# Patient Record
Sex: Female | Born: 1950 | Hispanic: No | Smoking: Never smoker
Health system: Southern US, Community
[De-identification: ages and names within clinical notes are randomized; demographics above are authoritative.]

## PROBLEM LIST (undated history)

## (undated) DIAGNOSIS — I639 Cerebral infarction, unspecified: Secondary | ICD-10-CM

## (undated) DIAGNOSIS — D649 Anemia, unspecified: Secondary | ICD-10-CM

## (undated) DIAGNOSIS — N189 Chronic kidney disease, unspecified: Secondary | ICD-10-CM

## (undated) DIAGNOSIS — K219 Gastro-esophageal reflux disease without esophagitis: Secondary | ICD-10-CM

## (undated) DIAGNOSIS — E119 Type 2 diabetes mellitus without complications: Secondary | ICD-10-CM

## (undated) HISTORY — PX: APPENDECTOMY: SHX54

## (undated) HISTORY — PX: EYE SURGERY: SHX253

---

## 2000-12-30 ENCOUNTER — Emergency Department (HOSPITAL_COMMUNITY): Admission: EM | Admit: 2000-12-30 | Discharge: 2000-12-30 | Payer: Self-pay | Admitting: *Deleted

## 2006-04-18 ENCOUNTER — Inpatient Hospital Stay (HOSPITAL_COMMUNITY): Admission: EM | Admit: 2006-04-18 | Discharge: 2006-04-20 | Payer: Self-pay | Admitting: Emergency Medicine

## 2014-11-22 DIAGNOSIS — E11341 Type 2 diabetes mellitus with severe nonproliferative diabetic retinopathy with macular edema: Secondary | ICD-10-CM | POA: Diagnosis not present

## 2015-01-05 DIAGNOSIS — E11341 Type 2 diabetes mellitus with severe nonproliferative diabetic retinopathy with macular edema: Secondary | ICD-10-CM | POA: Diagnosis not present

## 2015-01-10 DIAGNOSIS — E11341 Type 2 diabetes mellitus with severe nonproliferative diabetic retinopathy with macular edema: Secondary | ICD-10-CM | POA: Diagnosis not present

## 2015-01-31 DIAGNOSIS — E113512 Type 2 diabetes mellitus with proliferative diabetic retinopathy with macular edema, left eye: Secondary | ICD-10-CM | POA: Diagnosis not present

## 2015-02-15 DIAGNOSIS — E113411 Type 2 diabetes mellitus with severe nonproliferative diabetic retinopathy with macular edema, right eye: Secondary | ICD-10-CM | POA: Diagnosis not present

## 2015-02-20 ENCOUNTER — Encounter (HOSPITAL_COMMUNITY): Payer: Self-pay | Admitting: *Deleted

## 2015-02-20 ENCOUNTER — Emergency Department (HOSPITAL_COMMUNITY)
Admission: EM | Admit: 2015-02-20 | Discharge: 2015-02-20 | Disposition: A | Discharge status: Home / Self Care | Payer: Self-pay | Attending: Emergency Medicine | Admitting: Emergency Medicine

## 2015-02-20 ENCOUNTER — Emergency Department (HOSPITAL_COMMUNITY): Payer: Self-pay

## 2015-02-20 DIAGNOSIS — Y998 Other external cause status: Secondary | ICD-10-CM | POA: Insufficient documentation

## 2015-02-20 DIAGNOSIS — E119 Type 2 diabetes mellitus without complications: Secondary | ICD-10-CM | POA: Insufficient documentation

## 2015-02-20 DIAGNOSIS — S42301A Unspecified fracture of shaft of humerus, right arm, initial encounter for closed fracture: Secondary | ICD-10-CM | POA: Insufficient documentation

## 2015-02-20 DIAGNOSIS — W010XXA Fall on same level from slipping, tripping and stumbling without subsequent striking against object, initial encounter: Secondary | ICD-10-CM | POA: Insufficient documentation

## 2015-02-20 DIAGNOSIS — Y9289 Other specified places as the place of occurrence of the external cause: Secondary | ICD-10-CM | POA: Insufficient documentation

## 2015-02-20 DIAGNOSIS — Y9389 Activity, other specified: Secondary | ICD-10-CM | POA: Insufficient documentation

## 2015-02-20 DIAGNOSIS — S01111A Laceration without foreign body of right eyelid and periocular area, initial encounter: Secondary | ICD-10-CM | POA: Insufficient documentation

## 2015-02-20 HISTORY — DX: Type 2 diabetes mellitus without complications: E11.9

## 2015-02-20 LAB — URINALYSIS, ROUTINE W REFLEX MICROSCOPIC
BILIRUBIN URINE: NEGATIVE
Glucose, UA: >1000 mg/dL — AB
Ketones, ur: NEGATIVE mg/dL
Leukocytes, UA: NEGATIVE
NITRITE: NEGATIVE
SPECIFIC GRAVITY, URINE: 1.013 (ref 1.005–1.030)
UROBILINOGEN UA: 0.2 mg/dL (ref 0.0–1.0)
pH: 7 (ref 5.0–8.0)

## 2015-02-20 LAB — CBC WITH DIFFERENTIAL/PLATELET
BASOS ABS: 0 10*3/uL (ref 0.0–0.1)
BASOS PCT: 0 %
EOS ABS: 0.1 10*3/uL (ref 0.0–0.7)
Eosinophils Relative: 2 %
HEMATOCRIT: 30.3 % — AB (ref 36.0–46.0)
HEMOGLOBIN: 10 g/dL — AB (ref 12.0–15.0)
Lymphocytes Relative: 23 %
Lymphs Abs: 1.1 10*3/uL (ref 0.7–4.0)
MCH: 29.1 pg (ref 26.0–34.0)
MCHC: 33 g/dL (ref 30.0–36.0)
MCV: 88.1 fL (ref 78.0–100.0)
Monocytes Absolute: 0.3 10*3/uL (ref 0.1–1.0)
Monocytes Relative: 6 %
NEUTROS ABS: 3.4 10*3/uL (ref 1.7–7.7)
NEUTROS PCT: 69 %
Platelets: 333 10*3/uL (ref 150–400)
RBC: 3.44 MIL/uL — ABNORMAL LOW (ref 3.87–5.11)
RDW: 13.3 % (ref 11.5–15.5)
WBC: 4.9 10*3/uL (ref 4.0–10.5)

## 2015-02-20 LAB — BASIC METABOLIC PANEL
ANION GAP: 6 (ref 5–15)
BUN: 26 mg/dL — ABNORMAL HIGH (ref 6–20)
CALCIUM: 9 mg/dL (ref 8.9–10.3)
CHLORIDE: 110 mmol/L (ref 101–111)
CO2: 24 mmol/L (ref 22–32)
Creatinine, Ser: 1.32 mg/dL — ABNORMAL HIGH (ref 0.44–1.00)
GFR, EST AFRICAN AMERICAN: 48 mL/min — AB (ref >60)
GFR, EST NON AFRICAN AMERICAN: 41 mL/min — AB (ref >60)
Glucose, Bld: 228 mg/dL — ABNORMAL HIGH (ref 65–99)
Potassium: 4.4 mmol/L (ref 3.5–5.1)
Sodium: 140 mmol/L (ref 135–145)

## 2015-02-20 LAB — URINE MICROSCOPIC-ADD ON

## 2015-02-20 MED ORDER — TRAMADOL HCL 50 MG PO TABS: 50.0000 mg | ORAL_TABLET | Freq: Four times a day (QID) | ORAL | Status: DC | PRN

## 2015-02-20 MED ORDER — SODIUM CHLORIDE 0.9 % IV BOLUS (SEPSIS)
1000.0000 mL | Freq: Once | INTRAVENOUS | Status: AC
  Administered 2015-02-20: 1000 mL via INTRAVENOUS

## 2015-02-20 NOTE — Progress Notes (Signed)
Patient listed as not having insurance or a pcp.  EDCM spoke to patient at bedside.  Patient has adult family member at bedside translating as patient speaks [place].  Patient reports she has [employer] insurance.  Patient reports she has a pcp but she cannot remember her name.

## 2015-02-20 NOTE — ED Notes (Signed)
Pt arrives to the ER via EMS s/p fall at [facility]; pt states that she tripped over a rolling cart and caught herself with her rt arm; pt c/o rt upper arm pain; pt with small laceration to rt eye; pt denies LOC; pt with bruising to rt shin as well; pt with knot to rt wrist area, pt advises that this is an old injury from 971992; pt c/o pain to rt upper arm shoulder area, worse with movement; + pulse, + sensation

## 2015-02-20 NOTE — Discharge Instructions (Signed)
Fractura del hmero, tratada con inmovilizacin (Humerus Fracture Treated With Immobilization) El hmero es el hueso grande que se encuentra en la parte superior del brazo. Usted ha sufrido una ruptura de un hueso (fractura) en el hmero. Estas fracturas se diagnostican fcilmente con radiografas. TRATAMIENTO Las fracturas simples que se curan sin peligro de conducir a una discapacidad, se tratan con la simple inmovilizacin. Inmovilizacin significa que deber usar un yeso, una frula o un cabestrillo. Usted ha sufrido una fractura que se curar bien con inmovilizacin. La fractura se curar simplemente colocando el hueso en una buena posicin hasta que est lo suficientemente estable como para comenzar con los ejercicios de amplitud de movimientos. No realice actividades que puedan lesionar an ms el brazo.  INSTRUCCIONES PARA EL CUIDADO DOMICILIARIO  Aplique hielo sobre la zona lesionada.  Ponga el hielo en una bolsa plstica.  Colquese una toalla entre la piel y la bolsa de hielo.  Deje el hielo durante 15 a 20 minutos, 3 a 4 veces por da.  Si tiene un yeso:  No trate de rascarse la piel por debajo del molde utilizando objetos filosos o puntiagudos.  Controle todos los Darden Restaurantsdas la piel de alrededor del yeso. Puede colocarse una locin en las zonas rojas o doloridas.  Mantenga el yeso seco y limpio.  Si tiene una frula:  sela del modo en que se lo indicaron.  Mantenga la tablilla seco y limpio.  Puede aflojar el elstico que rodea la tablilla si los dedos se entumecen, siente hormigueos, se enfran o se vuelven de color azul.  Si tiene un cabestrillo:  Use el cabestrillo del modo en que se lo indicaron.  No ejerza presin en ninguna parte del yeso o tablilla hasta que se haya endurecido.  Puede proteger el yeso o la tablilla durante el bao con una bolsa plstica. No los sumerja en el agua.  Utilice los medicamentos de venta libre o de prescripcin para Chief Technology Officerel dolor, Copywriter, advertisingel  malestar o la [place]bre, segn se lo indique el profesional que lo asiste.  Haga ejercicios con amplitud de movimientos, segn se lo haya indicado el profesional.  Realice el seguimiento segn las instrucciones que le ha dado el profesional que lo asiste. Esto es muy importante para evitar una lesin o discapacidad permanente y el dolor crnico. SOLICITE ATENCIN MDICA DE INMEDIATO SI:  La piel o las uas del brazo lesionado se vuelven azules o grises.  Siente el brazo fro o entumecido.  Aumenta el dolor en la zona de la herida.  Tiene problemas con los medicamentos que le recetaron. EST SEGURO QUE:   Comprende las instrucciones para el alta mdica.  Controlar su enfermedad.  Solicitar atencin mdica de inmediato segn las indicaciones.   Esta informacin no tiene Theme park managercomo fin reemplazar el consejo del mdico. Asegrese de hacerle al mdico cualquier pregunta que tenga.   Document Released: 04/07/2005 Document Revised: 04/28/2014 Elsevier Interactive Patient Education [employer]2016 Elsevier Inc.

## 2015-02-20 NOTE — ED Provider Notes (Signed)
CSN: [id number]     Arrival date & time 02/20/15  1931 History   First MD Initiated Contact with Patient 02/20/15 1942     Chief Complaint  Patient presents with  . Fall  . Arm Pain     (Consider location/radiation/quality/duration/timing/severity/associated sxs/prior Treatment) HPI Patient presents after mechanical fall. Patient recalls the entirety of the event.  She states that she lost her balance, fell onto her right side. Since the event she has had no loss of consciousness, confusion, disorientation, weakness anywhere. She does have severe pain in the right shoulder. There is minimal pain in the right. Patient has not been ambulatory since the initial event.  No medication taken for pain relief. Past Medical History  Diagnosis Date  . Diabetes mellitus without complication [facility](HCC)    Past Surgical History  Procedure Laterality Date  . Eye surgery     No family history on file. Social History  Substance Use Topics  . Smoking status: Never Smoker   . Smokeless tobacco: None  . Alcohol Use: No   OB History    No data available     Review of Systems  Constitutional:       Per HPI, otherwise negative  HENT:       Per HPI, otherwise negative  Respiratory:       Per HPI, otherwise negative  Cardiovascular:       Per HPI, otherwise negative  Gastrointestinal: Negative for vomiting.  Endocrine:       Negative aside from HPI  Genitourinary:       Neg aside from HPI   Musculoskeletal:       Per HPI, otherwise negative  Skin: Positive for wound.  Neurological: Negative for syncope and weakness.      Allergies  Review of patient's allergies indicates no known allergies.  Home Medications   Prior to Admission medications   Not on File   BP 178/83 mmHg  Pulse 100  Temp(Src) 98 F (36.7 C) (Oral)  Resp 16  SpO2 99% Physical Exam  Constitutional: She is oriented to person, place, and time. She appears well-developed and well-nourished. No distress.    HENT:  Head: Normocephalic and atraumatic.  Eyes: Conjunctivae and EOM are normal.  Neck: No spinous process tenderness and no muscular tenderness present. No rigidity. No edema, no erythema and normal range of motion present.  Cardiovascular: Normal rate and regular rhythm.   Pulmonary/Chest: Effort normal and breath sounds normal. No stridor. No respiratory distress.  Abdominal: She exhibits no distension.  Musculoskeletal: She exhibits no edema.       Right elbow: Normal.      Arms: Neurological: She is alert and oriented to person, place, and time. No cranial nerve deficit.  Skin: Skin is warm and dry.     Psychiatric: She has a normal mood and affect.  Nursing note and vitals reviewed.   ED Course  Procedures (including critical care time) Labs Review Labs Reviewed  BASIC METABOLIC PANEL - Abnormal; Notable for the following:    Glucose, Bld 228 (*)    BUN 26 (*)    Creatinine, Ser 1.32 (*)    GFR calc non Af Amer 41 (*)    GFR calc Af Amer 48 (*)    All other components within normal limits  CBC WITH DIFFERENTIAL/PLATELET - Abnormal; Notable for the following:    RBC 3.44 (*)    Hemoglobin 10.0 (*)    HCT 30.3 (*)    All other  components within normal limits  URINALYSIS, ROUTINE W REFLEX MICROSCOPIC (NOT AT [facility])    Imaging Review Dg Shoulder Right  02/20/2015  CLINICAL DATA:  Trip and fall injury. Right upper arm pain with knot in the right wrist area. Old injury from 59. EXAM: RIGHT SHOULDER - 2+ VIEW COMPARISON:  None. FINDINGS: Limited two view shoulder series demonstrates acute fracture of the right humeral head and neck with fracture lines extending along the greater tuberosity and surgical neck of the humerus. There is probable impaction of fracture fragments with small displaced fragments. No definite dislocation of the shoulder. Visualized right ribs appear intact. IMPRESSION: Comminuted fracture of the proximal right humerus with impaction and mild  displacement of fracture fragments. Electronically Signed   By: Burman Nieves M.D.   On: 02/20/2015 20:52   Dg Wrist Complete Right  02/20/2015  CLINICAL DATA:  64 year old who fell at the stool or, tripping over a cart, landing on the outstretched right arm. Initial encounter. Remote right wrist injury in 1992. EXAM: RIGHT WRIST - COMPLETE 3+ VIEW COMPARISON:  None. FINDINGS: Prior solid osseous fusion of the entire carpus, the radiocarpal joint, and the 2nd through 5th carpal-metacarpal joints. No evidence of acute fracture or dislocation. As a result of the fusion, the ulna protrudes dorsally more than usual. IMPRESSION: 1. No acute osseous abnormality. 2. Prior solid right wrist fusion as described above. Electronically Signed   By: Hulan Saas M.D.   On: 02/20/2015 20:54   I have personally reviewed and evaluated these images and lab results as part of my medical decision-making.  On repeat exam the patient is in no distress we reviewed the x-rays together.  Patient received sling in the emergency department, this was well tolerated.  MDM  Patient presents after mechanical fall with pain in her right shoulder. Patient is found to have right proximal humerus fracture. Patient is awake, alert, neurologically intact 4 hours of monitoring here, and labs are largely reassuring, though there is some evidence for mild dehydration. Patient discharged in stable condition to follow-up with primary care in orthopedics.  Gerhard Munch, MD 02/20/15 2257

## 2015-02-27 DIAGNOSIS — S42291A Other displaced fracture of upper end of right humerus, initial encounter for closed fracture: Secondary | ICD-10-CM | POA: Diagnosis not present

## 2015-03-13 DIAGNOSIS — S42291D Other displaced fracture of upper end of right humerus, subsequent encounter for fracture with routine healing: Secondary | ICD-10-CM | POA: Diagnosis not present

## 2016-01-08 DIAGNOSIS — E113512 Type 2 diabetes mellitus with proliferative diabetic retinopathy with macular edema, left eye: Secondary | ICD-10-CM | POA: Diagnosis not present

## 2016-01-08 DIAGNOSIS — Z961 Presence of intraocular lens: Secondary | ICD-10-CM | POA: Diagnosis not present

## 2016-01-08 DIAGNOSIS — E113511 Type 2 diabetes mellitus with proliferative diabetic retinopathy with macular edema, right eye: Secondary | ICD-10-CM | POA: Diagnosis not present

## 2016-01-08 DIAGNOSIS — H359 Unspecified retinal disorder: Secondary | ICD-10-CM | POA: Diagnosis not present

## 2016-07-08 ENCOUNTER — Encounter (HOSPITAL_COMMUNITY): Payer: Self-pay | Admitting: *Deleted

## 2016-07-08 ENCOUNTER — Emergency Department (HOSPITAL_COMMUNITY)
Admission: EM | Admit: 2016-07-08 | Discharge: 2016-07-08 | Disposition: A | Discharge status: Home / Self Care | Payer: Medicare Other | Attending: Emergency Medicine | Admitting: Emergency Medicine

## 2016-07-08 DIAGNOSIS — E1165 Type 2 diabetes mellitus with hyperglycemia: Secondary | ICD-10-CM | POA: Insufficient documentation

## 2016-07-08 DIAGNOSIS — R739 Hyperglycemia, unspecified: Secondary | ICD-10-CM

## 2016-07-08 LAB — BASIC METABOLIC PANEL
Anion gap: 11 (ref 5–15)
BUN: 56 mg/dL — AB (ref 6–20)
CALCIUM: 8.6 mg/dL — AB (ref 8.9–10.3)
CO2: 21 mmol/L — AB (ref 22–32)
Chloride: 99 mmol/L — ABNORMAL LOW (ref 101–111)
Creatinine, Ser: 2.13 mg/dL — ABNORMAL HIGH (ref 0.44–1.00)
GFR calc Af Amer: 26 mL/min — ABNORMAL LOW (ref >60)
GFR, EST NON AFRICAN AMERICAN: 23 mL/min — AB (ref >60)
GLUCOSE: 670 mg/dL — AB (ref 65–99)
POTASSIUM: 4 mmol/L (ref 3.5–5.1)
Sodium: 131 mmol/L — ABNORMAL LOW (ref 135–145)

## 2016-07-08 LAB — URINALYSIS, ROUTINE W REFLEX MICROSCOPIC
Bilirubin Urine: NEGATIVE
Glucose, UA: >=500 mg/dL — AB
HGB URINE DIPSTICK: NEGATIVE
Ketones, ur: NEGATIVE mg/dL
Leukocytes, UA: NEGATIVE
NITRITE: NEGATIVE
SPECIFIC GRAVITY, URINE: 1.02 (ref 1.005–1.030)
pH: 6 (ref 5.0–8.0)

## 2016-07-08 LAB — I-STAT CHEM 8, ED
BUN: 54 mg/dL — ABNORMAL HIGH (ref 6–20)
CHLORIDE: 106 mmol/L (ref 101–111)
Calcium, Ion: 1.13 mmol/L — ABNORMAL LOW (ref 1.15–1.40)
Creatinine, Ser: 2 mg/dL — ABNORMAL HIGH (ref 0.44–1.00)
Glucose, Bld: 371 mg/dL — ABNORMAL HIGH (ref 65–99)
HEMATOCRIT: 21 % — AB (ref 36.0–46.0)
HEMOGLOBIN: 7.1 g/dL — AB (ref 12.0–15.0)
POTASSIUM: 4.1 mmol/L (ref 3.5–5.1)
SODIUM: 136 mmol/L (ref 135–145)
TCO2: 23 mmol/L (ref 0–100)

## 2016-07-08 LAB — CBC
HEMATOCRIT: 27.8 % — AB (ref 36.0–46.0)
Hemoglobin: 9.4 g/dL — ABNORMAL LOW (ref 12.0–15.0)
MCH: 28.2 pg (ref 26.0–34.0)
MCHC: 33.8 g/dL (ref 30.0–36.0)
MCV: 83.5 fL (ref 78.0–100.0)
Platelets: 383 10*3/uL (ref 150–400)
RBC: 3.33 MIL/uL — ABNORMAL LOW (ref 3.87–5.11)
RDW: 13 % (ref 11.5–15.5)
WBC: 7.4 10*3/uL (ref 4.0–10.5)

## 2016-07-08 LAB — CBG MONITORING, ED
GLUCOSE-CAPILLARY: 573 mg/dL — AB (ref 65–99)
GLUCOSE-CAPILLARY: 361 mg/dL — AB (ref 65–99)

## 2016-07-08 MED ORDER — SODIUM CHLORIDE 0.9 % IV BOLUS (SEPSIS)
2000.0000 mL | Freq: Once | INTRAVENOUS | Status: AC
  Administered 2016-07-08: 2000 mL via INTRAVENOUS

## 2016-07-08 MED ORDER — METFORMIN HCL 500 MG PO TABS: 500.0000 mg | ORAL_TABLET | Freq: Two times a day (BID) | ORAL | 0 refills | Status: DC

## 2016-07-08 MED ORDER — INSULIN ASPART 100 UNIT/ML ~~LOC~~ SOLN
10.0000 [IU] | Freq: Once | SUBCUTANEOUS | Status: AC
  Administered 2016-07-08: 10 [IU] via INTRAVENOUS
  Filled 2016-07-08: qty 1

## 2016-07-08 NOTE — ED Triage Notes (Signed)
Daughter states patient went to the PCP yest for routine check up and had labs drawn, was called this am and told to come to the hospital for admission for elevated blood sugar. Patient doesn't speak any english

## 2016-07-08 NOTE — ED Notes (Signed)
CBG 361 

## 2016-07-08 NOTE — ED Notes (Signed)
Checked patient blood sugar it read over 500 notified RN Tresa EndoKelly

## 2016-07-08 NOTE — ED Provider Notes (Signed)
[facility]-EMERGENCY DEPT Provider Note   CSN: [id number] Arrival date & time: 07/08/16  4098     History   Chief Complaint Chief Complaint  Patient presents with  . Hyperglycemia    HPI Frances Reeves is a 66 y.o. female.  HPI  Patient presents with concern of hyperglycemia. Patient states that she has one medical problem, diabetes. Patient has not taken metformin in about one year, since social circumstances changed. Today the patient had a clinic appointment, arranged by social services. She is found be hyper glycemic there, sent here for evaluation. She denies any ongoing complaints, including fatigue, nausea, confusion, vomiting, diarrhea, fever.   Past Medical History:  Diagnosis Date  . Diabetes mellitus without complication (HCC)     There are no active problems to display for this patient.   Past Surgical History:  Procedure Laterality Date  . EYE SURGERY      OB History    No data available       Home Medications    Prior to Admission medications   Medication Sig Start Date End Date Taking? Authorizing Provider  metFORMIN (GLUCOPHAGE) 500 MG tablet Take 500 mg by mouth 2 (two) times daily.    Historical Provider, MD  traMADol (ULTRAM) 50 MG tablet Take 1 tablet (50 mg total) by mouth every 6 (six) hours as needed. Patient not taking: Reported on 07/08/2016 02/20/15   Gerhard Munch, MD    Family History History reviewed. No pertinent family history.  Social History Social History  Substance Use Topics  . Smoking status: Never Smoker  . Smokeless tobacco: Never Used  . Alcohol use No     Allergies   Patient has no known allergies.   Review of Systems Review of Systems  Constitutional:       Per HPI, otherwise negative  HENT:       Per HPI, otherwise negative  Respiratory:       Per HPI, otherwise negative  Cardiovascular:       Per HPI, otherwise negative  Gastrointestinal: Negative for vomiting.  Endocrine:       Negative  aside from HPI  Genitourinary:       Neg aside from HPI   Musculoskeletal:       Per HPI, otherwise negative  Skin: Negative.   Neurological: Negative for syncope.     Physical Exam Updated Vital Signs BP (!) 163/62   Pulse 97   Temp 98.8 F (37.1 C) (Oral)   Resp (!) 21   Wt 90 lb (40.8 kg)   SpO2 100%   Physical Exam  Constitutional: She is oriented to person, place, and time. No distress.  Thin elderly female awake, alert, speaking clearly, answering questions appropriately  HENT:  Head: Normocephalic and atraumatic.  Eyes: Conjunctivae and EOM are normal.  Cardiovascular: Normal rate and regular rhythm.   Pulmonary/Chest: Effort normal and breath sounds normal. No stridor. No respiratory distress.  Abdominal: She exhibits no distension.  Musculoskeletal: She exhibits no edema.  Neurological: She is alert and oriented to person, place, and time. No cranial nerve deficit.  Skin: Skin is warm and dry.  Psychiatric: She has a normal mood and affect.  Nursing note and vitals reviewed.    ED Treatments / Results  Labs (all labs ordered are listed, but only abnormal results are displayed) Labs Reviewed  BASIC METABOLIC PANEL - Abnormal; Notable for the following:       Result Value   Sodium 131 (*)  Chloride 99 (*)    CO2 21 (*)    Glucose, Bld 670 (*)    BUN 56 (*)    Creatinine, Ser 2.13 (*)    Calcium 8.6 (*)    GFR calc non Af Amer 23 (*)    GFR calc Af Amer 26 (*)    All other components within normal limits  CBC - Abnormal; Notable for the following:    RBC 3.33 (*)    Hemoglobin 9.4 (*)    HCT 27.8 (*)    All other components within normal limits  CBG MONITORING, ED - Abnormal; Notable for the following:    Glucose-Capillary 573 (*)    All other components within normal limits  URINALYSIS, ROUTINE W REFLEX MICROSCOPIC  CBG MONITORING, ED   Procedures Procedures (including critical care time)  Medications Ordered in ED Medications  sodium  chloride 0.9 % bolus 2,000 mL (not administered)  insulin aspart (novoLOG) injection 10 Units (not administered)   Initial labs notable for glucose 670, creatinine 2.13. Patient received fluid resuscitation, insulin. On repeat exam the patient is awake and alert, states that she feels substantially better. Glucose has decreased by half, she is eating lunch, in no distress.  Update: Patient continues to feel better. Daughter now present. With the daughter present with discussed the need to initiate metformin therapy, follow-up with her physician in 2 weeks for repeat evaluation.  Given the substantial improvement, no evidence for DKA, and with appropriate fluid resuscitation with 2 L, patient discharged in stable condition.   Initial Impression / Assessment and Plan / ED Course  I have reviewed the triage vital signs and the nursing notes.  Pertinent labs & imaging results that were available during my care of the patient were reviewed by me and considered in my medical decision making (see chart for details).    Final Clinical Impressions(s) / ED Diagnoses   Final diagnoses:  Hyperglycemia   Discharge with metformin   Gerhard Munchobert Shawnette Augello, MD 07/08/16 [phone]

## 2016-07-08 NOTE — Discharge Instructions (Signed)
As discussed, it is important that you take your medication as directed, and be sure to follow-up with your physician for repeat evaluation in 2 weeks.    Please be sure to stay well hydrated, and well-nourished.  Return here for concerning changes in your condition.

## 2016-07-08 NOTE — ED Notes (Signed)
Pt did not need anything at this time  

## 2016-07-08 NOTE — ED Notes (Signed)
Ernestine ConradEvelyn Kwak [phone]

## 2016-07-08 NOTE — ED Notes (Signed)
PT did not have to use RR at this time  

## 2016-11-19 ENCOUNTER — Encounter: Payer: Self-pay | Admitting: Vascular Surgery

## 2016-11-19 ENCOUNTER — Other Ambulatory Visit: Payer: Self-pay

## 2016-11-19 DIAGNOSIS — N185 Chronic kidney disease, stage 5: Secondary | ICD-10-CM

## 2016-11-20 ENCOUNTER — Encounter: Payer: Medicare Other | Admitting: Vascular Surgery

## 2016-11-20 ENCOUNTER — Other Ambulatory Visit: Payer: Self-pay | Admitting: Nephrology

## 2016-11-20 ENCOUNTER — Encounter (HOSPITAL_COMMUNITY): Payer: Medicare Other

## 2016-11-20 ENCOUNTER — Inpatient Hospital Stay (HOSPITAL_COMMUNITY): Admission: RE | Admit: 2016-11-20 | Payer: Medicare Other | Source: Ambulatory Visit

## 2016-11-20 DIAGNOSIS — N189 Chronic kidney disease, unspecified: Secondary | ICD-10-CM

## 2016-11-21 ENCOUNTER — Other Ambulatory Visit: Payer: Self-pay

## 2016-11-21 ENCOUNTER — Ambulatory Visit (HOSPITAL_COMMUNITY)
Admission: RE | Admit: 2016-11-21 | Discharge: 2016-11-21 | Disposition: A | Discharge status: Home / Self Care | Payer: Medicare Other | Source: Ambulatory Visit | Attending: Vascular Surgery | Admitting: Vascular Surgery

## 2016-11-21 ENCOUNTER — Encounter (HOSPITAL_COMMUNITY): Payer: Self-pay | Admitting: *Deleted

## 2016-11-21 ENCOUNTER — Encounter: Payer: Self-pay | Admitting: Vascular Surgery

## 2016-11-21 ENCOUNTER — Ambulatory Visit (INDEPENDENT_AMBULATORY_CARE_PROVIDER_SITE_OTHER)
Admission: RE | Admit: 2016-11-21 | Discharge: 2016-11-21 | Disposition: A | Discharge status: Home / Self Care | Payer: Medicare Other | Source: Ambulatory Visit | Attending: Vascular Surgery | Admitting: Vascular Surgery

## 2016-11-21 ENCOUNTER — Ambulatory Visit (INDEPENDENT_AMBULATORY_CARE_PROVIDER_SITE_OTHER): Payer: Medicare Other | Admitting: Vascular Surgery

## 2016-11-21 VITALS — BP 160/80 | HR 102 | Temp 96.9°F | Resp 16 | Ht 60.5 in | Wt 98.8 lb

## 2016-11-21 DIAGNOSIS — Z992 Dependence on renal dialysis: Secondary | ICD-10-CM | POA: Diagnosis not present

## 2016-11-21 DIAGNOSIS — N186 End stage renal disease: Secondary | ICD-10-CM | POA: Diagnosis not present

## 2016-11-21 DIAGNOSIS — N185 Chronic kidney disease, stage 5: Secondary | ICD-10-CM | POA: Insufficient documentation

## 2016-11-21 DIAGNOSIS — Z01818 Encounter for other preprocedural examination: Secondary | ICD-10-CM | POA: Diagnosis not present

## 2016-11-21 DIAGNOSIS — Z48812 Encounter for surgical aftercare following surgery on the circulatory system: Secondary | ICD-10-CM

## 2016-11-21 NOTE — Progress Notes (Signed)
Spoke with pt's daughter, [name]. She states pt speaks Spanish and she usually speaks for her mother, plus mother's phone not working. Renea Eevelyn states pt is diabetic, but does not know what her fasting blood sugar usually is or what her A1C is. She states pt does not have any cardiac history. I did instruct her to have pt check her blood sugar when she gets up Monday AM and every 2 hours until she leaves for the hospital. If blood sugar is 70 or below, treat with 1/2 cup of clear juice (apple or cranberry) and recheck blood sugar 15 minutes after drinking juice. If blood sugar continues to be 70 or below, call the Short Stay department and ask to speak to a nurse. She voiced understanding.  I called [phone] for a Spanish interpreter and spoke with Darel HongJudy. She states she will arrange for an interpreter to be here at 8:45 AM.

## 2016-11-21 NOTE — Progress Notes (Signed)
   Patient ID: Frances Reeves, female   DOB: 09/02/1950, 66 y.o.   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  Dr. Elvis CoilMartin Webb.)   Referred by No ref. provider found  Subjective:     HPI:  Frances Reeves is a 66 y.o. female sent urgently for evaluation for dialysis access. She is right-hand dominant. She has never had dialysis before. She has been treated for hyperkalemia in the recent past. She does not have bilateral upper extremity surgery in the past. She does not have any DVT that she is aware of she is not on blood thinners.  Past Medical History:  Diagnosis Date  . Diabetes mellitus without complication (HCC)    No family history on file. Past Surgical History:  Procedure Laterality Date  . EYE SURGERY      Short Social History:  Social History  Substance Use Topics  . Smoking status: Never Smoker  . Smokeless tobacco: Never Used  . Alcohol use No    No Known Allergies  Current Outpatient Prescriptions  Medication Sig Dispense Refill  . metFORMIN (GLUCOPHAGE) 500 MG tablet Take 1 tablet (500 mg total) by mouth 2 (two) times daily. 60 tablet 0  . traMADol (ULTRAM) 50 MG tablet Take 1 tablet (50 mg total) by mouth every 6 (six) hours as needed. 15 tablet 0   No current facility-administered medications for this visit.     Review of Systems  Constitutional:  Constitutional negative. Eyes: Positive for loss of vision.   Respiratory: Positive for cough.  Cardiovascular: Positive for leg swelling.  GI: Gastrointestinal negative.  Musculoskeletal: Musculoskeletal negative.  Skin: Positive for rash.  Neurological: Positive for dizziness.  Hematologic: Hematologic/lymphatic negative.  Psychiatric: Psychiatric negative.        Objective:  Objective   Vitals:   11/21/16 1459  BP: (!) 160/80  Pulse: (!) 102  Resp: 16  Temp: (!) 96.9 F (36.1 C)  TempSrc: Oral  SpO2: 99%  Weight: 98 lb 12.8 oz (44.8 kg)    Height: 5' 0.5" (1.537 m)   Body mass index is 18.98 kg/m.  Physical Exam  Constitutional: She is oriented to person, place, and time. She appears well-developed.  HENT:  Head: Normocephalic.  Eyes: Pupils are equal, round, and reactive to light.  Neck: Normal range of motion.  Cardiovascular: Normal rate.   Pulses:      Radial pulses are 2+ on the right side, and 2+ on the left side.  Pulmonary/Chest: Effort normal.  Abdominal: Soft. She exhibits no mass.  Musculoskeletal: Normal range of motion. She exhibits no edema.  Neurological: She is alert and oriented to person, place, and time.  Skin: Skin is warm and dry.  Psychiatric: Her behavior is normal. Thought content normal.    Data: Shari Prowsvan apparently interpreted her vein mapping which demonstrates suitable basilic vein in the left arm. I've also interpreted her bilateral upper extremity arterial duplex where she has a 0.36 cm diameter brachial artery on the left.     Assessment/Plan:     66 year old female who is primarily Spanish-speaking presents for dialysis access. She needs access on a relatively urgent basis and we will get her scheduled early next week for catheter and left upper extremity fistula versus graft. She demonstrates good understanding via interpretation by her daughter.     Maeola HarmanBrandon Christopher Khadejah Son MD [facility]

## 2016-11-24 ENCOUNTER — Ambulatory Visit (HOSPITAL_COMMUNITY): Payer: Medicare Other

## 2016-11-24 ENCOUNTER — Ambulatory Visit (HOSPITAL_COMMUNITY): Payer: Medicare Other | Admitting: Certified Registered Nurse Anesthetist

## 2016-11-24 ENCOUNTER — Encounter (HOSPITAL_COMMUNITY)
Admission: RE | Disposition: A | Discharge status: Home / Self Care | Payer: Self-pay | Source: Ambulatory Visit | Attending: Vascular Surgery

## 2016-11-24 ENCOUNTER — Encounter (HOSPITAL_COMMUNITY): Payer: Self-pay | Admitting: *Deleted

## 2016-11-24 ENCOUNTER — Ambulatory Visit (HOSPITAL_COMMUNITY)
Admission: RE | Admit: 2016-11-24 | Discharge: 2016-11-24 | Disposition: A | Discharge status: Home / Self Care | Payer: Medicare Other | Source: Ambulatory Visit | Attending: Vascular Surgery | Admitting: Vascular Surgery

## 2016-11-24 ENCOUNTER — Encounter: Payer: Self-pay | Admitting: Nephrology

## 2016-11-24 DIAGNOSIS — Z8673 Personal history of transient ischemic attack (TIA), and cerebral infarction without residual deficits: Secondary | ICD-10-CM | POA: Insufficient documentation

## 2016-11-24 DIAGNOSIS — I120 Hypertensive chronic kidney disease with stage 5 chronic kidney disease or end stage renal disease: Secondary | ICD-10-CM | POA: Insufficient documentation

## 2016-11-24 DIAGNOSIS — N185 Chronic kidney disease, stage 5: Secondary | ICD-10-CM | POA: Diagnosis not present

## 2016-11-24 DIAGNOSIS — Z79899 Other long term (current) drug therapy: Secondary | ICD-10-CM | POA: Insufficient documentation

## 2016-11-24 DIAGNOSIS — E1122 Type 2 diabetes mellitus with diabetic chronic kidney disease: Secondary | ICD-10-CM | POA: Insufficient documentation

## 2016-11-24 DIAGNOSIS — Z419 Encounter for procedure for purposes other than remedying health state, unspecified: Secondary | ICD-10-CM

## 2016-11-24 DIAGNOSIS — N186 End stage renal disease: Secondary | ICD-10-CM | POA: Insufficient documentation

## 2016-11-24 DIAGNOSIS — Z7984 Long term (current) use of oral hypoglycemic drugs: Secondary | ICD-10-CM | POA: Insufficient documentation

## 2016-11-24 DIAGNOSIS — Z992 Dependence on renal dialysis: Secondary | ICD-10-CM | POA: Insufficient documentation

## 2016-11-24 DIAGNOSIS — Z95828 Presence of other vascular implants and grafts: Secondary | ICD-10-CM

## 2016-11-24 DIAGNOSIS — K219 Gastro-esophageal reflux disease without esophagitis: Secondary | ICD-10-CM | POA: Insufficient documentation

## 2016-11-24 HISTORY — PX: INSERTION OF DIALYSIS CATHETER: SHX1324

## 2016-11-24 HISTORY — DX: Anemia, unspecified: D64.9

## 2016-11-24 HISTORY — PX: BASCILIC VEIN TRANSPOSITION: SHX5742

## 2016-11-24 HISTORY — DX: Chronic kidney disease, unspecified: N18.9

## 2016-11-24 HISTORY — DX: Cerebral infarction, unspecified: I63.9

## 2016-11-24 HISTORY — DX: Gastro-esophageal reflux disease without esophagitis: K21.9

## 2016-11-24 LAB — GLUCOSE, CAPILLARY
GLUCOSE-CAPILLARY: 71 mg/dL (ref 65–99)
GLUCOSE-CAPILLARY: 77 mg/dL (ref 65–99)

## 2016-11-24 LAB — POCT I-STAT 4, (NA,K, GLUC, HGB,HCT)
Glucose, Bld: 79 mg/dL (ref 65–99)
HEMATOCRIT: 22 % — AB (ref 36.0–46.0)
Hemoglobin: 7.5 g/dL — ABNORMAL LOW (ref 12.0–15.0)
Potassium: 5.5 mmol/L — ABNORMAL HIGH (ref 3.5–5.1)
SODIUM: 140 mmol/L (ref 135–145)

## 2016-11-24 SURGERY — TRANSPOSITION, VEIN, BASILIC
Anesthesia: General | Site: Neck | Laterality: Right

## 2016-11-24 MED ORDER — PHENYLEPHRINE HCL 10 MG/ML IJ SOLN
INTRAVENOUS | Status: DC | PRN
  Administered 2016-11-24: 40 ug/min via INTRAVENOUS

## 2016-11-24 MED ORDER — PROPOFOL 10 MG/ML IV BOLUS
INTRAVENOUS | Status: DC | PRN
  Administered 2016-11-24: 100 mg via INTRAVENOUS

## 2016-11-24 MED ORDER — FENTANYL CITRATE (PF) 100 MCG/2ML IJ SOLN
INTRAMUSCULAR | Status: DC | PRN
  Administered 2016-11-24 (×4): 25 ug via INTRAVENOUS

## 2016-11-24 MED ORDER — MIDAZOLAM HCL 5 MG/5ML IJ SOLN
INTRAMUSCULAR | Status: DC | PRN
  Administered 2016-11-24: 2 mg via INTRAVENOUS

## 2016-11-24 MED ORDER — 0.9 % SODIUM CHLORIDE (POUR BTL) OPTIME
TOPICAL | Status: DC | PRN
  Administered 2016-11-24: 1000 mL

## 2016-11-24 MED ORDER — MIDAZOLAM HCL 2 MG/2ML IJ SOLN
INTRAMUSCULAR | Status: AC
  Filled 2016-11-24: qty 2

## 2016-11-24 MED ORDER — CHLORHEXIDINE GLUCONATE CLOTH 2 % EX PADS: 6.0000 | MEDICATED_PAD | Freq: Once | CUTANEOUS | Status: DC

## 2016-11-24 MED ORDER — DEXTROSE 5 % IV SOLN
1.5000 g | INTRAVENOUS | Status: AC
  Administered 2016-11-24: 1.5 g via INTRAVENOUS
  Filled 2016-11-24: qty 1.5

## 2016-11-24 MED ORDER — SODIUM CHLORIDE 0.9 % IV SOLN
INTRAVENOUS | Status: DC | PRN
  Administered 2016-11-24: 14:00:00 500 mL

## 2016-11-24 MED ORDER — PROPOFOL 10 MG/ML IV BOLUS
INTRAVENOUS | Status: AC
  Filled 2016-11-24: qty 20

## 2016-11-24 MED ORDER — HEPARIN SODIUM (PORCINE) 1000 UNIT/ML IJ SOLN
INTRAMUSCULAR | Status: AC
  Filled 2016-11-24: qty 1

## 2016-11-24 MED ORDER — OXYCODONE-ACETAMINOPHEN 5-325 MG PO TABS: 1.0000 | ORAL_TABLET | Freq: Four times a day (QID) | ORAL | 0 refills | Status: DC | PRN

## 2016-11-24 MED ORDER — PHENYLEPHRINE HCL 10 MG/ML IJ SOLN
INTRAMUSCULAR | Status: DC | PRN
  Administered 2016-11-24 (×3): 80 ug via INTRAVENOUS

## 2016-11-24 MED ORDER — LIDOCAINE HCL (CARDIAC) 20 MG/ML IV SOLN
INTRAVENOUS | Status: DC | PRN
  Administered 2016-11-24: 40 mg via INTRAVENOUS

## 2016-11-24 MED ORDER — HEPARIN SODIUM (PORCINE) 1000 UNIT/ML IJ SOLN
INTRAMUSCULAR | Status: DC | PRN
  Administered 2016-11-24: 3000 [IU]

## 2016-11-24 MED ORDER — FENTANYL CITRATE (PF) 100 MCG/2ML IJ SOLN: 25.0000 ug | INTRAMUSCULAR | Status: DC | PRN

## 2016-11-24 MED ORDER — ONDANSETRON HCL 4 MG/2ML IJ SOLN
INTRAMUSCULAR | Status: DC | PRN
  Administered 2016-11-24: 4 mg via INTRAVENOUS

## 2016-11-24 MED ORDER — FENTANYL CITRATE (PF) 250 MCG/5ML IJ SOLN
INTRAMUSCULAR | Status: AC
  Filled 2016-11-24: qty 5

## 2016-11-24 MED ORDER — SODIUM CHLORIDE 0.9 % IV SOLN
INTRAVENOUS | Status: DC
  Administered 2016-11-24 (×3): via INTRAVENOUS

## 2016-11-24 SURGICAL SUPPLY — 63 items
ADH SKN CLS APL DERMABOND .7 (GAUZE/BANDAGES/DRESSINGS) ×6
ARMBAND PINK RESTRICT EXTREMIT (MISCELLANEOUS) ×4 IMPLANT
BAG DECANTER FOR FLEXI CONT (MISCELLANEOUS) ×4 IMPLANT
BIOPATCH RED 1 DISK 7.0 (GAUZE/BANDAGES/DRESSINGS) ×3 IMPLANT
BIOPATCH RED 1IN DISK 7.0MM (GAUZE/BANDAGES/DRESSINGS) ×1
CANISTER SUCT 3000ML PPV (MISCELLANEOUS) ×4 IMPLANT
CATH PALINDROME RT-P 15FX19CM (CATHETERS) ×2 IMPLANT
CATH PALINDROME RT-P 15FX23CM (CATHETERS) IMPLANT
CATH PALINDROME RT-P 15FX28CM (CATHETERS) IMPLANT
CATH PALINDROME RT-P 15FX55CM (CATHETERS) IMPLANT
CLIP VESOCCLUDE MED 24/CT (CLIP) IMPLANT
CLIP VESOCCLUDE MED 6/CT (CLIP) ×3 IMPLANT
CLIP VESOCCLUDE SM WIDE 24/CT (CLIP) ×2 IMPLANT
CLIP VESOCCLUDE SM WIDE 6/CT (CLIP) IMPLANT
COVER PROBE W GEL 5X96 (DRAPES) ×7 IMPLANT
COVER SURGICAL LIGHT HANDLE (MISCELLANEOUS) ×4 IMPLANT
DERMABOND ADVANCED (GAUZE/BANDAGES/DRESSINGS) ×6
DERMABOND ADVANCED .7 DNX12 (GAUZE/BANDAGES/DRESSINGS) ×2 IMPLANT
DRAPE C-ARM 42X72 X-RAY (DRAPES) ×4 IMPLANT
DRAPE C-ARM MINI 42X72 WSTRAPS (DRAPES) ×3 IMPLANT
DRAPE CHEST BREAST 15X10 FENES (DRAPES) ×4 IMPLANT
ELECT REM PT RETURN 9FT ADLT (ELECTROSURGICAL) ×4
ELECTRODE REM PT RTRN 9FT ADLT (ELECTROSURGICAL) ×2 IMPLANT
GAUZE SPONGE 2X2 8PLY STRL LF (GAUZE/BANDAGES/DRESSINGS) ×1 IMPLANT
GAUZE SPONGE 4X4 16PLY XRAY LF (GAUZE/BANDAGES/DRESSINGS) ×4 IMPLANT
GLOVE BIO SURGEON STRL SZ 6.5 (GLOVE) ×3 IMPLANT
GLOVE BIO SURGEON STRL SZ7.5 (GLOVE) ×4 IMPLANT
GLOVE BIO SURGEONS STRL SZ 6.5 (GLOVE) ×1
GLOVE BIOGEL PI IND STRL 6.5 (GLOVE) IMPLANT
GLOVE BIOGEL PI INDICATOR 6.5 (GLOVE) ×4
GLOVE SURG SS PI 6.5 STRL IVOR (GLOVE) ×2 IMPLANT
GOWN STRL NON-REIN LRG LVL3 (GOWN DISPOSABLE) ×3 IMPLANT
GOWN STRL REUS W/ TWL LRG LVL3 (GOWN DISPOSABLE) ×6 IMPLANT
GOWN STRL REUS W/ TWL XL LVL3 (GOWN DISPOSABLE) ×2 IMPLANT
GOWN STRL REUS W/TWL LRG LVL3 (GOWN DISPOSABLE) ×16
GOWN STRL REUS W/TWL XL LVL3 (GOWN DISPOSABLE) ×4
KIT BASIN OR (CUSTOM PROCEDURE TRAY) ×4 IMPLANT
KIT ROOM TURNOVER OR (KITS) ×4 IMPLANT
NDL 18GX1X1/2 (RX/OR ONLY) (NEEDLE) ×1 IMPLANT
NDL HYPO 25GX1X1/2 BEV (NEEDLE) ×1 IMPLANT
NEEDLE 18GX1X1/2 (RX/OR ONLY) (NEEDLE) ×4 IMPLANT
NEEDLE HYPO 25GX1X1/2 BEV (NEEDLE) ×4 IMPLANT
NS IRRIG 1000ML POUR BTL (IV SOLUTION) ×4 IMPLANT
PACK CV ACCESS (CUSTOM PROCEDURE TRAY) ×4 IMPLANT
PACK SURGICAL SETUP 50X90 (CUSTOM PROCEDURE TRAY) ×4 IMPLANT
PAD ARMBOARD 7.5X6 YLW CONV (MISCELLANEOUS) ×8 IMPLANT
SOAP 2 % CHG 4 OZ (WOUND CARE) ×4 IMPLANT
SPONGE GAUZE 2X2 STER 10/PKG (GAUZE/BANDAGES/DRESSINGS) ×2
SUT ETHILON 3 0 PS 1 (SUTURE) ×4 IMPLANT
SUT MNCRL AB 4-0 PS2 18 (SUTURE) ×4 IMPLANT
SUT PROLENE 6 0 BV (SUTURE) ×10 IMPLANT
SUT SILK 2 0 SH (SUTURE) IMPLANT
SUT VIC AB 3-0 SH 27 (SUTURE) ×4
SUT VIC AB 3-0 SH 27X BRD (SUTURE) ×2 IMPLANT
SYR 10ML LL (SYRINGE) ×4 IMPLANT
SYR 20CC LL (SYRINGE) ×8 IMPLANT
SYR 5ML LL (SYRINGE) ×4 IMPLANT
SYR CONTROL 10ML LL (SYRINGE) ×4 IMPLANT
TAPE CLOTH SURG 4X10 WHT LF (GAUZE/BANDAGES/DRESSINGS) ×3 IMPLANT
TOWEL GREEN STERILE FF (TOWEL DISPOSABLE) ×4 IMPLANT
TOWEL OR 17X26 4PK STRL BLUE (TOWEL DISPOSABLE) ×4 IMPLANT
UNDERPAD 30X30 (UNDERPADS AND DIAPERS) ×4 IMPLANT
WATER STERILE IRR 1000ML POUR (IV SOLUTION) ×4 IMPLANT

## 2016-11-24 NOTE — Progress Notes (Signed)
Patient's blood pressure 210/85.  Patient denies being in pain and states that she does not take anything for blood pressure at home.  Dr. Glade Stanford Fitzgerald notified.  Will continue to monitor patient

## 2016-11-24 NOTE — Transfer of Care (Signed)
Immediate Anesthesia Transfer of Care Note  Patient: Frances Reeves  Procedure(s) Performed: Procedure(s): FIRST STAGE BASCILIC VEIN TRANSPOSITION (Left) INSERTION OF TUNNELED DIALYSIS CATHETER (Right)  Patient Location: PACU  Anesthesia Type:General  Level of Consciousness: awake, alert  and oriented  Airway & Oxygen Therapy: Patient Spontanous Breathing and Patient connected to nasal cannula oxygen  Post-op Assessment: Report given to RN and Post -op Vital signs reviewed and stable  Post vital signs: Reviewed and stable  Last Vitals:  Vitals:   11/24/16 1458 11/24/16 1500  BP: (!) 154/66   Pulse: 83 82  Resp: 12 17  Temp: (!) 36.1 C     Last Pain:  Vitals:   11/24/16 1029  TempSrc: Oral      Patients Stated Pain Goal: 2 (11/24/16 1100)  Complications: No apparent anesthesia complications

## 2016-11-24 NOTE — H&P (Signed)
   History and Physical Update  The patient was interviewed and re-examined.  The patient's previous History and Physical has been reviewed and is unchanged from recent office visit. Plan for left arm avf vs avg and tdc placment.   Erven Ramson C. Randie Heinzain, MD [facility] Office: [phone] Pager: [phone]   11/24/2016, 10:36 AM

## 2016-11-24 NOTE — Anesthesia Preprocedure Evaluation (Addendum)
Anesthesia Evaluation  Patient identified by MRN, date of birth, ID band Patient awake    Reviewed: Allergy & Precautions, NPO status , Patient's Chart, lab work & pertinent test results  Airway Mallampati: III  TM Distance: <3 FB Neck ROM: Full    Dental  (+) Dental Advisory Given   Pulmonary neg pulmonary ROS,    breath sounds clear to auscultation       Cardiovascular hypertension (Uncontrolled HTN with need for urgent dialysis),  Rhythm:Regular Rate:Normal     Neuro/Psych CVA    GI/Hepatic Neg liver ROS, GERD  ,  Endo/Other  diabetes, Type 2, Oral Hypoglycemic Agents  Renal/GU ESRFRenal disease     Musculoskeletal   Abdominal   Peds  Hematology  (+) anemia ,   Anesthesia Other Findings   Reproductive/Obstetrics                            Lab Results  Component Value Date   WBC 7.4 07/08/2016   HGB 7.5 (L) 11/24/2016   HCT 22.0 (L) 11/24/2016   MCV 83.5 07/08/2016   PLT 383 07/08/2016   Lab Results  Component Value Date   CREATININE 2.00 (H) 07/08/2016   BUN 54 (H) 07/08/2016   NA 140 11/24/2016   K 5.5 (H) 11/24/2016   CL 106 07/08/2016   CO2 21 (L) 07/08/2016    Anesthesia Physical Anesthesia Plan  ASA: IV  Anesthesia Plan: General   Post-op Pain Management:    Induction: Intravenous  PONV Risk Score and Plan: 3 and Ondansetron, Dexamethasone and Treatment may vary due to age or medical condition  Airway Management Planned: LMA  Additional Equipment:   Intra-op Plan:   Post-operative Plan: Extubation in OR  Informed Consent: I have reviewed the patients History and Physical, chart, labs and discussed the procedure including the risks, benefits and alternatives for the proposed anesthesia with the patient or authorized representative who has indicated his/her understanding and acceptance.   Dental advisory given  Plan Discussed with: CRNA  Anesthesia Plan  Comments:        Anesthesia Quick Evaluation

## 2016-11-24 NOTE — Op Note (Signed)
    Patient name: Frances Reeves MRN: [id number] DOB: 09-Apr-1951 Sex: female  11/24/2016 Pre-operative Diagnosis: esrd Post-operative diagnosis:  Same Surgeon:  Erlene Quan C. Donzetta Matters, MD Procedure Performed: 1.  Right IJ tunneled dialysis catheter placement with US guidance 2.  Left upper arm brachio-cephalic avf creation  Indications:  66 year old female who has an imminent need for dialysis and is indicated for catheter placement as well as fistula versus graft. She is understanding of the risks benefits and alternatives via interpreter and is willing to proceed  Findings: The IJ was grossly patent and compressible by ultrasound. Retrograde cannulation or was placed in the IVC and 19 cm catheter placed termination of the SVC.  The left arm had suitable basilic and cephalic veins by ultrasound. Cephalic vein was dilated to 3.5 mm and at completion there was a palpable thrill in the runoff vein as well as palpable radial artery pulse at the wrist.   Procedure:  The patient was identified in the holding area and taken to the operating room where she was placed supine on the operating table Mac anesthesia induced. She was sterilely prepped and draped in the right neck and chest area in the usual fashion timeout called. Antibiotics weren't administered prior to starting. We identified the internal jugular vein on the right with ultrasound noted to be patent with single valve in place. We then cannulated this with 18-gauge needle passed a wire under fluoroscopic guidance into the IVC. Counter incision was made a tunnel created. A 19 cm cath was brought to the field flushed from the size and tunneled. The wire tract was serially dilated and the introducer placed under fluoroscopic guidance. Catheter was then introduced and the sheath peeled away. Catheter was then trimmed to size flushed and assembled. Fluoroscopic guidance demonstrated smooth curve termination of the SVC. It was then flushed with heparin  affix the skin with 3-0 nylon. The skin incision was closed with 4-0 Monocryl and Dermabond placed at both sites and antibiotic patch placed.  Drapes were taken down patient was then prepped and draped in the left upper extremity for access case. Ultrasound was used to identify both the basilic and cephalic veins which were both noted to be suitable for fistula creation. Transverse incision was made over the cephalic vein we dissected down and dissected this out and marked it for orientation. The patch was then incised and brachial artery exposed and vessel loop placed around it. Cephalic vein was then transected distally and tied off. It was then spatulated dilated to 3.5 mm and flushed with heparinized saline. Brachial artery was then clamped distally and proximally opened longitudinally and flushed with heparinized saline in both directions. The vein was then sewn end-to-side with 6-0 Prolene suture. Prior to completing the anastomosis flushing maneuvers were undertaken. At completion with palpable thrill in runoff vein we did however have a branch that had a pulse cephalad. We dissected out the pain for several centimeters identified the branch oversewn with 6-0 Prolene suture and this was uncomplicated. We then had a palpable thrill in runoff vein as well as palpable radial pulse and these were confirmed with Doppler. The wound was irrigated hemostasis obtained is closed in 2 layers with 3-0 Vicryl for Monocryl suture. Dermabond was placed level skin. Patient was allowed awaken from anesthesia having tolerated the procedure well without immediate complication. All counts were correct at completion.     Brandon C. Donzetta Matters, MD [facility] Office: [phone] Pager: [phone]

## 2016-11-24 NOTE — Discharge Instructions (Signed)
Call Dr. Randie Heinzain immediately if there is loss of function to the left hand.

## 2016-11-24 NOTE — Anesthesia Procedure Notes (Addendum)
Procedure Name: LMA Insertion Date/Time: 11/24/2016 1:07 PM Performed by: Reine JustFLOWERS, Rise Traeger T Pre-anesthesia Checklist: Patient identified, Emergency Drugs available, Suction available, Patient being monitored and Timeout performed Patient Re-evaluated:Patient Re-evaluated prior to induction Oxygen Delivery Method: Circle system utilized Preoxygenation: Pre-oxygenation with 100% oxygen Induction Type: IV induction Ventilation: Mask ventilation without difficulty LMA: LMA inserted LMA Size: 4.0 Number of attempts: 1 Airway Equipment and Method: Patient positioned with wedge pillow Placement Confirmation: ETT inserted through vocal cords under direct vision,  positive ETCO2 and breath sounds checked- equal and bilateral Tube secured with: Tape Dental Injury: Teeth and Oropharynx as per pre-operative assessment

## 2016-11-25 ENCOUNTER — Encounter (HOSPITAL_COMMUNITY): Payer: Self-pay | Admitting: Vascular Surgery

## 2016-11-25 ENCOUNTER — Telehealth: Payer: Self-pay | Admitting: Vascular Surgery

## 2016-11-25 NOTE — Telephone Encounter (Signed)
-----   Message from Sharee PimpleMarilyn K McChesney, RN sent at 11/24/2016  4:38 PM EDT ----- Regarding: 4-6 weeks w/ duplex   ----- Message ----- From: Maeola Harmanain, Brandon Christopher, MD Sent: 11/24/2016   3:35 PM To: [facility] [address]Pool  Juleen ChinaJuana A Delgado-Ochoa [id number] 06/23/1950  11/24/2016 Pre-operative Diagnosis: esrd  Surgeon:  Luanna SalkBrandon C. Randie Heinzain, MD  Procedure Performed: 1.  Right IJ tunneled dialysis catheter placement with US guidance 2.  Left upper arm brachio-cephalic avf creation  F/u in 4-6 weeks with dialysis duplex

## 2016-11-25 NOTE — Telephone Encounter (Signed)
Sched appt 01/16/17; lab at 3:00 and MD at 3:45. Ph# not accepting calls, mailed appt letter.

## 2016-11-25 NOTE — Anesthesia Postprocedure Evaluation (Signed)
Anesthesia Post Note  Patient: Frances Reeves  Procedure(s) Performed: Procedure(s) (LRB): FIRST STAGE BASCILIC VEIN TRANSPOSITION (Left) INSERTION OF TUNNELED DIALYSIS CATHETER (Right)     Patient location during evaluation: PACU Anesthesia Type: General Level of consciousness: awake and alert Pain management: pain level controlled Vital Signs Assessment: post-procedure vital signs reviewed and stable Respiratory status: spontaneous breathing, nonlabored ventilation, respiratory function stable and patient connected to nasal cannula oxygen Cardiovascular status: blood pressure returned to baseline and stable Postop Assessment: no signs of nausea or vomiting Anesthetic complications: no    Last Vitals:  Vitals:   11/24/16 1615 11/24/16 1633  BP: (!) 177/70 (!) 162/66  Pulse: 88 84  Resp: 11   Temp: (!) 36.2 C     Last Pain:  Vitals:   11/24/16 1029  TempSrc: Oral                 Anner Baity

## 2017-01-02 NOTE — Addendum Note (Signed)
Addended by: Burton Apley A on: 01/02/2017 02:33 PM   Modules accepted: Orders

## 2017-01-16 ENCOUNTER — Encounter: Payer: Medicare Other | Admitting: Vascular Surgery

## 2017-01-16 ENCOUNTER — Inpatient Hospital Stay (HOSPITAL_COMMUNITY): Admission: RE | Admit: 2017-01-16 | Payer: Medicare Other | Source: Ambulatory Visit

## 2017-01-30 ENCOUNTER — Ambulatory Visit
Admission: RE | Admit: 2017-01-30 | Discharge: 2017-01-30 | Disposition: A | Discharge status: Home / Self Care | Payer: Medicare Other | Source: Ambulatory Visit | Attending: Nephrology | Admitting: Nephrology

## 2017-01-30 ENCOUNTER — Other Ambulatory Visit: Payer: Self-pay | Admitting: Nephrology

## 2017-01-30 DIAGNOSIS — R52 Pain, unspecified: Secondary | ICD-10-CM

## 2017-02-02 ENCOUNTER — Emergency Department (HOSPITAL_COMMUNITY)
Admission: EM | Admit: 2017-02-02 | Discharge: 2017-02-03 | Disposition: A | Discharge status: Home / Self Care | Payer: Medicare Other | Attending: Emergency Medicine | Admitting: Emergency Medicine

## 2017-02-02 ENCOUNTER — Encounter (HOSPITAL_COMMUNITY): Payer: Self-pay | Admitting: Emergency Medicine

## 2017-02-02 DIAGNOSIS — Z5321 Procedure and treatment not carried out due to patient leaving prior to being seen by health care provider: Secondary | ICD-10-CM | POA: Diagnosis not present

## 2017-02-02 DIAGNOSIS — M25512 Pain in left shoulder: Secondary | ICD-10-CM | POA: Insufficient documentation

## 2017-02-02 LAB — CBC WITH DIFFERENTIAL/PLATELET
BASOS ABS: 0 10*3/uL (ref 0.0–0.1)
Basophils Relative: 0 %
EOS ABS: 0.1 10*3/uL (ref 0.0–0.7)
Eosinophils Relative: 2 %
HCT: 33.2 % — ABNORMAL LOW (ref 36.0–46.0)
HEMOGLOBIN: 9.9 g/dL — AB (ref 12.0–15.0)
LYMPHS ABS: 1.2 10*3/uL (ref 0.7–4.0)
LYMPHS PCT: 18 %
MCH: 25.5 pg — AB (ref 26.0–34.0)
MCHC: 29.8 g/dL — ABNORMAL LOW (ref 30.0–36.0)
MCV: 85.6 fL (ref 78.0–100.0)
Monocytes Absolute: 0.6 10*3/uL (ref 0.1–1.0)
Monocytes Relative: 8 %
NEUTROS PCT: 72 %
Neutro Abs: 5.2 10*3/uL (ref 1.7–7.7)
Platelets: 642 10*3/uL — ABNORMAL HIGH (ref 150–400)
RBC: 3.88 MIL/uL (ref 3.87–5.11)
RDW: 15.9 % — ABNORMAL HIGH (ref 11.5–15.5)
WBC: 7.1 10*3/uL (ref 4.0–10.5)

## 2017-02-02 LAB — COMPREHENSIVE METABOLIC PANEL
ALT: 25 U/L (ref 14–54)
ANION GAP: 11 (ref 5–15)
AST: 24 U/L (ref 15–41)
Albumin: 2.5 g/dL — ABNORMAL LOW (ref 3.5–5.0)
Alkaline Phosphatase: 592 U/L — ABNORMAL HIGH (ref 38–126)
BUN: 41 mg/dL — ABNORMAL HIGH (ref 6–20)
CHLORIDE: 100 mmol/L — AB (ref 101–111)
CO2: 27 mmol/L (ref 22–32)
Calcium: 8.7 mg/dL — ABNORMAL LOW (ref 8.9–10.3)
Creatinine, Ser: 3.78 mg/dL — ABNORMAL HIGH (ref 0.44–1.00)
GFR, EST AFRICAN AMERICAN: 13 mL/min — AB (ref >60)
GFR, EST NON AFRICAN AMERICAN: 11 mL/min — AB (ref >60)
Glucose, Bld: 237 mg/dL — ABNORMAL HIGH (ref 65–99)
Potassium: 4.6 mmol/L (ref 3.5–5.1)
SODIUM: 138 mmol/L (ref 135–145)
Total Bilirubin: 0.3 mg/dL (ref 0.3–1.2)
Total Protein: 7.4 g/dL (ref 6.5–8.1)

## 2017-02-02 LAB — PROTIME-INR
INR: 1.03
PROTHROMBIN TIME: 13.4 s (ref 11.4–15.2)

## 2017-02-02 LAB — I-STAT CG4 LACTIC ACID, ED: LACTIC ACID, VENOUS: 1.25 mmol/L (ref 0.5–1.9)

## 2017-02-02 NOTE — ED Triage Notes (Signed)
Pt having serve left shoulder pain for the last week had an xray on 10/12 which stated, "Given the history of basalic vein transposition, an ischemic related arthropathy should be considered. Alternatively, this has the appearance of a progressive inflammatory arthropathy. Septic arthritis cannot be ruled out". Pt states pain no better. Dialysis cathter in same arm.

## 2017-02-02 NOTE — ED Notes (Signed)
Unable to locate in lobby for treatment room. LWBS

## 2017-02-02 NOTE — ED Notes (Signed)
Patient did not answer when called for rooming x 3.  Will d/c

## 2017-02-07 ENCOUNTER — Inpatient Hospital Stay (HOSPITAL_COMMUNITY)
Admission: EM | Admit: 2017-02-07 | Discharge: 2017-02-10 | DRG: 189 | Disposition: A | Discharge status: Home / Self Care | Payer: Medicare Other | Attending: Internal Medicine | Admitting: Internal Medicine

## 2017-02-07 ENCOUNTER — Emergency Department (HOSPITAL_COMMUNITY): Payer: Medicare Other

## 2017-02-07 ENCOUNTER — Encounter (HOSPITAL_COMMUNITY): Payer: Self-pay | Admitting: Neurology

## 2017-02-07 DIAGNOSIS — E8889 Other specified metabolic disorders: Secondary | ICD-10-CM | POA: Diagnosis present

## 2017-02-07 DIAGNOSIS — R0789 Other chest pain: Secondary | ICD-10-CM

## 2017-02-07 DIAGNOSIS — R079 Chest pain, unspecified: Secondary | ICD-10-CM

## 2017-02-07 DIAGNOSIS — E11319 Type 2 diabetes mellitus with unspecified diabetic retinopathy without macular edema: Secondary | ICD-10-CM | POA: Diagnosis present

## 2017-02-07 DIAGNOSIS — E1122 Type 2 diabetes mellitus with diabetic chronic kidney disease: Secondary | ICD-10-CM | POA: Diagnosis present

## 2017-02-07 DIAGNOSIS — I132 Hypertensive heart and chronic kidney disease with heart failure and with stage 5 chronic kidney disease, or end stage renal disease: Secondary | ICD-10-CM | POA: Diagnosis present

## 2017-02-07 DIAGNOSIS — Z961 Presence of intraocular lens: Secondary | ICD-10-CM | POA: Diagnosis present

## 2017-02-07 DIAGNOSIS — Z9049 Acquired absence of other specified parts of digestive tract: Secondary | ICD-10-CM

## 2017-02-07 DIAGNOSIS — J9601 Acute respiratory failure with hypoxia: Principal | ICD-10-CM | POA: Diagnosis present

## 2017-02-07 DIAGNOSIS — Z8673 Personal history of transient ischemic attack (TIA), and cerebral infarction without residual deficits: Secondary | ICD-10-CM

## 2017-02-07 DIAGNOSIS — Z79899 Other long term (current) drug therapy: Secondary | ICD-10-CM

## 2017-02-07 DIAGNOSIS — Z7984 Long term (current) use of oral hypoglycemic drugs: Secondary | ICD-10-CM

## 2017-02-07 DIAGNOSIS — E1140 Type 2 diabetes mellitus with diabetic neuropathy, unspecified: Secondary | ICD-10-CM | POA: Diagnosis present

## 2017-02-07 DIAGNOSIS — D649 Anemia, unspecified: Secondary | ICD-10-CM

## 2017-02-07 DIAGNOSIS — N186 End stage renal disease: Secondary | ICD-10-CM | POA: Diagnosis not present

## 2017-02-07 DIAGNOSIS — R0602 Shortness of breath: Secondary | ICD-10-CM

## 2017-02-07 DIAGNOSIS — J810 Acute pulmonary edema: Secondary | ICD-10-CM

## 2017-02-07 DIAGNOSIS — D638 Anemia in other chronic diseases classified elsewhere: Secondary | ICD-10-CM | POA: Diagnosis present

## 2017-02-07 DIAGNOSIS — Z9842 Cataract extraction status, left eye: Secondary | ICD-10-CM

## 2017-02-07 DIAGNOSIS — E785 Hyperlipidemia, unspecified: Secondary | ICD-10-CM | POA: Diagnosis present

## 2017-02-07 DIAGNOSIS — E1165 Type 2 diabetes mellitus with hyperglycemia: Secondary | ICD-10-CM

## 2017-02-07 DIAGNOSIS — IMO0002 Reserved for concepts with insufficient information to code with codable children: Secondary | ICD-10-CM

## 2017-02-07 DIAGNOSIS — K219 Gastro-esophageal reflux disease without esophagitis: Secondary | ICD-10-CM | POA: Diagnosis present

## 2017-02-07 DIAGNOSIS — E8770 Fluid overload, unspecified: Secondary | ICD-10-CM | POA: Diagnosis present

## 2017-02-07 DIAGNOSIS — Z992 Dependence on renal dialysis: Secondary | ICD-10-CM

## 2017-02-07 DIAGNOSIS — Z9841 Cataract extraction status, right eye: Secondary | ICD-10-CM

## 2017-02-07 LAB — HEMOGLOBIN A1C
HEMOGLOBIN A1C: 7.2 % — AB (ref 4.8–5.6)
Mean Plasma Glucose: 159.94 mg/dL

## 2017-02-07 LAB — CBC WITH DIFFERENTIAL/PLATELET
BASOS ABS: 0 10*3/uL (ref 0.0–0.1)
BASOS PCT: 0 %
EOS ABS: 0.1 10*3/uL (ref 0.0–0.7)
Eosinophils Relative: 1 %
HCT: 31.4 % — ABNORMAL LOW (ref 36.0–46.0)
HEMOGLOBIN: 9.4 g/dL — AB (ref 12.0–15.0)
Lymphocytes Relative: 15 %
Lymphs Abs: 1.1 10*3/uL (ref 0.7–4.0)
MCH: 24.4 pg — ABNORMAL LOW (ref 26.0–34.0)
MCHC: 29.9 g/dL — ABNORMAL LOW (ref 30.0–36.0)
MCV: 81.6 fL (ref 78.0–100.0)
Monocytes Absolute: 0.5 10*3/uL (ref 0.1–1.0)
Monocytes Relative: 7 %
NEUTROS PCT: 77 %
Neutro Abs: 5.5 10*3/uL (ref 1.7–7.7)
Platelets: 480 10*3/uL — ABNORMAL HIGH (ref 150–400)
RBC: 3.85 MIL/uL — AB (ref 3.87–5.11)
RDW: 16.1 % — ABNORMAL HIGH (ref 11.5–15.5)
WBC: 7.2 10*3/uL (ref 4.0–10.5)

## 2017-02-07 LAB — BASIC METABOLIC PANEL
Anion gap: 13 (ref 5–15)
BUN: 48 mg/dL — ABNORMAL HIGH (ref 6–20)
CHLORIDE: 95 mmol/L — AB (ref 101–111)
CO2: 24 mmol/L (ref 22–32)
CREATININE: 4.06 mg/dL — AB (ref 0.44–1.00)
Calcium: 7.8 mg/dL — ABNORMAL LOW (ref 8.9–10.3)
GFR, EST AFRICAN AMERICAN: 12 mL/min — AB (ref >60)
GFR, EST NON AFRICAN AMERICAN: 10 mL/min — AB (ref >60)
Glucose, Bld: 187 mg/dL — ABNORMAL HIGH (ref 65–99)
POTASSIUM: 4.9 mmol/L (ref 3.5–5.1)
SODIUM: 132 mmol/L — AB (ref 135–145)

## 2017-02-07 LAB — GLUCOSE, CAPILLARY
GLUCOSE-CAPILLARY: 107 mg/dL — AB (ref 65–99)
Glucose-Capillary: 148 mg/dL — ABNORMAL HIGH (ref 65–99)

## 2017-02-07 LAB — MRSA PCR SCREENING: MRSA by PCR: NEGATIVE

## 2017-02-07 LAB — D-DIMER, QUANTITATIVE (NOT AT ARMC): D DIMER QUANT: 1.76 ug{FEU}/mL — AB (ref 0.00–0.50)

## 2017-02-07 LAB — I-STAT TROPONIN, ED: TROPONIN I, POC: 0 ng/mL (ref 0.00–0.08)

## 2017-02-07 LAB — TROPONIN I

## 2017-02-07 MED ORDER — MORPHINE SULFATE (PF) 4 MG/ML IV SOLN
1.0000 mg | INTRAVENOUS | Status: DC | PRN
  Administered 2017-02-07 – 2017-02-09 (×3): 1 mg via INTRAVENOUS
  Filled 2017-02-07 (×3): qty 1

## 2017-02-07 MED ORDER — NITROGLYCERIN 0.4 MG SL SUBL
0.4000 mg | SUBLINGUAL_TABLET | SUBLINGUAL | Status: DC | PRN
  Administered 2017-02-07: 0.4 mg via SUBLINGUAL
  Filled 2017-02-07: qty 1

## 2017-02-07 MED ORDER — MORPHINE SULFATE (PF) 4 MG/ML IV SOLN
2.0000 mg | Freq: Once | INTRAVENOUS | Status: AC
  Administered 2017-02-07: 2 mg via INTRAVENOUS
  Filled 2017-02-07: qty 1

## 2017-02-07 MED ORDER — GI COCKTAIL ~~LOC~~: 30.0000 mL | Freq: Four times a day (QID) | ORAL | Status: DC | PRN

## 2017-02-07 MED ORDER — DARBEPOETIN ALFA 150 MCG/0.3ML IJ SOSY
150.0000 ug | PREFILLED_SYRINGE | INTRAMUSCULAR | Status: DC
  Administered 2017-02-10: 150 ug via INTRAVENOUS
  Filled 2017-02-07: qty 0.3

## 2017-02-07 MED ORDER — HEPARIN SODIUM (PORCINE) 5000 UNIT/ML IJ SOLN
5000.0000 [IU] | Freq: Three times a day (TID) | INTRAMUSCULAR | Status: DC
  Administered 2017-02-07 – 2017-02-10 (×8): 5000 [IU] via SUBCUTANEOUS
  Filled 2017-02-07 (×7): qty 1

## 2017-02-07 MED ORDER — DOXERCALCIFEROL 4 MCG/2ML IV SOLN
2.0000 ug | INTRAVENOUS | Status: DC
  Administered 2017-02-10: 2 ug via INTRAVENOUS
  Filled 2017-02-07: qty 2

## 2017-02-07 MED ORDER — SODIUM CHLORIDE 0.9 % IV SOLN
62.5000 mg | INTRAVENOUS | Status: DC
  Administered 2017-02-10: 62.5 mg via INTRAVENOUS
  Filled 2017-02-07 (×2): qty 5

## 2017-02-07 MED ORDER — SIMVASTATIN 20 MG PO TABS
20.0000 mg | ORAL_TABLET | Freq: Every day | ORAL | Status: DC
  Administered 2017-02-07 – 2017-02-09 (×3): 20 mg via ORAL
  Filled 2017-02-07 (×3): qty 1

## 2017-02-07 MED ORDER — ONDANSETRON HCL 4 MG/2ML IJ SOLN: 4.0000 mg | Freq: Four times a day (QID) | INTRAMUSCULAR | Status: DC | PRN

## 2017-02-07 MED ORDER — ACETAMINOPHEN 325 MG PO TABS: 650.0000 mg | ORAL_TABLET | Freq: Four times a day (QID) | ORAL | Status: DC | PRN

## 2017-02-07 MED ORDER — INSULIN ASPART 100 UNIT/ML ~~LOC~~ SOLN
0.0000 [IU] | Freq: Three times a day (TID) | SUBCUTANEOUS | Status: DC
  Administered 2017-02-08: 1 [IU] via SUBCUTANEOUS
  Administered 2017-02-08: 2 [IU] via SUBCUTANEOUS
  Administered 2017-02-09: 7 [IU] via SUBCUTANEOUS

## 2017-02-07 MED ORDER — ASPIRIN 81 MG PO CHEW: 324.0000 mg | CHEWABLE_TABLET | Freq: Once | ORAL | Status: DC

## 2017-02-07 MED ORDER — ALPRAZOLAM 0.25 MG PO TABS
0.2500 mg | ORAL_TABLET | Freq: Two times a day (BID) | ORAL | Status: DC | PRN
  Administered 2017-02-07: 0.25 mg via ORAL
  Filled 2017-02-07: qty 1

## 2017-02-07 NOTE — H&P (Signed)
History and Physical    Frances Reeves BJY:[id number] DOB: 06/04/1950 DOA: 02/07/2017   PCP: Patient, No Pcp Per   Patient coming from:  Home    Chief Complaint: Shortness of breath and Chest pain   HPI: Frances Reeves is a 66 y.o. female with medical history significant for  ESRD on hemodialysis since about 3 weeks ago, on Tuesday Thursday and Saturday, lateral dialyzed October 18, did not present today for dialysis, history of diabetes, hyperlipidemia, GERD, history of CVA, presenting to the ED with acute onset of shortness of breath since around 5 AM, a combining by a left arm pain for the last 8 days. She had some nausea, and intermittent dizziness. No  diaphoresis. She also reports a dry cough since this morning, but no fever. Denies sick contacts or long distance trips.  The patient denies any abdominal pain. No leg swelling or calf pain. She denies any history of MI, PE or DVT. She denies any prior history of CAD or congestive heart failure, never had a stress tests or cardiac catheterization. She denies taking daily ASA or other blood thinners. No hormonal replacement or over the counter supplements. No tobacco, ETOH or recreational drug use    ED Course:  BP 137/62   Pulse 85   Temp 98.6 F (37 C) (Oral)   Resp 16   SpO2 92%    on presentation,   received aspirin and nitroglycerin, with some relief. D-dimer is positive at 1.76, troponin negative,  VQ scan pending EKG does not show ACS. Is on sinus rhythm. Heart score is 4 White count normal at 7.2 Hemoglobin 9.4 platelets 480  Creatinine 4.06.  Review of Systems:  As per HPI otherwise all other systems reviewed and are negative  Past Medical History:  Diagnosis Date  . Anemia   . Chronic kidney disease   . Diabetes mellitus without complication (HCC)   . GERD (gastroesophageal reflux disease)   . Stroke [facility](HCC)    in her 3240's    Past Surgical History:  Procedure Laterality Date  . APPENDECTOMY    .  BASCILIC VEIN TRANSPOSITION Left 11/24/2016   Procedure: FIRST STAGE BASCILIC VEIN TRANSPOSITION;  Surgeon: Maeola Harmanain, Brandon Christopher, MD;  [facility];  Service: Vascular;  Laterality: Left;  . EYE SURGERY Bilateral    cataract surgery with lens implant  . INSERTION OF DIALYSIS CATHETER Right 11/24/2016   Procedure: INSERTION OF TUNNELED DIALYSIS CATHETER;  Surgeon: Maeola Harmanain, Brandon Christopher, MD;  [facility];  Service: Vascular;  Laterality: Right;    Social History Social History   Social History  . Marital status: Widowed    Spouse name: N/A  . Number of children: N/A  . Years of education: N/A   Occupational History  . Not on file.   Social History Main Topics  . Smoking status: Never Smoker  . Smokeless tobacco: Never Used  . Alcohol use No  . Drug use: No  . Sexual activity: Not on file   Other Topics Concern  . Not on file   Social History Narrative  . No narrative on file     No Known Allergies  History reviewed. No pertinent family history.    Prior to Admission medications   Medication Sig Start Date End Date Taking? Authorizing Provider  acetaminophen (TYLENOL) 650 MG CR tablet Take 650 mg by mouth every 8 (eight) hours as needed for pain.   Yes [provider]  glimepiride (AMARYL) 4 MG tablet Take  4 mg by mouth daily. 11/03/16  Yes [provider]  simvastatin (ZOCOR) 20 MG tablet Take 20 mg by mouth daily. 11/03/16  Yes [provider]  metFORMIN (GLUCOPHAGE) 500 MG tablet Take 1 tablet (500 mg total) by mouth 2 (two) times daily. Patient not taking: Reported on 12/25/2016 07/08/16   Gerhard Munch, MD  oxyCODONE-acetaminophen (ROXICET) 5-325 MG tablet Take 1 tablet by mouth every 6 (six) hours as needed. Patient not taking: Reported on 02/07/2017 11/24/16 11/24/17  Maeola Harman, MD  traMADol (ULTRAM) 50 MG tablet Take 1 tablet (50 mg total) by mouth every 6 (six) hours as needed. Patient not taking: Reported on  11/24/2016 02/20/15   Gerhard Munch, MD    Physical Exam:  Vitals:   02/07/17 1245 02/07/17 1300 02/07/17 1345 02/07/17 1400  BP: (!) 145/76 (!) 155/73 (!) 144/69 137/62  Pulse: 88 89 85 85  Resp: 16 (!) 22 15 16   Temp:      TempSrc:      SpO2: 90% 96% 93% 92%   Constitutional: NAD, but ill appearing, very thin, frail.  Eyes: PERRL, lids and conjunctivae normal ENMT: Mucous membranes are moist, without exudate or lesions  Neck: normal, supple, no masses, no thyromegaly Respiratory: clear to auscultation bilaterally, no wheezing, no crackles. modereate respiratory effort, begins to cough upon taking a deep breath  Cardiovascular: Regular rate and rhythm, occational ectopic beat  murmur, rubs or gallops. No extremity edema. 2+ pedal pulses. No carotid bruits.  Abdomen: Soft, non tender, No hepatosplenomegaly. Bowel sounds positive.  Musculoskeletal: no clubbing / cyanosis. Moves all extremities Skin: no jaundice, No lesions.  Neurologic: Sensation intact  Strength equal in all extremities Psychiatric:   Alert and oriented x 3. Normal mood.     Labs on Admission: I have personally reviewed following labs and imaging studies  CBC:  Recent Labs Lab 02/02/17 1611 02/07/17 1230  WBC 7.1 7.2  NEUTROABS 5.2 5.5  HGB 9.9* 9.4*  HCT 33.2* 31.4*  MCV 85.6 81.6  PLT 642* 480*    Basic Metabolic Panel:  Recent Labs Lab 02/02/17 1611 02/07/17 1230  NA 138 132*  K 4.6 4.9  CL 100* 95*  CO2 27 24  GLUCOSE 237* 187*  BUN 41* 48*  CREATININE 3.78* 4.06*  CALCIUM 8.7* 7.8*    GFR: CrCl cannot be calculated (Unknown ideal weight.).  Liver Function Tests:  Recent Labs Lab 02/02/17 1611  AST 24  ALT 25  ALKPHOS 592*  BILITOT 0.3  PROT 7.4  ALBUMIN 2.5*   No results for input(s): LIPASE, AMYLASE in the last 168 hours. No results for input(s): AMMONIA in the last 168 hours.  Coagulation Profile:  Recent Labs Lab 02/02/17 1611  INR 1.03    Cardiac  Enzymes:  Recent Labs Lab 02/07/17 1230  TROPONINI <0.03    BNP (last 3 results) No results for input(s): PROBNP in the last 8760 hours.  HbA1C: No results for input(s): HGBA1C in the last 72 hours.  CBG: No results for input(s): GLUCAP in the last 168 hours.  Lipid Profile: No results for input(s): CHOL, HDL, LDLCALC, TRIG, CHOLHDL, LDLDIRECT in the last 72 hours.  Thyroid Function Tests: No results for input(s): TSH, T4TOTAL, FREET4, T3FREE, THYROIDAB in the last 72 hours.  Anemia Panel: No results for input(s): VITAMINB12, FOLATE, FERRITIN, TIBC, IRON, RETICCTPCT in the last 72 hours.  Urine analysis:    Component Value Date/Time   COLORURINE STRAW (A) 07/08/2016 1148   APPEARANCEUR CLEAR  07/08/2016 1148   LABSPEC 1.020 07/08/2016 1148   PHURINE 6.0 07/08/2016 1148   GLUCOSEU >=500 (A) 07/08/2016 1148   HGBUR NEGATIVE 07/08/2016 1148   BILIRUBINUR NEGATIVE 07/08/2016 1148   KETONESUR NEGATIVE 07/08/2016 1148   PROTEINUR >=300 (A) 07/08/2016 1148   UROBILINOGEN 0.2 02/20/2015 2223   NITRITE NEGATIVE 07/08/2016 1148   LEUKOCYTESUR NEGATIVE 07/08/2016 1148    Sepsis Labs: @LABRCNTIP (procalcitonin:4,lacticidven:4) )No results found for this or any previous visit (from the past 240 hour(s)).   Radiological Exams on Admission: Dg Chest 2 View  Result Date: 02/07/2017 CLINICAL DATA:  66 year old female with shortness of breath and chest pain. EXAM: CHEST  2 VIEW COMPARISON:  Prior chest x-ray 11/24/2016 FINDINGS: Stable cardiomegaly. Worsened pulmonary vascular congestion now with mild interstitial edema. New small bilateral layering pleural effusions. A right IJ tunneled hemodialysis catheter appears to be in good position with the tip in the distal SVC. No evidence of pneumothorax. Atherosclerotic calcifications are present in the transverse aorta. No acute osseous abnormality. IMPRESSION: 1. Cardiomegaly with pulmonary interstitial edema and small bilateral layering  pleural effusions. Differential considerations include congestive heart failure and volume overload. 2. The tip of the right IJ approach tunneled hemodialysis catheter overlies the cavoatrial junction. 3.  Aortic Atherosclerosis (ICD10-170.0) Electronically Signed   By: Malachy Moan M.D.   On: 02/07/2017 13:37    EKG: Independently reviewed.  Assessment/Plan Active Problems:   DM (diabetes mellitus) (HCC)   ESRD (end stage renal disease) (HCC)   GERD (gastroesophageal reflux disease)   Anemia   History of CVA (cerebrovascular accident)   Chest pain   Shortness of breath   Chest pain syndrome HEART score 4 . Troponin neg.  EKG without evidence of ACS. CP  relieved by nitroglycerin, morphine, aspirin.  Admit to Telemetry/ Observation Chest pain order set Cycle troponins EKG in am  continue ASA, O2 and NTG as needed GI cocktail Check Lipid panel  Hb A1C   Acute respiratory distress in the setting of missing dialysis with some fluid overload, rule out other etiology including PE in view of recent R chest dialysis access placement  DDmer is positive. VQ scan pending . WBC normal CXR with cardiopulmonary interstitial edema and small B pleural effusion  [place] 91 in RA. No history of PE or DVT. We will await for the VQ scan results Dialysis today   ESRD on HD TThS, last on 10/18  baseline creatinine 2, currently at 4.06 Primary Nephrologist Dr. Kathrene Bongo for possible dialysis today  Nephrology involved, Dr. Kathrene Bongo notified. Renal Diet. Other plans as per Nephrology Check CMET in am  Type II Diabetes Current blood sugar level is 187 No results found for: HGBA1C Hgb A1C Hold home oral diabetic medications.   SSI   Hypertension BP 137/62   Pulse 85  She is not on anti-hypertensive medications  Hydralazine 5-10 q 8 prn for BP 160/90 after dialysis  May need to be discharged with BP med and follow with Dr. August Saucer as outrpatient  Hyperlipidemia Continue home  statins  GERD, no acute symptoms Continue PPI  Anemia of chronic disease Hemoglobin on admission 9.4 at baseline. No bleeding issues  Hcult   Repeat CBC in am  No transfusion is indicated at this time Continue Iron supplements   DVT prophylaxis:  Heparin  Code Status:    Full  Family Communication:  Discussed with patient Disposition Plan: Expect patient to be discharged to home after condition improves Consults called:    Nephrology, Dr. Kathrene Bongo  Admission status: Tele Obs    Marcos Eke, PA-C [facility]   02/07/2017, 2:23 PM

## 2017-02-07 NOTE — ED Notes (Signed)
Pt did not want another nitro tablet for CP. 8/10

## 2017-02-07 NOTE — Consult Note (Signed)
[facility] Renal Consultation Note  Indication for Consultation:  Management of ESRD/hemodialysis; anemia, hypertension/volume and secondary hyperparathyroidism  HPI: Frances Reeves is a 66 y.o. female with ESRD presume diabetic nephropathy. 1st HD 8/918. Followed by Dr. Justin Mend in office , DM, HTN, diabetic retinopathy, neuropathy,  [place], [place] speaking  Admitted with Pulmonary edema / volume overload. Lives alone reports drinking excess fluids fairly new to hd and not aware of restrictions .Daughter in room translates. Will plan for HD  For uf / Today is her normal day on TTS schedule. Noted also missed Tues 02/03/17  HD Tx. Currently  Not in distress.     Past Medical History:  Diagnosis Date  . Anemia   . Chronic kidney disease   . Diabetes mellitus without complication ([place])   . GERD (gastroesophageal reflux disease)   . Stroke [facility])    in her 66's    Past Surgical History:  Procedure Laterality Date  . APPENDECTOMY    . BASCILIC VEIN TRANSPOSITION Left 11/24/2016   Procedure: FIRST STAGE BASCILIC VEIN TRANSPOSITION;  Surgeon: Waynetta Sandy, MD;  Location: [place];  Service: Vascular;  Laterality: Left;  . EYE SURGERY Bilateral    cataract surgery with lens implant  . INSERTION OF DIALYSIS CATHETER Right 11/24/2016   Procedure: INSERTION OF TUNNELED DIALYSIS CATHETER;  Surgeon: Waynetta Sandy, MD;  Location: [place];  Service: Vascular;  Laterality: Right;     History reviewed. No pertinent family history.    reports that she has never smoked. She has never used smokeless tobacco. She reports that she does not drink alcohol or use drugs.  No Known Allergies  Prior to Admission medications   Medication Sig Start Date End Date Taking? Authorizing Provider  acetaminophen (TYLENOL) 650 MG CR tablet Take 650 mg by mouth every 8 (eight) hours as needed for pain.   Yes [provider]  glimepiride (AMARYL) 4 MG tablet Take 4 mg  by mouth daily. 11/03/16  Yes [provider]  simvastatin (ZOCOR) 20 MG tablet Take 20 mg by mouth daily. 11/03/16  Yes [provider]  metFORMIN (GLUCOPHAGE) 500 MG tablet Take 1 tablet (500 mg total) by mouth 2 (two) times daily. Patient not taking: Reported on 12/25/2016 07/08/16   Carmin Muskrat, MD  oxyCODONE-acetaminophen (ROXICET) 5-325 MG tablet Take 1 tablet by mouth every 6 (six) hours as needed. Patient not taking: Reported on 02/07/2017 11/24/16 11/24/17  Waynetta Sandy, MD  traMADol (ULTRAM) 50 MG tablet Take 1 tablet (50 mg total) by mouth every 6 (six) hours as needed. Patient not taking: Reported on 11/24/2016 02/20/15   Carmin Muskrat, MD     Anti-infectives    None      Results for orders placed or performed during the hospital encounter of 02/07/17 (from the past 48 hour(s))  CBC with Differential     Status: Abnormal   Collection Time: 02/07/17 12:30 PM  Result Value Ref Range   WBC 7.2 4.0 - 10.5 K/uL   RBC 3.85 (L) 3.87 - 5.11 MIL/uL   Hemoglobin 9.4 (L) 12.0 - 15.0 g/dL   HCT 31.4 (L) 36.0 - 46.0 %   MCV 81.6 78.0 - 100.0 fL   MCH 24.4 (L) 26.0 - 34.0 pg   MCHC 29.9 (L) 30.0 - 36.0 g/dL   RDW 16.1 (H) 11.5 - 15.5 %   Platelets 480 (H) 150 - 400 K/uL   Neutrophils Relative % 77 %   Neutro Abs  5.5 1.7 - 7.7 K/uL   Lymphocytes Relative 15 %   Lymphs Abs 1.1 0.7 - 4.0 K/uL   Monocytes Relative 7 %   Monocytes Absolute 0.5 0.1 - 1.0 K/uL   Eosinophils Relative 1 %   Eosinophils Absolute 0.1 0.0 - 0.7 K/uL   Basophils Relative 0 %   Basophils Absolute 0.0 0.0 - 0.1 K/uL  Basic metabolic panel     Status: Abnormal   Collection Time: 02/07/17 12:30 PM  Result Value Ref Range   Sodium 132 (L) 135 - 145 mmol/L   Potassium 4.9 3.5 - 5.1 mmol/L   Chloride 95 (L) 101 - 111 mmol/L   CO2 24 22 - 32 mmol/L   Glucose, Bld 187 (H) 65 - 99 mg/dL   BUN 48 (H) 6 - 20 mg/dL   Creatinine, Ser 4.06 (H) 0.44 - 1.00 mg/dL   Calcium 7.8 (L) 8.9 -  10.3 mg/dL   GFR calc non Af Amer 10 (L) >60 mL/min   GFR calc Af Amer 12 (L) >60 mL/min    Comment: (NOTE) The eGFR has been calculated using the CKD EPI equation. This calculation has not been validated in all clinical situations. eGFR's persistently <60 mL/min signify possible Chronic Kidney Disease.    Anion gap 13 5 - 15  Troponin I     Status: None   Collection Time: 02/07/17 12:30 PM  Result Value Ref Range   Troponin I <0.03 <0.03 ng/mL  D-dimer, quantitative (not at [facility])     Status: Abnormal   Collection Time: 02/07/17 12:30 PM  Result Value Ref Range   D-Dimer, Quant 1.76 (H) 0.00 - 0.50 ug/mL-FEU    Comment: (NOTE) At the manufacturer cut-off of 0.50 ug/mL FEU, this assay has been documented to exclude PE with a sensitivity and negative predictive value of 97 to 99%.  At this time, this assay has not been approved by the FDA to exclude DVT/VTE. Results should be correlated with clinical presentation.   I-stat troponin, ED     Status: None   Collection Time: 02/07/17 12:47 PM  Result Value Ref Range   Troponin i, poc 0.00 0.00 - 0.08 ng/mL   Comment 3            Comment: Due to the release kinetics of cTnI, a negative result within the first hours of the onset of symptoms does not rule out myocardial infarction with certainty. If myocardial infarction is still suspected, repeat the test at appropriate intervals.     ROS: As in HPI positives .   Physical Exam: Vitals:   02/07/17 1445 02/07/17 1500  BP: (!) 145/60 (!) 145/64  Pulse: 88 87  Resp: 20 15  Temp:    SpO2: 95% 90%     General: alert, Hispanic elderly Female  OX3, appropriate  HEENT: [place] , Not icteric , MM dry  Neck: pos jvd Heart: RRR ,no m, r, g Lungs: Bilat crackles ,nonlabored breathing  Abdomen: soft, nt,nd Extremities: no pedal edema Skin: warm dry , no overt rash Neuro: alert,moves all extrem  Dialysis Access: R IJ Perm cath Pos bruit LUA AVF   Dialysis: AF TTS 4h   43.5kg    2/2.25   Heparin none?   LUA AVF -Mircera 225 mcg (last on 01/29/17 )  -Venofer 50 mg q wk  Hd -Hec 64mg  Assessment/Plan 1. Pulm edema / vol overload- uf on hd  ,fu wts to lower edw  2. ESRD -  HD on  TTS  3. Hypertension/volume  - As above ^ Vol  uf with hd ,stable bp, no bp meds  4. Anemia  - hgb 9.4 ESA  Next on Tues 10/23 5. Metabolic bone disease -  Hec, no binder 6. Nutrition - Renal, carb mod , renal vit 7. DMtype 2 - per admit team   Ernest Haber, PA-C [facility] [phone] 02/07/2017, 3:06 PM   Pt seen, examined and agree w A/P as above.  Kelly Splinter MD [employer] pager [phone]   02/07/2017, 5:32 PM

## 2017-02-07 NOTE — ED Notes (Signed)
Got patient undress on the monitor did ekg shown to Dr Ward patient is resting with call bell in reach 

## 2017-02-07 NOTE — ED Provider Notes (Signed)
TIME SEEN: 12:11 PM  CHIEF COMPLAINT: chest pain  HPI: Pt is a 66 y.o. female with history of end-stage renal disease on hemodialysis Tuesday, Thursday and Saturday he was last dialyzed on October 18, diabetes, hyperlipidemia, CVA who presents to the emergency department with chest pain. Describes it as central and feels like a pressure. Pain started at 5 AM. She has also had pain in the left arm for 8 days. She has had associated shortness of breath this chest pain, nausea, dizziness. No diaphoresis. Has had a dry cough but no fever. No leg swelling or pain. No history of MI, PE or DVT. States she did not feel well enough to go to dialysis today. No family history of CHF, CAD, PE. She has never had a stress test or cardiac catheterization. Her primary care physician is Dr. August Saucer.  She does not have a cardiologist.  States she has only been on dialysis for 2-3 weeks.  EMS gave full dose aspirin and 6 mg of morphine prior to arrival.   Spanish interpretor used throughout visit.  ROS: See HPI Constitutional: no fever  Eyes: no drainage  ENT: no runny nose   Cardiovascular:   chest pain  Resp:  SOB  GI: no vomiting GU: no dysuria Integumentary: no rash  Allergy: no hives  Musculoskeletal: no leg swelling  Neurological: no slurred speech ROS otherwise negative  PAST MEDICAL HISTORY/PAST SURGICAL HISTORY:  Past Medical History:  Diagnosis Date  . Anemia   . Chronic kidney disease   . Diabetes mellitus without complication (HCC)   . GERD (gastroesophageal reflux disease)   . Stroke [facility])    in her 42's    MEDICATIONS:  Prior to Admission medications   Medication Sig Start Date End Date Taking? Authorizing Provider  metFORMIN (GLUCOPHAGE) 1000 MG tablet Take 1,000 mg by mouth 2 (two) times daily. 11/03/16   [provider]  metFORMIN (GLUCOPHAGE) 500 MG tablet Take 1 tablet (500 mg total) by mouth 2 (two) times daily. Patient not taking: Reported on 12/25/2016 07/08/16   Gerhard Munch, MD  oxyCODONE-acetaminophen (ROXICET) 5-325 MG tablet Take 1 tablet by mouth every 6 (six) hours as needed. 11/24/16 11/24/17  Maeola Harman, MD  traMADol (ULTRAM) 50 MG tablet Take 1 tablet (50 mg total) by mouth every 6 (six) hours as needed. Patient not taking: Reported on 11/24/2016 02/20/15   Gerhard Munch, MD    ALLERGIES:  No Known Allergies  SOCIAL HISTORY:  Social History  Substance Use Topics  . Smoking status: Never Smoker  . Smokeless tobacco: Never Used  . Alcohol use No    FAMILY HISTORY: No family history on file.  EXAM: BP (!) 156/70 (BP Location: Right Arm)   Pulse 91   Temp 98.6 F (37 C) (Oral)   Resp 20   SpO2 97%  CONSTITUTIONAL: Alert and oriented and responds appropriately to questions. Chronically ill-appearing, appears uncomfortable, afebrile HEAD: Normocephalic EYES: Conjunctivae clear, pupils appear equal, EOMI ENT: normal nose; moist mucous membranes NECK: Supple, no meningismus, no nuchal rigidity, no LAD  CARD: RRR; S1 and S2 appreciated; no murmurs, no clicks, no rubs, no gallops CHEST:  nontender to palpation, dialysis catheter in the right chest wall without surrounding erythema, warmth, ecchymosis, crepitus, no drainage or bleeding RESP: Normal chest excursion without splinting, patient is intermittently tachypneic, breath sounds clear and equal bilaterally; no wheezes, no rhonchi, no rales, no hypoxia or respiratory distress, speaking full sentences ABD/GI: Normal bowel sounds; non-distended; soft, non-tender,  no rebound, no guarding, no peritoneal signs, no hepatosplenomegaly BACK:  The back appears normal and is non-tender to palpation, there is no CVA tenderness EXT: Normal ROM in all joints; non-tender to palpation; no edema; normal capillary refill; no cyanosis, no calf tenderness or swelling    SKIN: Normal color for age and race; warm; no rash NEURO: Moves all extremities equally PSYCH: The patient's mood and manner are  appropriate. Grooming and personal hygiene are appropriate.  MEDICAL DECISION MAKING: Pt here with chest pain, shortness of breath, nausea, left arm pain. Concern for possible ACS, PE. Does not appear volume overloaded on exam.  We'll give aspirin, nitroglycerin. Will obtain cardiac labs including d-dimer. EKG shows no ischemic abnormality. Anticipate admission. Her hear score is 4.   ED PROGRESS: Patient's troponin is negative. D-dimer positive. We'll obtain VQ scan given she recently started dialysis. She has anemia of chronic kidney disease which is stable. Had some improvement in chest pain after nitroglycerin times one the refused any further. We'll give morphine.  2:00 AM  Pt's labs show a negative troponin.  Chest x-ray shows cardiomegaly with pulmonary interstitial edema and small bilateral layering effusions. Likely the cause of her symptoms. We'll discuss with nephrology for urgent dialysis. We'll discuss with medicine for admission.  Her nephrologist is Dr. [place]acy [place].  2:09 PM Discussed patient's case with Marlowe KaysSara Wertman PA with hospitalist service.  I have recommended admission and patient (and family if present) agree with this plan. Admitting team will place admission orders.   I reviewed all nursing notes, vitals, pertinent previous records, EKGs, lab and urine results, imaging (as available).   2:25 AM  D/w Dr. Arlean HoppingSchertz with nephrology. He will see patient in consult for dialysis today.    EKG Interpretation  Date/Time:  Saturday February 07 2017 11:57:05 EDT Ventricular Rate:  90 PR Interval:    QRS Duration: 96 QT Interval:  380 QTC Calculation: 465 R Axis:   66 Text Interpretation:  Sinus rhythm Confirmed by Rochele RaringWard, Christyana Corwin [phone]([id number]) on 02/07/2017 12:11:28 PM         Avish Torry, Layla MawKristen N, DO 02/07/17 1425

## 2017-02-07 NOTE — ED Triage Notes (Signed)
Per ems- is coming from home, missed her dialysis today due to CP. Last session was Thursday. Also c/o sob, is spanish speaking, points to center of chest. Gave 6 mg morphine. She seems more comfortable. BP 162/80, HR 94 SR, CBG 192, 96% RA. Gave 324 aspirin, 1 SL nitro.

## 2017-02-07 NOTE — ED Notes (Signed)
Patient transported to X-ray 

## 2017-02-08 ENCOUNTER — Observation Stay (HOSPITAL_COMMUNITY): Payer: Medicare Other

## 2017-02-08 DIAGNOSIS — E8889 Other specified metabolic disorders: Secondary | ICD-10-CM | POA: Diagnosis present

## 2017-02-08 DIAGNOSIS — J9601 Acute respiratory failure with hypoxia: Secondary | ICD-10-CM | POA: Diagnosis present

## 2017-02-08 DIAGNOSIS — Z9841 Cataract extraction status, right eye: Secondary | ICD-10-CM | POA: Diagnosis not present

## 2017-02-08 DIAGNOSIS — N186 End stage renal disease: Secondary | ICD-10-CM | POA: Diagnosis not present

## 2017-02-08 DIAGNOSIS — Z7984 Long term (current) use of oral hypoglycemic drugs: Secondary | ICD-10-CM | POA: Diagnosis not present

## 2017-02-08 DIAGNOSIS — Z8673 Personal history of transient ischemic attack (TIA), and cerebral infarction without residual deficits: Secondary | ICD-10-CM | POA: Diagnosis not present

## 2017-02-08 DIAGNOSIS — R0789 Other chest pain: Secondary | ICD-10-CM | POA: Diagnosis present

## 2017-02-08 DIAGNOSIS — E1122 Type 2 diabetes mellitus with diabetic chronic kidney disease: Secondary | ICD-10-CM | POA: Diagnosis present

## 2017-02-08 DIAGNOSIS — Z9049 Acquired absence of other specified parts of digestive tract: Secondary | ICD-10-CM | POA: Diagnosis not present

## 2017-02-08 DIAGNOSIS — E11319 Type 2 diabetes mellitus with unspecified diabetic retinopathy without macular edema: Secondary | ICD-10-CM | POA: Diagnosis present

## 2017-02-08 DIAGNOSIS — D638 Anemia in other chronic diseases classified elsewhere: Secondary | ICD-10-CM | POA: Diagnosis present

## 2017-02-08 DIAGNOSIS — E785 Hyperlipidemia, unspecified: Secondary | ICD-10-CM | POA: Diagnosis present

## 2017-02-08 DIAGNOSIS — J810 Acute pulmonary edema: Secondary | ICD-10-CM | POA: Diagnosis not present

## 2017-02-08 DIAGNOSIS — K219 Gastro-esophageal reflux disease without esophagitis: Secondary | ICD-10-CM | POA: Diagnosis present

## 2017-02-08 DIAGNOSIS — Z992 Dependence on renal dialysis: Secondary | ICD-10-CM | POA: Diagnosis not present

## 2017-02-08 DIAGNOSIS — E1140 Type 2 diabetes mellitus with diabetic neuropathy, unspecified: Secondary | ICD-10-CM | POA: Diagnosis present

## 2017-02-08 DIAGNOSIS — E8770 Fluid overload, unspecified: Secondary | ICD-10-CM | POA: Diagnosis present

## 2017-02-08 DIAGNOSIS — I132 Hypertensive heart and chronic kidney disease with heart failure and with stage 5 chronic kidney disease, or end stage renal disease: Secondary | ICD-10-CM | POA: Diagnosis present

## 2017-02-08 DIAGNOSIS — Z79899 Other long term (current) drug therapy: Secondary | ICD-10-CM | POA: Diagnosis not present

## 2017-02-08 DIAGNOSIS — Z961 Presence of intraocular lens: Secondary | ICD-10-CM | POA: Diagnosis present

## 2017-02-08 DIAGNOSIS — Z9842 Cataract extraction status, left eye: Secondary | ICD-10-CM | POA: Diagnosis not present

## 2017-02-08 LAB — CBC
HCT: 29.8 % — ABNORMAL LOW (ref 36.0–46.0)
Hemoglobin: 9 g/dL — ABNORMAL LOW (ref 12.0–15.0)
MCH: 24.8 pg — ABNORMAL LOW (ref 26.0–34.0)
MCHC: 30.2 g/dL (ref 30.0–36.0)
MCV: 82.1 fL (ref 78.0–100.0)
PLATELETS: 490 10*3/uL — AB (ref 150–400)
RBC: 3.63 MIL/uL — ABNORMAL LOW (ref 3.87–5.11)
RDW: 15.8 % — AB (ref 11.5–15.5)
WBC: 5.7 10*3/uL (ref 4.0–10.5)

## 2017-02-08 LAB — RENAL FUNCTION PANEL
ALBUMIN: 2 g/dL — AB (ref 3.5–5.0)
Anion gap: 11 (ref 5–15)
BUN: 60 mg/dL — AB (ref 6–20)
CO2: 26 mmol/L (ref 22–32)
CREATININE: 4.6 mg/dL — AB (ref 0.44–1.00)
Calcium: 8 mg/dL — ABNORMAL LOW (ref 8.9–10.3)
Chloride: 96 mmol/L — ABNORMAL LOW (ref 101–111)
GFR calc Af Amer: 10 mL/min — ABNORMAL LOW (ref >60)
GFR, EST NON AFRICAN AMERICAN: 9 mL/min — AB (ref >60)
GLUCOSE: 179 mg/dL — AB (ref 65–99)
PHOSPHORUS: 7.1 mg/dL — AB (ref 2.5–4.6)
Potassium: 5.1 mmol/L (ref 3.5–5.1)
SODIUM: 133 mmol/L — AB (ref 135–145)

## 2017-02-08 LAB — ECHOCARDIOGRAM COMPLETE
HEIGHTINCHES: 61 in
WEIGHTICAEL: 1633.17 [oz_av]

## 2017-02-08 LAB — GLUCOSE, CAPILLARY
GLUCOSE-CAPILLARY: 158 mg/dL — AB (ref 65–99)
Glucose-Capillary: 144 mg/dL — ABNORMAL HIGH (ref 65–99)
Glucose-Capillary: 129 mg/dL — ABNORMAL HIGH (ref 65–99)
Glucose-Capillary: 97 mg/dL (ref 65–99)

## 2017-02-08 NOTE — Progress Notes (Signed)
PROGRESS NOTE    Frances Reeves  ZOX:[id number] DOB: [birth year] DOA: 02/07/2017 PCP: Patient, No Pcp Per     Brief Narrative:  66 y/o woman admitted from home on 10/20 due to SOB and CP presumed due to fluid overload in a patient with ESRD who has missed HD sessions. Admission requested. Renal called for emergent HD, however this was not performed until 10/21 due to staffing issues. She is spanish-speaking only.   Assessment & Plan:   Active Problems:   DM (diabetes mellitus) (HCC)   ESRD (end stage renal disease) (HCC)   GERD (gastroesophageal reflux disease)   Anemia   History of CVA (cerebrovascular accident)   Chest pain   Shortness of breath   Acute Hypoxemic Respiratory Failure/Chest Pain/Acute Pulmonary Edema/ESRD -Due to volume overload in an ESRD patient. -CP is resolved after HD. Troponins have been negative. -Nephrology is planning an additional HS session for 10/22 to remove additional volume. -ECHO ordered with results pending. -Dialysis schedule is T-T-S. -Missed HD session yesterday and past Tuesday.  DM II -Well controlled, continue current regimen.  Hyperlipidemia -Continue statin.  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 -Stable at around 8; no need for transfusion -Aranesp on Tuesdays.   DVT prophylaxis: SQ heparin  Code Status: Full code Family Communication: patient only Disposition Plan: Home when ready; anticipate in 24-48 hours  Consultants:   Nephrology  Procedures:   HD  Antimicrobials:  Anti-infectives    None       Subjective: CP has resolved; SOB has improved  Objective: Vitals:   02/08/17 1100 02/08/17 1130 02/08/17 1200 02/08/17 1303  BP: 122/65 108/60 (!) 93/53 (!) 142/53  Pulse: 85 81 81 89  Resp:    18  Temp:    98.2 F (36.8 C)  TempSrc:    Oral  SpO2:    92%  Weight:      Height:        Intake/Output Summary (Last 24 hours) at 02/08/17 1553 Last data filed at 02/08/17 0930  Gross per 24 hour  Intake               120 ml  Output                0 ml  Net              120 ml   Filed Weights   02/07/17 1703 02/08/17 0500 02/08/17 0945  Weight: 45.7 kg (100 lb 12 oz) 46.2 kg (101 lb 14.4 oz) 46.3 kg (102 lb 1.2 oz)    Examination:  General exam: Alert, awake, oriented x 3 Respiratory system: Bibasilar crackles. Respiratory effort normal. Cardiovascular system:RRR. No murmurs, rubs, gallops. Gastrointestinal system: Abdomen is nondistended, soft and nontender. No organomegaly or masses felt. Normal bowel sounds heard. Central nervous system: Alert and oriented. No focal neurological deficits. Extremities: No C/C/E, +pedal pulses Skin: No rashes, lesions or ulcers Psychiatry: Judgement and insight appear normal. Mood & affect appropriate.     Data Reviewed: I have personally reviewed following labs and imaging studies  CBC:  Recent Labs Lab 02/02/17 1611 02/07/17 1230 02/08/17 1030  WBC 7.1 7.2 5.7  NEUTROABS 5.2 5.5  --   HGB 9.9* 9.4* 9.0*  HCT 33.2* 31.4* 29.8*  MCV 85.6 81.6 82.1  PLT 642* 480* 490*   Basic Metabolic Panel:  Recent Labs Lab 02/02/17 1611 02/07/17 1230 02/08/17 1030  NA 138 132* 133*  K 4.6 4.9 5.1  CL 100* 95* 96*  CO2  27 24 26   GLUCOSE 237* 187* 179*  BUN 41* 48* 60*  CREATININE 3.78* 4.06* 4.60*  CALCIUM 8.7* 7.8* 8.0*  PHOS  --   --  7.1*   GFR: Estimated Creatinine Clearance: 8.6 mL/min (A) (by C-G formula based on SCr of 4.6 mg/dL (H)). Liver Function Tests:  Recent Labs Lab 02/02/17 1611 02/08/17 1030  AST 24  --   ALT 25  --   ALKPHOS 592*  --   BILITOT 0.3  --   PROT 7.4  --   ALBUMIN 2.5* 2.0*   No results for input(s): LIPASE, AMYLASE in the last 168 hours. No results for input(s): AMMONIA in the last 168 hours. Coagulation Profile:  Recent Labs Lab 02/02/17 1611  INR 1.03   Cardiac Enzymes:  Recent Labs Lab 02/07/17 1230  TROPONINI <0.03   BNP (last 3 results) No results for input(s): PROBNP in the last 8760  hours. HbA1C:  Recent Labs  02/07/17 1735  HGBA1C 7.2*   CBG:  Recent Labs Lab 02/07/17 1733 02/07/17 2027 02/08/17 0743 02/08/17 1303  GLUCAP 107* 148* 144* 129*   Lipid Profile: No results for input(s): CHOL, HDL, LDLCALC, TRIG, CHOLHDL, LDLDIRECT in the last 72 hours. Thyroid Function Tests: No results for input(s): TSH, T4TOTAL, FREET4, T3FREE, THYROIDAB in the last 72 hours. Anemia Panel: No results for input(s): VITAMINB12, FOLATE, FERRITIN, TIBC, IRON, RETICCTPCT in the last 72 hours. Urine analysis:    Component Value Date/Time   COLORURINE STRAW (A) 07/08/2016 1148   APPEARANCEUR CLEAR 07/08/2016 1148   LABSPEC 1.020 07/08/2016 1148   PHURINE 6.0 07/08/2016 1148   GLUCOSEU >=500 (A) 07/08/2016 1148   HGBUR NEGATIVE 07/08/2016 1148   BILIRUBINUR NEGATIVE 07/08/2016 1148   KETONESUR NEGATIVE 07/08/2016 1148   PROTEINUR >=300 (A) 07/08/2016 1148   UROBILINOGEN 0.2 02/20/2015 2223   NITRITE NEGATIVE 07/08/2016 1148   LEUKOCYTESUR NEGATIVE 07/08/2016 1148   Sepsis Labs: @LABRCNTIP (procalcitonin:4,lacticidven:4)  ) Recent Results (from the past 240 hour(s))  MRSA PCR Screening     Status: None   Collection Time: 02/07/17  5:52 PM  Result Value Ref Range Status   MRSA by PCR NEGATIVE NEGATIVE Final    Comment:        The GeneXpert MRSA Assay (FDA approved for NASAL specimens only), is one component of a comprehensive MRSA colonization surveillance program. It is not intended to diagnose MRSA infection nor to guide or monitor treatment for MRSA infections.          Radiology Studies: Dg Chest 2 View  Result Date: 02/07/2017 CLINICAL DATA:  66 year old female with shortness of breath and chest pain. EXAM: CHEST  2 VIEW COMPARISON:  Prior chest x-ray 11/24/2016 FINDINGS: Stable cardiomegaly. Worsened pulmonary vascular congestion now with mild interstitial edema. New small bilateral layering pleural effusions. A right IJ tunneled hemodialysis  catheter appears to be in good position with the tip in the distal SVC. No evidence of pneumothorax. Atherosclerotic calcifications are present in the transverse aorta. No acute osseous abnormality. IMPRESSION: 1. Cardiomegaly with pulmonary interstitial edema and small bilateral layering pleural effusions. Differential considerations include congestive heart failure and volume overload. 2. The tip of the right IJ approach tunneled hemodialysis catheter overlies the cavoatrial junction. 3.  Aortic Atherosclerosis (ICD10-170.0) Electronically Signed   By: Malachy Moan M.D.   On: 02/07/2017 13:37        Scheduled Meds: . aspirin  324 mg Oral Once  . [START ON 02/10/2017] darbepoetin (ARANESP) injection - DIALYSIS  150 mcg Intravenous Q Tue-HD  . [START ON 02/10/2017] doxercalciferol  2 mcg Intravenous Q T,Th,Sa-HD  . heparin  5,000 Units Subcutaneous Q8H  . insulin aspart  0-9 Units Subcutaneous TID WC  . simvastatin  20 mg Oral Daily   Continuous Infusions: . [START ON 02/10/2017] ferric gluconate (FERRLECIT/NULECIT) IV       LOS: 0 days    Time spent: 25 minutes. Greater than 50% of this time was spent in direct contact with the patient coordinating care.     [name],ESTELA, MD [facility] Pager [phone]  If 7PM-7AM, please contact night-coverage [email or web address] Password [email or web address] 02/08/2017, 3:53 PM

## 2017-02-08 NOTE — Progress Notes (Signed)
[facility] Progress Note  Subjective: was not dialyzed yest due to staffing issues. SOB a little better w/ O2 here. +cough, no CP  Vitals:   02/08/17 1005 02/08/17 1030 02/08/17 1100 02/08/17 1130  BP: (!) 148/72 (!) 117/58 122/65 108/60  Pulse: 82 85 85 81  Resp:      Temp:      TempSrc:      SpO2:      Weight:      Height:        Inpatient medications: . aspirin  324 mg Oral Once  . [START ON 02/10/2017] darbepoetin (ARANESP) injection - DIALYSIS  150 mcg Intravenous Q Tue-HD  . [START ON 02/10/2017] doxercalciferol  2 mcg Intravenous Q T,Th,Sa-HD  . heparin  5,000 Units Subcutaneous Q8H  . insulin aspart  0-9 Units Subcutaneous TID WC  . simvastatin  20 mg Oral Daily   . [START ON 02/10/2017] ferric gluconate (FERRLECIT/NULECIT) IV     acetaminophen, ALPRAZolam, gi cocktail, morphine injection, nitroGLYCERIN, ondansetron (ZOFRAN) IV  Exam: General: alert, Hispanic elderly Female  OX3, appropriate Neck: pos jvd Heart: RRR ,no m, r, g Lungs: Bilat crackles ,nonlabored breathing  Abdomen: soft, nt,nd Extremities: no pedal edema Skin: warm dry , no overt rash Neuro: alert,moves all extrem  Dialysis Access: R IJ Perm cath Pos bruit LUA AVF   Dialysis: AF TTS 4h   43.5kg   2/2.25   Heparin none?   LUA AVF -Mircera 225 mcg (last on 01/29/17 )  -Venofer 50 mg q wk  Hd -Hec 1mcg  Assessment: 1. Pulm edema / vol overload- uf on hd  ,fu wts to lower edw  2. ESRD -  HD TTS. Did not get HD last night due to staffing. HD today and reassess in am.  3. Hypertension/volume  - up 3kg with sig CHF on CXR, no LE edema.  4. Anemia  - hgb 9.4 ESA  Next on Tues 10/23 5. Metabolic bone disease -  Hec, no binder 6. Nutrition - Renal, carb mod , renal vit 7. DMtype 2 - per admit team   Plan - HD today, plan short HD Monday also, get vol down further.    Frances Moselleob Dorlisa Savino MD [facility] pager [phone]   02/08/2017, 12:00 PM    Recent Labs Lab  02/02/17 1611 02/07/17 1230 02/08/17 1030  NA 138 132* 133*  K 4.6 4.9 5.1  CL 100* 95* 96*  CO2 27 24 26   GLUCOSE 237* 187* 179*  BUN 41* 48* 60*  CREATININE 3.78* 4.06* 4.60*  CALCIUM 8.7* 7.8* 8.0*  PHOS  --   --  7.1*    Recent Labs Lab 02/02/17 1611 02/08/17 1030  AST 24  --   ALT 25  --   ALKPHOS 592*  --   BILITOT 0.3  --   PROT 7.4  --   ALBUMIN 2.5* 2.0*    Recent Labs Lab 02/02/17 1611 02/07/17 1230 02/08/17 1030  WBC 7.1 7.2 5.7  NEUTROABS 5.2 5.5  --   HGB 9.9* 9.4* 9.0*  HCT 33.2* 31.4* 29.8*  MCV 85.6 81.6 82.1  PLT 642* 480* 490*   Iron/TIBC/Ferritin/ %Sat No results found for: IRON, TIBC, FERRITIN, IRONPCTSAT

## 2017-02-09 LAB — GLUCOSE, CAPILLARY
Glucose-Capillary: 324 mg/dL — ABNORMAL HIGH (ref 65–99)
Glucose-Capillary: 114 mg/dL — ABNORMAL HIGH (ref 65–99)
Glucose-Capillary: 208 mg/dL — ABNORMAL HIGH (ref 65–99)

## 2017-02-09 NOTE — Progress Notes (Signed)
Used video interpreter to complete assessment and medication pass Pt denies pain at this time Oriented to room

## 2017-02-09 NOTE — Procedures (Signed)
I have personally attended this patient's dialysis session.   Goal 2.5 liters for further lowering of EDW (suspect will not get that - no peripheral edema but still crackles on lung exam) Pre weight 43.1 (prev EDW 43.5) Using TDC No heparin Today is extra TMT Usual is TTS  Frances Balynthia Tionne Carelli, MD [facility] [phone] Pager 02/09/2017, 8:14 AM

## 2017-02-09 NOTE — Progress Notes (Signed)
PROGRESS NOTE    Frances Reeves  WUJ:[id number] DOB: Aug 18, 1950 DOA: 02/07/2017 PCP: Patient, No Pcp Per     Brief Narrative:  66 y/o woman admitted from home on 10/20 due to SOB and CP presumed due to fluid overload in a patient with ESRD who has missed HD sessions. Admission requested. Renal called for emergent HD, however this was not performed until 10/21 due to staffing issues. She is spanish-speaking only.   Assessment & Plan:   Active Problems:   DM (diabetes mellitus) (HCC)   ESRD (end stage renal disease) (HCC)   GERD (gastroesophageal reflux disease)   Anemia   History of CVA (cerebrovascular accident)   Chest pain   Shortness of breath   Acute Hypoxemic Respiratory Failure/Chest Pain/Acute Pulmonary Edema/ESRD -Due to volume overload in an ESRD patient. -CP is resolved after HD. Troponins have been negative. -Nephrology is planning on HD session for 10/23, after which she can be discharged. -ECHO: EF 55-60% with grade 1 diastolic dysfunction and no WMA. -Dialysis schedule is T-T-S. -Missed HD session Saturday and past Tuesday.  DM II -labile, follow, no plans to change regimen today.  Hyperlipidemia -Continue statin.  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 -Stable at around 9; no need for transfusion -Aranesp at the discretion of nephrology   DVT prophylaxis: SQ heparin  Code Status: Full code Family Communication: patient only Disposition Plan: Home when ready; anticipate in 24 hours  Consultants:   Nephrology  Procedures:   HD  Antimicrobials:  Anti-infectives    None       Subjective: CP has resolved; SOB has resolved.  Objective: Vitals:   02/09/17 0930 02/09/17 0936 02/09/17 1031 02/09/17 1100  BP: (!) 101/53 124/60 (!) 133/58 (!) 135/49  Pulse: 85 86 77 95  Resp: 14 13 17 14   Temp:  98.4 F (36.9 C)  98.9 F (37.2 C)  TempSrc:  Oral  Oral  SpO2:  96%  99%  Weight:  40.5 kg (89 lb 4.6 oz)    Height:        Intake/Output Summary  (Last 24 hours) at 02/09/17 1639 Last data filed at 02/09/17 0936  Gross per 24 hour  Intake              360 ml  Output             2950 ml  Net            -2590 ml   Filed Weights   02/09/17 0643 02/09/17 0654 02/09/17 0936  Weight: 43.1 kg (95 lb) 43.3 kg (95 lb 7.4 oz) 40.5 kg (89 lb 4.6 oz)    Examination:  General exam: Alert, awake, oriented x 3 Respiratory system: Clear to auscultation. Respiratory effort normal. Cardiovascular system:RRR. No murmurs, rubs, gallops. Gastrointestinal system: Abdomen is nondistended, soft and nontender. No organomegaly or masses felt. Normal bowel sounds heard. Central nervous system: Alert and oriented. No focal neurological deficits. Extremities: No C/C/E, +pedal pulses Skin: No rashes, lesions or ulcers Psychiatry: Judgement and insight appear normal. Mood & affect appropriate.      Data Reviewed: I have personally reviewed following labs and imaging studies  CBC:  Recent Labs Lab 02/07/17 1230 02/08/17 1030  WBC 7.2 5.7  NEUTROABS 5.5  --   HGB 9.4* 9.0*  HCT 31.4* 29.8*  MCV 81.6 82.1  PLT 480* 490*   Basic Metabolic Panel:  Recent Labs Lab 02/07/17 1230 02/08/17 1030  NA 132* 133*  K 4.9 5.1  CL  95* 96*  CO2 24 26  GLUCOSE 187* 179*  BUN 48* 60*  CREATININE 4.06* 4.60*  CALCIUM 7.8* 8.0*  PHOS  --  7.1*   GFR: Estimated Creatinine Clearance: 7.5 mL/min (A) (by C-G formula based on SCr of 4.6 mg/dL (H)). Liver Function Tests:  Recent Labs Lab 02/08/17 1030  ALBUMIN 2.0*   No results for input(s): LIPASE, AMYLASE in the last 168 hours. No results for input(s): AMMONIA in the last 168 hours. Coagulation Profile: No results for input(s): INR, PROTIME in the last 168 hours. Cardiac Enzymes:  Recent Labs Lab 02/07/17 1230  TROPONINI <0.03   BNP (last 3 results) No results for input(s): PROBNP in the last 8760 hours. HbA1C:  Recent Labs  02/07/17 1735  HGBA1C 7.2*   CBG:  Recent Labs Lab  02/08/17 0743 02/08/17 1303 02/08/17 1637 02/08/17 2138 02/09/17 1212  GLUCAP 144* 129* 158* 97 324*   Lipid Profile: No results for input(s): CHOL, HDL, LDLCALC, TRIG, CHOLHDL, LDLDIRECT in the last 72 hours. Thyroid Function Tests: No results for input(s): TSH, T4TOTAL, FREET4, T3FREE, THYROIDAB in the last 72 hours. Anemia Panel: No results for input(s): VITAMINB12, FOLATE, FERRITIN, TIBC, IRON, RETICCTPCT in the last 72 hours. Urine analysis:    Component Value Date/Time   COLORURINE STRAW (A) 07/08/2016 1148   APPEARANCEUR CLEAR 07/08/2016 1148   LABSPEC 1.020 07/08/2016 1148   PHURINE 6.0 07/08/2016 1148   GLUCOSEU >=500 (A) 07/08/2016 1148   HGBUR NEGATIVE 07/08/2016 1148   BILIRUBINUR NEGATIVE 07/08/2016 1148   KETONESUR NEGATIVE 07/08/2016 1148   PROTEINUR >=300 (A) 07/08/2016 1148   UROBILINOGEN 0.2 02/20/2015 2223   NITRITE NEGATIVE 07/08/2016 1148   LEUKOCYTESUR NEGATIVE 07/08/2016 1148   Sepsis Labs: @LABRCNTIP (procalcitonin:4,lacticidven:4)  ) Recent Results (from the past 240 hour(s))  MRSA PCR Screening     Status: None   Collection Time: 02/07/17  5:52 PM  Result Value Ref Range Status   MRSA by PCR NEGATIVE NEGATIVE Final    Comment:        The GeneXpert MRSA Assay (FDA approved for NASAL specimens only), is one component of a comprehensive MRSA colonization surveillance program. It is not intended to diagnose MRSA infection nor to guide or monitor treatment for MRSA infections.          Radiology Studies: No results found.      Scheduled Meds: . aspirin  324 mg Oral Once  . [START ON 02/10/2017] darbepoetin (ARANESP) injection - DIALYSIS  150 mcg Intravenous Q Tue-HD  . [START ON 02/10/2017] doxercalciferol  2 mcg Intravenous Q T,Th,Sa-HD  . heparin  5,000 Units Subcutaneous Q8H  . insulin aspart  0-9 Units Subcutaneous TID WC  . simvastatin  20 mg Oral Daily   Continuous Infusions: . [START ON 02/10/2017] ferric gluconate  (FERRLECIT/NULECIT) IV       LOS: 1 day    Time spent: 25 minutes. Greater than 50% of this time was spent in direct contact with the patient coordinating care.     [name],[name], MD [facility] Pager [phone]  If 7PM-7AM, please contact night-coverage [email or web address] Password [email or web address] 02/09/2017, 4:39 PM

## 2017-02-09 NOTE — Progress Notes (Signed)
Pt is a alert and oriented, uneventful night with stable vitals. Extra day of dialysis treatment ordered Report given. Iv Lines dry and intact.

## 2017-02-09 NOTE — Progress Notes (Signed)
[facility] Rounding Note  Subjective:   Pt had HD yesterday off schedule and is getting HD again today in attempt to remove additional fluid Weight 43.2 post HD yesterday, 43.1 pre today with orders for 2.5 liters if tolerated (antipipate may not get that much) Still coughs with deep breathing On RA in HD   Vitals:   02/08/17 1303 02/08/17 2143 02/09/17 0134 02/09/17 0643  BP: (!) 142/53 (!) 136/51 (!) 142/58 (!) 128/55  Pulse: 89 85 87 83  Resp: 18 18  20   Temp: 98.2 F (36.8 C) 98 F (36.7 C) 97.6 F (36.4 C) 98.9 F (37.2 C)  TempSrc: Oral Oral Oral Oral  SpO2: 92% 92% 94% 95%  Weight:    43.1 kg (95 lb)  Height:        Inpatient medications: . aspirin  324 mg Oral Once  . [START ON 02/10/2017] darbepoetin (ARANESP) injection - DIALYSIS  150 mcg Intravenous Q Tue-HD  . [START ON 02/10/2017] doxercalciferol  2 mcg Intravenous Q T,Th,Sa-HD  . heparin  5,000 Units Subcutaneous Q8H  . insulin aspart  0-9 Units Subcutaneous TID WC  . simvastatin  20 mg Oral Daily   . [START ON 02/10/2017] ferric gluconate (FERRLECIT/NULECIT) IV     acetaminophen, ALPRAZolam, gi cocktail, morphine injection, nitroGLYCERIN, ondansetron (ZOFRAN) IV   Recent Labs Lab 02/02/17 1611 02/07/17 1230 02/08/17 1030  NA 138 132* 133*  K 4.6 4.9 5.1  CL 100* 95* 96*  CO2 27 24 26   GLUCOSE 237* 187* 179*  BUN 41* 48* 60*  CREATININE 3.78* 4.06* 4.60*  CALCIUM 8.7* 7.8* 8.0*  PHOS  --   --  7.1*    Recent Labs Lab 02/02/17 1611 02/08/17 1030  AST 24  --   ALT 25  --   ALKPHOS 592*  --   BILITOT 0.3  --   PROT 7.4  --   ALBUMIN 2.5* 2.0*    Recent Labs Lab 02/02/17 1611 02/07/17 1230 02/08/17 1030  WBC 7.1 7.2 5.7  NEUTROABS 5.2 5.5  --   HGB 9.9* 9.4* 9.0*  HCT 33.2* 31.4* 29.8*  MCV 85.6 81.6 82.1  PLT 642* 480* 490*    Dialysis: AF TTS 4h   43.5kg (will be lower at discharge)    2/2.25    Heparin none?   LUA AVF/R TDC -Mircera 225 mcg (last on  01/29/17 )  -Venofer 50 mg q wk  Hd -Hec 1mcg  Assessment/Plans: 1. Pulm edema / vol overload- Lowering EDW with serial HD. Pre weight 43.1 today. UF goal 2.5. Pre HD exam crackles bases and still coughs with deep breathing  2. ESRD -  HD TTS. Had HD Sunday off schedule, getting short treatment today, standard treatment tomorrow.  3. Anemia  - hgb 9.4 ESA  Next on Tues 10/23 4. Metabolic bone disease -  Hectorol, no binder but phos elevated. Add Ca acetate 5. Nutrition - Renal, carb mod , renal vit 6. DMtype 2 - per admit team   Frances Balynthia Shakeira Rhee, MD [facility] [phone] Pager 02/09/2017, 8:00 AM

## 2017-02-10 LAB — RENAL FUNCTION PANEL
ANION GAP: 11 (ref 5–15)
Albumin: 2.2 g/dL — ABNORMAL LOW (ref 3.5–5.0)
BUN: 31 mg/dL — ABNORMAL HIGH (ref 6–20)
CALCIUM: 8.2 mg/dL — AB (ref 8.9–10.3)
CO2: 26 mmol/L (ref 22–32)
CREATININE: 3.54 mg/dL — AB (ref 0.44–1.00)
Chloride: 96 mmol/L — ABNORMAL LOW (ref 101–111)
GFR calc Af Amer: 14 mL/min — ABNORMAL LOW (ref >60)
GFR calc non Af Amer: 12 mL/min — ABNORMAL LOW (ref >60)
GLUCOSE: 148 mg/dL — AB (ref 65–99)
PHOSPHORUS: 5.5 mg/dL — AB (ref 2.5–4.6)
Potassium: 4 mmol/L (ref 3.5–5.1)
Sodium: 133 mmol/L — ABNORMAL LOW (ref 135–145)

## 2017-02-10 LAB — CBC
HCT: 30.7 % — ABNORMAL LOW (ref 36.0–46.0)
Hemoglobin: 9.1 g/dL — ABNORMAL LOW (ref 12.0–15.0)
MCH: 24.3 pg — ABNORMAL LOW (ref 26.0–34.0)
MCHC: 29.6 g/dL — AB (ref 30.0–36.0)
MCV: 82.1 fL (ref 78.0–100.0)
PLATELETS: 493 10*3/uL — AB (ref 150–400)
RBC: 3.74 MIL/uL — ABNORMAL LOW (ref 3.87–5.11)
RDW: 15.9 % — AB (ref 11.5–15.5)
WBC: 4.7 10*3/uL (ref 4.0–10.5)

## 2017-02-10 MED ORDER — DOXERCALCIFEROL 4 MCG/2ML IV SOLN
INTRAVENOUS | Status: AC
  Filled 2017-02-10: qty 2

## 2017-02-10 MED ORDER — DARBEPOETIN ALFA 150 MCG/0.3ML IJ SOSY
PREFILLED_SYRINGE | INTRAMUSCULAR | Status: AC
  Filled 2017-02-10: qty 0.3

## 2017-02-10 NOTE — Progress Notes (Signed)
[facility] Rounding Note  Subjective:  Had HD off schedule yesterday Have been able to lower EDW significantly Pre HD weight today down to 40.3 (old EDW 43.5 kg) On room air, lying flat in HD - today is regular schedule  Vitals:   02/10/17 0635 02/10/17 0659 02/10/17 0706 02/10/17 0730  BP: 140/65 (!) 155/76 (!) 156/71 (!) 145/71  Pulse: 89 90 90 85  Resp: 15 13 16 15   Temp: 98.3 F (36.8 C) 98.3 F (36.8 C)    TempSrc: Oral Oral    SpO2: 95%     Weight: 40.8 kg (90 lb)     Height:       Physical Examination VS as noted Spanish speaking, minimal English but indicates chest/breathing better Lungs clear anteriorly S1S2 No S3 Soft scaphoid non tender abdomen No LE edema whatsoever  Inpatient medications: . aspirin  324 mg Oral Once  . darbepoetin (ARANESP) injection - DIALYSIS  150 mcg Intravenous Q Tue-HD  . doxercalciferol  2 mcg Intravenous Q T,Th,Sa-HD  . heparin  5,000 Units Subcutaneous Q8H  . insulin aspart  0-9 Units Subcutaneous TID WC  . simvastatin  20 mg Oral Daily   . ferric gluconate (FERRLECIT/NULECIT) IV     acetaminophen, ALPRAZolam, gi cocktail, morphine injection, nitroGLYCERIN, ondansetron (ZOFRAN) IV   Recent Labs Lab 02/07/17 1230 02/08/17 1030  NA 132* 133*  K 4.9 5.1  CL 95* 96*  CO2 24 26  GLUCOSE 187* 179*  BUN 48* 60*  CREATININE 4.06* 4.60*  CALCIUM 7.8* 8.0*  PHOS  --  7.1*    Recent Labs Lab 02/08/17 1030  ALBUMIN 2.0*    Recent Labs Lab 02/07/17 1230 02/08/17 1030  WBC 7.2 5.7  NEUTROABS 5.5  --   HGB 9.4* 9.0*  HCT 31.4* 29.8*  MCV 81.6 82.1  PLT 480* 490*    Dialysis:  AF TTS 4h    43.5kg (will be lower at discharge)    2/2.25    Heparin none?    LUA AVF/R TDC -Mircera 225 mcg (last on 01/29/17 ) Dosed Aranesp 150 10/23 for Hb 9.0  -Venofer 50 mg q wk  Hd -Hec 1mcg  Assessment/Plans: 1. Pulm edema / vol overload- Lowering EDW with serial HD. Pre weight 40.3 today. UF goal 1 liter.  Post HD weight will be new EDW. On room air. I think would be OK for home after HD today. 2. ESRD -  HD TTS. Had HD Sunday and Monday serially with good vol off. Usual TMT today on her TTS schedule.  3. Anemia  - hgb 9.4 ESA  Next Aranesp due today 10/23 4. Metabolic bone disease -  Hectorol, no binder but phos elevated. Added Ca acetate 5. Nutrition - Renal, carb mod , renal vit 6. DMtype 2 - per admit team   Frances Balynthia Brynnley Dayrit, MD [facility] [phone] Pager 02/10/2017, 7:59 AM

## 2017-02-10 NOTE — Discharge Summary (Signed)
Physician Discharge Summary  Frances ChinaJuana A Reeves UXL:[id number] DOB: 10/03/1950 DOA: 02/07/2017  PCP: Patient, No Pcp Per  Admit date: 02/07/2017 Discharge date: 02/10/2017  Time spent: 45 minutes  Recommendations for Outpatient Follow-up:  -Will be discharged home today. -Advised to follow up with her HD center for her scheduled dialysis next Thursday.   Discharge Diagnoses:  Active Problems:   DM (diabetes mellitus) (HCC)   ESRD (end stage renal disease) (HCC)   GERD (gastroesophageal reflux disease)   Anemia   History of CVA (cerebrovascular accident)   Chest pain   Shortness of breath   Discharge Condition: Stable and improved  Filed Weights   02/09/17 0654 02/09/17 0936 02/10/17 0635  Weight: 43.3 kg (95 lb 7.4 oz) 40.5 kg (89 lb 4.6 oz) 40.8 kg (90 lb)    History of present illness:  As per Dr. Elvera LennoxGherghe on 10/20: Pleasant 66 year old female with history of end-stage renal disease and started on dialysis about 6 weeks ago, comes to the emergency room with chief complaint of chest pressure and shortness of breath, they have been progressively gradual over the last 3 weeks but this morning it was so severe that she has had to come to the emergency room.  She also been complaining of chest pressure while she is catch her breath, no abdominal pain, no nausea or vomiting.  Denies any lower extremity swelling.  Hospital Course:   Acute Hypoxemic Respiratory Failure/Chest Pain/Acute Pulmonary Edema/ESRD -Due to volume overload in an ESRD patient. -CP is resolved after HD. Troponins have been negative. -Received emergent HD on 10/22 and was also dialyzed 10/23 to keep her on schedule. -ECHO: EF 55-60% with grade 1 diastolic dysfunction and no WMA. -Dialysis schedule is T-T-S. -Missed HD session Saturday and past Tuesday. -Advised on importance of compliance with HD.  DM II -Fair control. -Continue amaryl. -May need further adjustments as an  OP.  Hyperlipidemia -Continue statin.  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 -Stable at around 9; no need for transfusion -Aranesp at the discretion of nephrology weekly with HD.  Procedures:  As above   Consultations:  Nephrology  Discharge Instructions  Discharge Instructions    Diet - low sodium heart healthy    Complete by:  As directed    Increase activity slowly    Complete by:  As directed      Allergies as of 02/10/2017   No Known Allergies     Medication List    STOP taking these medications   metFORMIN 500 MG tablet Commonly known as:  GLUCOPHAGE   oxyCODONE-acetaminophen 5-325 MG tablet Commonly known as:  ROXICET   traMADol 50 MG tablet Commonly known as:  ULTRAM     TAKE these medications   acetaminophen 650 MG CR tablet Commonly known as:  TYLENOL Take 650 mg by mouth every 8 (eight) hours as needed for pain.   glimepiride 4 MG tablet Commonly known as:  AMARYL Take 4 mg by mouth daily.   simvastatin 20 MG tablet Commonly known as:  ZOCOR Take 20 mg by mouth daily.      No Known Allergies    The results of significant diagnostics from this hospitalization (including imaging, microbiology, ancillary and laboratory) are listed below for reference.    Significant Diagnostic Studies: Dg Chest 2 View  Result Date: 02/07/2017 CLINICAL DATA:  66 year old female with shortness of breath and chest pain. EXAM: CHEST  2 VIEW COMPARISON:  Prior chest x-ray 11/24/2016 FINDINGS: Stable cardiomegaly. Worsened pulmonary vascular congestion  now with mild interstitial edema. New small bilateral layering pleural effusions. A right IJ tunneled hemodialysis catheter appears to be in good position with the tip in the distal SVC. No evidence of pneumothorax. Atherosclerotic calcifications are present in the transverse aorta. No acute osseous abnormality. IMPRESSION: 1. Cardiomegaly with pulmonary interstitial edema and small bilateral layering pleural effusions.  Differential considerations include congestive heart failure and volume overload. 2. The tip of the right IJ approach tunneled hemodialysis catheter overlies the cavoatrial junction. 3.  Aortic Atherosclerosis (ICD10-170.0) Electronically Signed   By: Malachy Moan M.D.   On: 02/07/2017 13:37   Dg Shoulder Left  Result Date: 01/30/2017 CLINICAL DATA:  Left proximal humerus/shoulder pain x 2 weeks, patient is unsble to move the arm, patient started dialysis 11/2016, basilic vein transposition left arm 11/24/16, there is no injury EXAM: LEFT SHOULDER - 2+ VIEW COMPARISON:  Chest radiograph, 11/24/2016 FINDINGS: No fracture or dislocation. There are advanced arthropathic changes of the glenohumeral joint. There is irregular resorption of the humeral head and glenoid. Remottling of the humeral head and glenoid is noted and there are small marginal osteophytes from the inferior margins of the glenoid and humeral head. Joint space is widened to 9 mm. On the prior chest radiograph, there is narrowing of the glenohumeral joint space with subchondral sclerosis and cystic change, as well as marginal osteophytes. This represents a significant interval progression of disease. AC joint is normally aligned. There are minor AC joint osteoarthritic changes. Bones are diffusely demineralized. Soft tissues are unremarkable. IMPRESSION: 1. No fracture or dislocation. 2. 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 Given the history of basalic vein transposition, an ischemic related arthropathy should be considered. Alternatively, this has the appearance of a progressive inflammatory arthropathy. Septic arthritis is also possible and should be considered if there are consistent clinical symptoms. Electronically Signed   By: Amie Portland M.D.   On: 01/30/2017 09:42   Dg Humerus Left  Result Date: 01/30/2017 CLINICAL DATA:  Left  proximal humerus/shoulder pain x 2 weeks, patient is unsble to move the arm, patient started dialysis 11/2016, basilic vein transposition left arm 11/24/16, there is no injury EXAM: LEFT HUMERUS - 2+ VIEW COMPARISON:  Chest radiograph, 11/24/2016 FINDINGS: No fracture or dislocation. There are advanced arthropathic changes of the left glenohumeral joint with subchondral resorption and remottling of the glenoid and humeral head as well as marginal spurs. This has increased in severity when compared to the prior chest radiograph. Mild AC joint osteoarthritis. Bones are demineralized.  Elbow joint is normally aligned. Soft tissues are unremarkable. IMPRESSION: 1. No fracture or dislocation. 2. 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 Suspect an inflammatory arthropathy versus an ischemic related arthropathy. Septic arthritis is not excluded and should be considered if there are consistent clinical findings. Electronically Signed   By: Amie Portland M.D.   On: 01/30/2017 09:39    Microbiology: Recent Results (from the past 240 hour(s))  MRSA PCR Screening     Status: None   Collection Time: 02/07/17  5:52 PM  Result Value Ref Range Status   MRSA by PCR NEGATIVE NEGATIVE Final    Comment:        The GeneXpert MRSA Assay (FDA approved for NASAL specimens only), is one component of a comprehensive MRSA colonization surveillance program. It is not intended to diagnose MRSA infection nor to  guide or monitor treatment for MRSA infections.      Labs: Basic Metabolic Panel:  Recent Labs Lab 02/07/17 1230 02/08/17 1030 02/10/17 0737  NA 132* 133* 133*  K 4.9 5.1 4.0  CL 95* 96* 96*  CO2 24 26 26   GLUCOSE 187* 179* 148*  BUN 48* 60* 31*  CREATININE 4.06* 4.60* 3.54*  CALCIUM 7.8* 8.0* 8.2*  PHOS  --  7.1* 5.5*   Liver Function Tests:  Recent Labs Lab 02/08/17 1030 02/10/17 0737  ALBUMIN 2.0* 2.2*    No results for input(s): LIPASE, AMYLASE in the last 168 hours. No results for input(s): AMMONIA in the last 168 hours. CBC:  Recent Labs Lab 02/07/17 1230 02/08/17 1030 02/10/17 0803  WBC 7.2 5.7 4.7  NEUTROABS 5.5  --   --   HGB 9.4* 9.0* 9.1*  HCT 31.4* 29.8* 30.7*  MCV 81.6 82.1 82.1  PLT 480* 490* 493*   Cardiac Enzymes:  Recent Labs Lab 02/07/17 1230  TROPONINI <0.03   BNP: BNP (last 3 results) No results for input(s): BNP in the last 8760 hours.  ProBNP (last 3 results) No results for input(s): PROBNP in the last 8760 hours.  CBG:  Recent Labs Lab 02/08/17 1637 02/08/17 2138 02/09/17 1212 02/09/17 1646 02/09/17 2033  GLUCAP 158* 97 324* 114* 208*       Signed:  Chaya Jan  [facility] Pager: [phone] 02/10/2017, 9:50 AM

## 2017-02-10 NOTE — Procedures (Signed)
I have personally attended this patient's dialysis session.  Pre HD weight 40.3 (good UF with HD yesterday) Will have lower EDW at discharge Today is regular day UF goal 1 liter today  Labs pending 2K bath until back  Camille Balynthia Sandy Haye, MD [facility] [phone] Pager 02/10/2017, 7:45 AM

## 2017-02-10 NOTE — Care Management Note (Signed)
Case Management Note  Patient Details  Name: Frances Reeves MRN: [id number] Date of Birth: 10/16/1950  Subjective/Objective:   Acute Hypoxic Respiratory Failure                Action/Plan:Patient lives at home with daughter; CM talked to patient with her daughter at the bedside; she states that she has a PCP but cannot think of his name; has private insurance with Medicare; [facility] choice [facility]; DME - walker and cane at home; [facility]; CleatonMary with [facility] called for arrangements.  Expected Discharge Date:  02/10/17               Expected Discharge Plan:  Home w Home Health Services  In-House Referral:   Dietitian   Discharge planning Services  CM Consult  Choice offered to:  Adult Children  HH Arranged:  RN, PT [facility]  Status of Service:  In process, will continue to follow  Reola MosherChandler, Jnai Snellgrove L, RN,MHA,BSN [phone] 02/10/2017, 12:23 PM

## 2017-05-13 ENCOUNTER — Emergency Department (HOSPITAL_COMMUNITY): Payer: Medicare Other

## 2017-05-13 ENCOUNTER — Inpatient Hospital Stay (HOSPITAL_COMMUNITY)
Admission: EM | Admit: 2017-05-13 | Discharge: 2017-05-17 | DRG: 189 | Disposition: A | Discharge status: Home Health | Payer: Medicare Other | Attending: Internal Medicine | Admitting: Internal Medicine

## 2017-05-13 ENCOUNTER — Encounter (HOSPITAL_COMMUNITY): Payer: Self-pay

## 2017-05-13 DIAGNOSIS — R0602 Shortness of breath: Secondary | ICD-10-CM | POA: Diagnosis present

## 2017-05-13 DIAGNOSIS — J810 Acute pulmonary edema: Secondary | ICD-10-CM

## 2017-05-13 DIAGNOSIS — Z9841 Cataract extraction status, right eye: Secondary | ICD-10-CM

## 2017-05-13 DIAGNOSIS — I120 Hypertensive chronic kidney disease with stage 5 chronic kidney disease or end stage renal disease: Secondary | ICD-10-CM | POA: Diagnosis present

## 2017-05-13 DIAGNOSIS — E8770 Fluid overload, unspecified: Secondary | ICD-10-CM | POA: Diagnosis present

## 2017-05-13 DIAGNOSIS — Z9115 Patient's noncompliance with renal dialysis: Secondary | ICD-10-CM | POA: Diagnosis not present

## 2017-05-13 DIAGNOSIS — Z9842 Cataract extraction status, left eye: Secondary | ICD-10-CM | POA: Diagnosis not present

## 2017-05-13 DIAGNOSIS — N2581 Secondary hyperparathyroidism of renal origin: Secondary | ICD-10-CM | POA: Diagnosis not present

## 2017-05-13 DIAGNOSIS — K219 Gastro-esophageal reflux disease without esophagitis: Secondary | ICD-10-CM | POA: Diagnosis present

## 2017-05-13 DIAGNOSIS — N186 End stage renal disease: Secondary | ICD-10-CM | POA: Diagnosis present

## 2017-05-13 DIAGNOSIS — Z8673 Personal history of transient ischemic attack (TIA), and cerebral infarction without residual deficits: Secondary | ICD-10-CM | POA: Diagnosis not present

## 2017-05-13 DIAGNOSIS — E8779 Other fluid overload: Secondary | ICD-10-CM

## 2017-05-13 DIAGNOSIS — E875 Hyperkalemia: Secondary | ICD-10-CM | POA: Diagnosis present

## 2017-05-13 DIAGNOSIS — J811 Chronic pulmonary edema: Secondary | ICD-10-CM | POA: Diagnosis present

## 2017-05-13 DIAGNOSIS — E1165 Type 2 diabetes mellitus with hyperglycemia: Secondary | ICD-10-CM

## 2017-05-13 DIAGNOSIS — Z992 Dependence on renal dialysis: Secondary | ICD-10-CM | POA: Diagnosis not present

## 2017-05-13 DIAGNOSIS — J9601 Acute respiratory failure with hypoxia: Secondary | ICD-10-CM | POA: Diagnosis present

## 2017-05-13 DIAGNOSIS — Z91158 Patient's noncompliance with renal dialysis for other reason: Secondary | ICD-10-CM

## 2017-05-13 DIAGNOSIS — Z961 Presence of intraocular lens: Secondary | ICD-10-CM | POA: Diagnosis present

## 2017-05-13 DIAGNOSIS — J189 Pneumonia, unspecified organism: Secondary | ICD-10-CM | POA: Diagnosis present

## 2017-05-13 DIAGNOSIS — IMO0002 Reserved for concepts with insufficient information to code with codable children: Secondary | ICD-10-CM

## 2017-05-13 DIAGNOSIS — Z79899 Other long term (current) drug therapy: Secondary | ICD-10-CM | POA: Diagnosis not present

## 2017-05-13 DIAGNOSIS — E1122 Type 2 diabetes mellitus with diabetic chronic kidney disease: Secondary | ICD-10-CM | POA: Diagnosis present

## 2017-05-13 DIAGNOSIS — Z23 Encounter for immunization: Secondary | ICD-10-CM

## 2017-05-13 LAB — CBC WITH DIFFERENTIAL/PLATELET
BASOS PCT: 0 %
Basophils Absolute: 0 10*3/uL (ref 0.0–0.1)
EOS PCT: 1 %
Eosinophils Absolute: 0.1 10*3/uL (ref 0.0–0.7)
HEMATOCRIT: 40.2 % (ref 36.0–46.0)
Hemoglobin: 12.6 g/dL (ref 12.0–15.0)
LYMPHS ABS: 1.5 10*3/uL (ref 0.7–4.0)
Lymphocytes Relative: 16 %
MCH: 27.5 pg (ref 26.0–34.0)
MCHC: 31.3 g/dL (ref 30.0–36.0)
MCV: 87.8 fL (ref 78.0–100.0)
MONO ABS: 0.5 10*3/uL (ref 0.1–1.0)
MONOS PCT: 5 %
Neutro Abs: 7 10*3/uL (ref 1.7–7.7)
Neutrophils Relative %: 78 %
Platelets: 301 10*3/uL (ref 150–400)
RBC: 4.58 MIL/uL (ref 3.87–5.11)
RDW: 21.5 % — AB (ref 11.5–15.5)
WBC: 9.1 10*3/uL (ref 4.0–10.5)

## 2017-05-13 LAB — BASIC METABOLIC PANEL
Anion gap: 17 — ABNORMAL HIGH (ref 5–15)
BUN: 118 mg/dL — AB (ref 6–20)
CALCIUM: 7.6 mg/dL — AB (ref 8.9–10.3)
CO2: 20 mmol/L — AB (ref 22–32)
Chloride: 103 mmol/L (ref 101–111)
Creatinine, Ser: 7.44 mg/dL — ABNORMAL HIGH (ref 0.44–1.00)
GFR calc Af Amer: 6 mL/min — ABNORMAL LOW (ref >60)
GFR, EST NON AFRICAN AMERICAN: 5 mL/min — AB (ref >60)
GLUCOSE: 233 mg/dL — AB (ref 65–99)
POTASSIUM: 6.5 mmol/L — AB (ref 3.5–5.1)
Sodium: 140 mmol/L (ref 135–145)

## 2017-05-13 LAB — I-STAT TROPONIN, ED: Troponin i, poc: 0.03 ng/mL (ref 0.00–0.08)

## 2017-05-13 LAB — INFLUENZA PANEL BY PCR (TYPE A & B)
INFLAPCR: NEGATIVE
INFLBPCR: NEGATIVE

## 2017-05-13 MED ORDER — ONDANSETRON HCL 4 MG/2ML IJ SOLN: 4.0000 mg | Freq: Four times a day (QID) | INTRAMUSCULAR | Status: DC | PRN

## 2017-05-13 MED ORDER — HEPARIN SODIUM (PORCINE) 5000 UNIT/ML IJ SOLN
5000.0000 [IU] | Freq: Three times a day (TID) | INTRAMUSCULAR | Status: DC
  Administered 2017-05-14 – 2017-05-17 (×10): 5000 [IU] via SUBCUTANEOUS
  Filled 2017-05-13 (×9): qty 1

## 2017-05-13 MED ORDER — ACETAMINOPHEN 650 MG RE SUPP: 650.0000 mg | Freq: Four times a day (QID) | RECTAL | Status: DC | PRN

## 2017-05-13 MED ORDER — VANCOMYCIN HCL 500 MG IV SOLR
500.0000 mg | INTRAVENOUS | Status: DC
  Filled 2017-05-13: qty 500

## 2017-05-13 MED ORDER — ONDANSETRON HCL 4 MG PO TABS: 4.0000 mg | ORAL_TABLET | Freq: Four times a day (QID) | ORAL | Status: DC | PRN

## 2017-05-13 MED ORDER — INSULIN ASPART 100 UNIT/ML ~~LOC~~ SOLN
5.0000 [IU] | Freq: Once | SUBCUTANEOUS | Status: AC
  Administered 2017-05-13: 5 [IU] via SUBCUTANEOUS
  Filled 2017-05-13: qty 1

## 2017-05-13 MED ORDER — VANCOMYCIN HCL IN DEXTROSE 750-5 MG/150ML-% IV SOLN
750.0000 mg | Freq: Once | INTRAVENOUS | Status: AC
  Administered 2017-05-13: 750 mg via INTRAVENOUS
  Filled 2017-05-13: qty 150

## 2017-05-13 MED ORDER — DEXTROSE 50 % IV SOLN
1.0000 | Freq: Once | INTRAVENOUS | Status: AC
  Administered 2017-05-13: 50 mL via INTRAVENOUS
  Filled 2017-05-13: qty 50

## 2017-05-13 MED ORDER — SODIUM CHLORIDE 0.9 % IV SOLN
1.0000 g | Freq: Once | INTRAVENOUS | Status: AC
  Administered 2017-05-13: 1 g via INTRAVENOUS
  Filled 2017-05-13: qty 10

## 2017-05-13 MED ORDER — ACETAMINOPHEN 325 MG PO TABS
650.0000 mg | ORAL_TABLET | Freq: Four times a day (QID) | ORAL | Status: DC | PRN
  Administered 2017-05-14 – 2017-05-15 (×2): 650 mg via ORAL
  Filled 2017-05-13: qty 2

## 2017-05-13 MED ORDER — INSULIN ASPART 100 UNIT/ML ~~LOC~~ SOLN
0.0000 [IU] | Freq: Three times a day (TID) | SUBCUTANEOUS | Status: DC
  Administered 2017-05-14: 2 [IU] via SUBCUTANEOUS

## 2017-05-13 MED ORDER — DEXTROSE 5 % IV SOLN
1.0000 g | INTRAVENOUS | Status: DC
  Administered 2017-05-13 – 2017-05-16 (×4): 1 g via INTRAVENOUS
  Filled 2017-05-13 (×5): qty 1

## 2017-05-13 NOTE — Progress Notes (Signed)
Pharmacy Antibiotic Note  Frances Reeves is a 67 y.o. female admitted on 05/13/2017 with pneumonia. She presented to the ED with SOB. Pharmacy has been consulted for Cefepime and vancomycin dosing. Patient has a history of CKD on T/T/S HD. She was last dialyzed on Saturday and missed her Tuesday HD. WBC wnl.   Plan: -Vancomycin 750 mg IV once, then vancomycin 500 mg IV with each HD session -Cefepime 1 gm IV Q 24 hours -Monitor CBC, cultures and clinical progress -VT at SS  Height: 4\' 10"  (147.3 cm) Weight: 87 lb (39.5 kg) IBW/kg (Calculated) : 40.9  Temp (24hrs), Avg:100 F (37.8 C), Min:100 F (37.8 C), Max:100 F (37.8 C)  Recent Labs  Lab 05/13/17 2018  WBC 9.1  CREATININE 7.44*    Estimated Creatinine Clearance: 4.5 mL/min (A) (by C-G formula based on SCr of 7.44 mg/dL (H)).    No Known Allergies  Antimicrobials this admission: Vanc 1/23 >>  Cefepime 1/23 >>   Dose adjustments this admission: None   Microbiology results:  Thank you for allowing pharmacy to be a part of this patient's care.  Vinnie LevelBenjamin Nazim Kadlec, PharmD., BCPS Clinical Pharmacist Pager [phone]

## 2017-05-13 NOTE — ED Triage Notes (Signed)
Pt arrived via [facility] from home c/o N/V/D for a couple days, non productive cough x3 days, SOB today.  Pt missed dialysis yesterday, Left arm restriction, arrives on 100% non-rebreather.  CBG 371

## 2017-05-13 NOTE — Progress Notes (Signed)
Pt placed on bipap missed dialysis today SOB working to breath while wearing NRB mask before switching to bipap..  Tolerating Bipap well at this time.  RT to monitor.

## 2017-05-13 NOTE — ED Provider Notes (Signed)
[facility] EMERGENCY DEPARTMENT Provider Note   CSN: [id number] Arrival date & time: 05/13/17  1922     History   Chief Complaint Chief Complaint  Patient presents with  . Shortness of Breath    HPI Frances Reeves is a 67 y.o. female.  The history is provided by the patient and medical records.    67 y.o. F with hx of anemia, CKD, DM, GERD, prior stroke, presenting to the ED for SOB.  States 3 days ago she started having diarrhea and nausea but no vomiting.  States 2 days ago she started having a fairly bad cough and feeling very SOB.  Cough is dry.  States she feels very "air hungry" and has been "struggling" to breath.  She is on HD, usually scheduled T, Thurs, Sat but missed yesterday as she was not feeling well.  States she was last dialyzed on Saturday, did have full session at that time.  Denies known sick contacts. Did get a flu vaccine this past season.  On EMS arrival patient was in mid 80's on RA, does not generally require O2.   Past Medical History:  Diagnosis Date  . Anemia   . Chronic kidney disease   . Diabetes mellitus without complication (HCC)   . GERD (gastroesophageal reflux disease)   . Stroke [facility])    in her 81's    Patient Active Problem List   Diagnosis Date Noted  . DM (diabetes mellitus) (HCC) 02/07/2017  . ESRD (end stage renal disease) (HCC) 02/07/2017  . GERD (gastroesophageal reflux disease) 02/07/2017  . Anemia 02/07/2017  . History of CVA (cerebrovascular accident) 02/07/2017  . Chest pain 02/07/2017  . Shortness of breath 02/07/2017    Past Surgical History:  Procedure Laterality Date  . APPENDECTOMY    . BASCILIC VEIN TRANSPOSITION Left 11/24/2016   Procedure: FIRST STAGE BASCILIC VEIN TRANSPOSITION;  Surgeon: Maeola Harman, MD;  Location: [facility] OR;  Service: Vascular;  Laterality: Left;  . EYE SURGERY Bilateral    cataract surgery with lens implant  . INSERTION OF DIALYSIS CATHETER Right 11/24/2016   Procedure: INSERTION OF TUNNELED DIALYSIS CATHETER;  Surgeon: Maeola Harman, MD;  Location: [facility] OR;  Service: Vascular;  Laterality: Right;    OB History    No data available       Home Medications    Prior to Admission medications   Medication Sig Start Date End Date Taking? Authorizing Provider  acetaminophen (TYLENOL) 650 MG CR tablet Take 650 mg by mouth every 8 (eight) hours as needed for pain.    [provider]  glimepiride (AMARYL) 4 MG tablet Take 4 mg by mouth daily. 11/03/16   [provider]  simvastatin (ZOCOR) 20 MG tablet Take 20 mg by mouth daily. 11/03/16   [provider]    Family History History reviewed. No pertinent family history.  Social History Social History   Tobacco Use  . Smoking status: Never Smoker  . Smokeless tobacco: Never Used  Substance Use Topics  . Alcohol use: No  . Drug use: No     Allergies   Patient has no known allergies.   Review of Systems Review of Systems  Respiratory: Positive for cough and shortness of breath.   Gastrointestinal: Positive for diarrhea and nausea.  All other systems reviewed and are negative.    Physical Exam Updated Vital Signs BP (!) 167/82   Pulse (!) 110   Resp (!) 38   Ht 4'  10" (1.473 m)   Wt 39.5 kg (87 lb)   SpO2 99%   BMI 18.18 kg/m   Physical Exam  Constitutional: She is oriented to person, place, and time. She appears well-developed and well-nourished.  HENT:  Head: Normocephalic and atraumatic.  Right Ear: Tympanic membrane and ear canal normal.  Left Ear: Tympanic membrane and ear canal normal.  Nose: Nose normal.  Mouth/Throat: Uvula is midline and oropharynx is clear and moist.  Eyes: Conjunctivae and EOM are normal. Pupils are equal, round, and reactive to light.  Neck: Normal range of motion. JVD present.  Cardiovascular: Regular rhythm and normal heart sounds. Tachycardia present.  Pulmonary/Chest: Effort normal and breath sounds  normal.  On NRB, appears air hungry and struggling to breath, lung sounds are junky  Abdominal: Soft. Bowel sounds are normal.  Musculoskeletal: Normal range of motion.  Neurological: She is alert and oriented to person, place, and time.  Skin: Skin is warm and dry.  Psychiatric: She has a normal mood and affect.  Nursing note and vitals reviewed.    ED Treatments / Results  Labs (all labs ordered are listed, but only abnormal results are displayed) Labs Reviewed  CBC WITH DIFFERENTIAL/PLATELET - Abnormal; Notable for the following components:      Result Value   RDW 21.5 (*)    All other components within normal limits  BASIC METABOLIC PANEL - Abnormal; Notable for the following components:   Potassium 6.5 (*)    CO2 20 (*)    Glucose, Bld 233 (*)    BUN 118 (*)    Creatinine, Ser 7.44 (*)    Calcium 7.6 (*)    GFR calc non Af Amer 5 (*)    GFR calc Af Amer 6 (*)    Anion gap 17 (*)    All other components within normal limits  INFLUENZA PANEL BY PCR (TYPE A & B)  CBC  BASIC METABOLIC PANEL  I-STAT TROPONIN, ED    EKG  EKG Interpretation None       Radiology Dg Chest Portable 1 View  Result Date: 05/13/2017 CLINICAL DATA:  Shortness of breath.  History of diabetes. EXAM: PORTABLE CHEST 1 VIEW COMPARISON:  02/07/2017; 11/24/2016 FINDINGS: Grossly unchanged enlarged cardiac silhouette and mediastinal contours. Interval removal of right jugular approach dialysis catheter. The pulmonary vasculature is less distinct than present examination with cephalization of flow. Persistent at least small sized bilateral effusions with associated bibasilar heterogeneous/consolidative opacities, left greater than right. No new focal airspace opacities. No pneumothorax. No acute osseus abnormalities. IMPRESSION: Suspected pulmonary edema with at least small sized effusions and associated bibasilar opacities, left greater than right, atelectasis versus infiltrate. A follow-up chest  radiograph in 3 to 4 weeks after treatment is recommended to ensure resolution. Electronically Signed   By: Simonne ComeJohn  Watts M.D.   On: 05/13/2017 20:47    Procedures Procedures (including critical care time)  CRITICAL CARE Performed by: Garlon HatchetLisa M Tammara Massing   Total critical care time: 45 minutes  Critical care time was exclusive of separately billable procedures and treating other patients.  Critical care was necessary to treat or prevent imminent or life-threatening deterioration.  Critical care was time spent personally by me on the following activities: development of treatment plan with patient and/or surrogate as well as nursing, discussions with consultants, evaluation of patient's response to treatment, examination of patient, obtaining history from patient or surrogate, ordering and performing treatments and interventions, ordering and review of laboratory studies, ordering and review of  radiographic studies, pulse oximetry and re-evaluation of patient's condition.   Medications Ordered in ED Medications  ceFEPIme (MAXIPIME) 1 g in dextrose 5 % 50 mL IVPB (not administered)  vancomycin (VANCOCIN) IVPB 750 mg/150 ml premix (not administered)  vancomycin (VANCOCIN) 500 mg in sodium chloride 0.9 % 100 mL IVPB (not administered)  acetaminophen (TYLENOL) tablet 650 mg (not administered)    Or  acetaminophen (TYLENOL) suppository 650 mg (not administered)  ondansetron (ZOFRAN) tablet 4 mg (not administered)    Or  ondansetron (ZOFRAN) injection 4 mg (not administered)  heparin injection 5,000 Units (not administered)  insulin aspart (novoLOG) injection 0-9 Units (not administered)  insulin aspart (novoLOG) injection 5 Units (5 Units Subcutaneous Given 05/13/17 2223)  dextrose 50 % solution 50 mL (50 mLs Intravenous Given 05/13/17 2224)  calcium gluconate 1 g in sodium chloride 0.9 % 100 mL IVPB (0 g Intravenous Stopped 05/13/17 2248)     Initial Impression / Assessment and Plan / ED Course   I have reviewed the triage vital signs and the nursing notes.  Pertinent labs & imaging results that were available during my care of the patient were reviewed by me and considered in my medical decision making (see chart for details).  67 year old female here with shortness of breath.  Has had some nausea and diarrhea for the past 3 days but developed cough and shortness of breath 2 days ago.  She did miss dialysis yesterday as she was not feeling well.  On initial evaluation patient's breathing is very labored and she appears air hungry.  She has noted JVD.  She remains on nonrebreather as her oxygen saturations were in the mid 80s on room air.  Will transition to BiPAP.  Obtain screening labs, portable chest x-ray, EKG.  9:55 PM Patient's potassium has come back at 6.5 without visible hemolysis.  EKG with some peaked t-waves.  Will start treatment for hyperkalemia with insulin, D50, calcium gluconate.  Nephrology consulted.  Chest x-ray also with fluid overload and questionable superimposed pneumonia.  Will start on antibiotics for this.  Patient will require admission.  9:59 PM Spoke with nephrology-- will try to dialyze tonight, if not will be on first chair in morning.  Patient will be admitted to medicine service for ongoing care.  Final Clinical Impressions(s) / ED Diagnoses   Final diagnoses:  Shortness of breath  Other hypervolemia    ED Discharge Orders    None       Garlon Hatchet, PA-C 05/13/17 2310    Garlon Hatchet, PA-C 05/14/17 0052    Nira Conn, MD 05/14/17 [phone]

## 2017-05-13 NOTE — H&P (Signed)
History and Physical    Frances Reeves:[id number] DOB: 09/01/1950 DOA: 05/13/2017  PCP: System, Pcp Not In  Patient coming from: Home  I have personally briefly reviewed patient's old medical records in [facility]  Chief Complaint: SOB  HPI: Frances Reeves is a 67 y.o. female with medical history significant of ESRD dialysis TTS, DM2, prior stroke.  Missed dialysis yesterday, so hasnt had dialysis since at least Sat (though I suspect may have been longer).  Patient presents to the ED with c/o SOB.  3 days ago started having diarrhea and nausea but no vomiting, 2 days ago started having fairly bad cough and feeling SOB.  Cough is dry and non productive.  Did get flu vaccine this year.   ED Course: Tm 100.0.  CXR shows pulm edema, ? Bibasilar atelectasias vs infiltrate.  WBC 9.1.  Breathing improved with BIPAP.  EDP put patient on cefepime and vanc.  K 6.5  Got insulin, glucose, calcium, etc in ED.  Nephrology consulted, planing dialysis ASAP tonight.   Review of Systems: As per HPI otherwise 10 point review of systems negative.   Past Medical History:  Diagnosis Date  . Anemia   . Chronic kidney disease   . Diabetes mellitus without complication (HCC)   . GERD (gastroesophageal reflux disease)   . Stroke [facility])    in her 22's    Past Surgical History:  Procedure Laterality Date  . APPENDECTOMY    . BASCILIC VEIN TRANSPOSITION Left 11/24/2016   Procedure: FIRST STAGE BASCILIC VEIN TRANSPOSITION;  Surgeon: Maeola Harman, MD;  Location: [facility] OR;  Service: Vascular;  Laterality: Left;  . EYE SURGERY Bilateral    cataract surgery with lens implant  . INSERTION OF DIALYSIS CATHETER Right 11/24/2016   Procedure: INSERTION OF TUNNELED DIALYSIS CATHETER;  Surgeon: Maeola Harman, MD;  Location: [facility] OR;  Service: Vascular;  Laterality: Right;     reports that  has never smoked. she has never used smokeless tobacco. She reports that she does not  drink alcohol or use drugs.  No Known Allergies  History reviewed. No pertinent family history. No sick contacts in family.  Prior to Admission medications   Medication Sig Start Date End Date Taking? Authorizing Provider  acetaminophen (TYLENOL) 650 MG CR tablet Take 650 mg by mouth every 8 (eight) hours as needed for pain.    [provider]  glimepiride (AMARYL) 4 MG tablet Take 4 mg by mouth daily. 11/03/16   [provider]  simvastatin (ZOCOR) 20 MG tablet Take 20 mg by mouth daily. 11/03/16   [provider]    Physical Exam: Vitals:   05/13/17 2100 05/13/17 2130 05/13/17 2200 05/13/17 2230  BP: (!) 161/77 (!) 158/71 (!) 152/71 (!) 163/75  Pulse: (!) 107 (!) 105 (!) 104 (!) 108  Resp: 17 17 (!) 22 18  Temp:      TempSrc:      SpO2: 100% 98% 100% 100%  Weight:      Height:        Constitutional: NAD, calm, comfortable Eyes: PERRL, lids and conjunctivae normal ENMT: Mucous membranes are moist. Posterior pharynx clear of any exudate or lesions.Normal dentition.  Neck: normal, supple, no masses, no thyromegaly Respiratory: Crackles, on BIPAP Cardiovascular: Regular rate and rhythm, no murmurs / rubs / gallops. No extremity edema. 2+ pedal pulses. No carotid bruits.  Abdomen: no tenderness, no masses palpated. No hepatosplenomegaly. Bowel sounds positive.  Musculoskeletal: no clubbing / cyanosis.  No joint deformity upper and lower extremities. Good ROM, no contractures. Normal muscle tone.  Skin: no rashes, lesions, ulcers. No induration Neurologic: CN 2-12 grossly intact. Sensation intact, DTR normal. Strength 5/5 in all 4.  Psychiatric: Normal judgment and insight. Alert and oriented x 3. Normal mood.    Labs on Admission: I have personally reviewed following labs and imaging studies  CBC: Recent Labs  Lab 05/13/17 2018  WBC 9.1  NEUTROABS 7.0  HGB 12.6  HCT 40.2  MCV 87.8  PLT 301   Basic Metabolic Panel: Recent Labs  Lab  05/13/17 2018  NA 140  K 6.5*  CL 103  CO2 20*  GLUCOSE 233*  BUN 118*  CREATININE 7.44*  CALCIUM 7.6*   GFR: Estimated Creatinine Clearance: 4.5 mL/min (A) (by C-G formula based on SCr of 7.44 mg/dL (H)). Liver Function Tests: No results for input(s): AST, ALT, ALKPHOS, BILITOT, PROT, ALBUMIN in the last 168 hours. No results for input(s): LIPASE, AMYLASE in the last 168 hours. No results for input(s): AMMONIA in the last 168 hours. Coagulation Profile: No results for input(s): INR, PROTIME in the last 168 hours. Cardiac Enzymes: No results for input(s): CKTOTAL, CKMB, CKMBINDEX, TROPONINI in the last 168 hours. BNP (last 3 results) No results for input(s): PROBNP in the last 8760 hours. HbA1C: No results for input(s): HGBA1C in the last 72 hours. CBG: No results for input(s): GLUCAP in the last 168 hours. Lipid Profile: No results for input(s): CHOL, HDL, LDLCALC, TRIG, CHOLHDL, LDLDIRECT in the last 72 hours. Thyroid Function Tests: No results for input(s): TSH, T4TOTAL, FREET4, T3FREE, THYROIDAB in the last 72 hours. Anemia Panel: No results for input(s): VITAMINB12, FOLATE, FERRITIN, TIBC, IRON, RETICCTPCT in the last 72 hours. Urine analysis:    Component Value Date/Time   COLORURINE STRAW (A) 07/08/2016 1148   APPEARANCEUR CLEAR 07/08/2016 1148   LABSPEC 1.020 07/08/2016 1148   PHURINE 6.0 07/08/2016 1148   GLUCOSEU >=500 (A) 07/08/2016 1148   HGBUR NEGATIVE 07/08/2016 1148   BILIRUBINUR NEGATIVE 07/08/2016 1148   KETONESUR NEGATIVE 07/08/2016 1148   PROTEINUR >=300 (A) 07/08/2016 1148   UROBILINOGEN 0.2 02/20/2015 2223   NITRITE NEGATIVE 07/08/2016 1148   LEUKOCYTESUR NEGATIVE 07/08/2016 1148    Radiological Exams on Admission: Dg Chest Portable 1 View  Result Date: 05/13/2017 CLINICAL DATA:  Shortness of breath.  History of diabetes. EXAM: PORTABLE CHEST 1 VIEW COMPARISON:  02/07/2017; 11/24/2016 FINDINGS: Grossly unchanged enlarged cardiac silhouette and  mediastinal contours. Interval removal of right jugular approach dialysis catheter. The pulmonary vasculature is less distinct than present examination with cephalization of flow. Persistent at least small sized bilateral effusions with associated bibasilar heterogeneous/consolidative opacities, left greater than right. No new focal airspace opacities. No pneumothorax. No acute osseus abnormalities. IMPRESSION: Suspected pulmonary edema with at least small sized effusions and associated bibasilar opacities, left greater than right, atelectasis versus infiltrate. A follow-up chest radiograph in 3 to 4 weeks after treatment is recommended to ensure resolution. Electronically Signed   By: Simonne Come M.D.   On: 05/13/2017 20:47    EKG: Independently reviewed.  Assessment/Plan Principal Problem:   Acute respiratory failure with hypoxia (HCC) Active Problems:   DM (diabetes mellitus) (HCC)   ESRD (end stage renal disease) (HCC)   Hyperkalemia, diminished renal excretion   Noncompliance of patient with renal dialysis (HCC)   Pulmonary edema    1. Acute resp failure with hypoxia - pulm edema favored over HCAP 1. Will leave the cefepime and  vanc alone for the moment, but other than T 100.0 not clear that patient has HCAP.  Re-evaluate respiratory status after dialysis tomorrow. 2. BIPAP 3. Plan Dialysis ASAP 2. Hyperkalemia - 1. Got temp measures in ED 2. Repeat BMP in AM 3. Dialysis planned ASAP (hopefully tonight). 3. DM2 - 1. Sensitive SSI AC  DVT prophylaxis: Heparin [place] Code Status: Full Family Communication: No family in room Disposition Plan: Home after admit Consults called: Nephrology called by EDP Admission status: Admit to inpatient - inpatient status for likely multiple days of dialysis, resp failure requiring BIPAP.   Hillary BowGARDNER, JARED M. DO [facility] Pager [phone]  If 7AM-7PM, please contact day team taking care of patient [email or web address] Password  [email or web address]  05/13/2017, 10:50 PM

## 2017-05-13 NOTE — ED Notes (Signed)
With family interpreting, pt presents with a non-productive cough and n/v/d for the past 3 days. It is also reported that the pt missed dialysis yesterday, 05/12/17.

## 2017-05-14 ENCOUNTER — Other Ambulatory Visit: Payer: Self-pay

## 2017-05-14 DIAGNOSIS — J189 Pneumonia, unspecified organism: Secondary | ICD-10-CM | POA: Diagnosis not present

## 2017-05-14 DIAGNOSIS — N186 End stage renal disease: Secondary | ICD-10-CM | POA: Diagnosis not present

## 2017-05-14 DIAGNOSIS — J9601 Acute respiratory failure with hypoxia: Secondary | ICD-10-CM | POA: Diagnosis not present

## 2017-05-14 DIAGNOSIS — E875 Hyperkalemia: Secondary | ICD-10-CM | POA: Diagnosis not present

## 2017-05-14 DIAGNOSIS — J811 Chronic pulmonary edema: Secondary | ICD-10-CM | POA: Diagnosis not present

## 2017-05-14 DIAGNOSIS — Z9115 Patient's noncompliance with renal dialysis: Secondary | ICD-10-CM | POA: Diagnosis not present

## 2017-05-14 LAB — CBC WITH DIFFERENTIAL/PLATELET
BASOS PCT: 0 %
Basophils Absolute: 0 10*3/uL (ref 0.0–0.1)
EOS ABS: 0 10*3/uL (ref 0.0–0.7)
Eosinophils Relative: 0 %
HCT: 36.7 % (ref 36.0–46.0)
Hemoglobin: 11.6 g/dL — ABNORMAL LOW (ref 12.0–15.0)
LYMPHS ABS: 1 10*3/uL (ref 0.7–4.0)
Lymphocytes Relative: 13 %
MCH: 27.5 pg (ref 26.0–34.0)
MCHC: 31.6 g/dL (ref 30.0–36.0)
MCV: 87 fL (ref 78.0–100.0)
MONO ABS: 0.5 10*3/uL (ref 0.1–1.0)
Monocytes Relative: 7 %
NEUTROS ABS: 6.2 10*3/uL (ref 1.7–7.7)
Neutrophils Relative %: 80 %
PLATELETS: 262 10*3/uL (ref 150–400)
RBC: 4.22 MIL/uL (ref 3.87–5.11)
RDW: 21.4 % — ABNORMAL HIGH (ref 11.5–15.5)
WBC: 7.7 10*3/uL (ref 4.0–10.5)

## 2017-05-14 LAB — RENAL FUNCTION PANEL
Albumin: 2.7 g/dL — ABNORMAL LOW (ref 3.5–5.0)
Anion gap: 16 — ABNORMAL HIGH (ref 5–15)
BUN: 125 mg/dL — AB (ref 6–20)
CHLORIDE: 106 mmol/L (ref 101–111)
CO2: 18 mmol/L — AB (ref 22–32)
Calcium: 7.7 mg/dL — ABNORMAL LOW (ref 8.9–10.3)
Creatinine, Ser: 6.72 mg/dL — ABNORMAL HIGH (ref 0.44–1.00)
GFR calc Af Amer: 7 mL/min — ABNORMAL LOW (ref >60)
GFR, EST NON AFRICAN AMERICAN: 6 mL/min — AB (ref >60)
Glucose, Bld: 92 mg/dL (ref 65–99)
POTASSIUM: 5.8 mmol/L — AB (ref 3.5–5.1)
Phosphorus: 6.4 mg/dL — ABNORMAL HIGH (ref 2.5–4.6)
Sodium: 140 mmol/L (ref 135–145)

## 2017-05-14 LAB — CBC
HEMATOCRIT: 35.6 % — AB (ref 36.0–46.0)
Hemoglobin: 11 g/dL — ABNORMAL LOW (ref 12.0–15.0)
MCH: 26.8 pg (ref 26.0–34.0)
MCHC: 30.9 g/dL (ref 30.0–36.0)
MCV: 86.8 fL (ref 78.0–100.0)
Platelets: 286 10*3/uL (ref 150–400)
RBC: 4.1 MIL/uL (ref 3.87–5.11)
RDW: 21.5 % — AB (ref 11.5–15.5)
WBC: 6.6 10*3/uL (ref 4.0–10.5)

## 2017-05-14 LAB — BASIC METABOLIC PANEL
Anion gap: 16 — ABNORMAL HIGH (ref 5–15)
BUN: 142 mg/dL — ABNORMAL HIGH (ref 6–20)
CALCIUM: 7.6 mg/dL — AB (ref 8.9–10.3)
CO2: 18 mmol/L — AB (ref 22–32)
CREATININE: 6.74 mg/dL — AB (ref 0.44–1.00)
Chloride: 105 mmol/L (ref 101–111)
GFR calc non Af Amer: 6 mL/min — ABNORMAL LOW (ref >60)
GFR, EST AFRICAN AMERICAN: 7 mL/min — AB (ref >60)
GLUCOSE: 135 mg/dL — AB (ref 65–99)
Potassium: 6.6 mmol/L (ref 3.5–5.1)
Sodium: 139 mmol/L (ref 135–145)

## 2017-05-14 LAB — GLUCOSE, CAPILLARY
GLUCOSE-CAPILLARY: 212 mg/dL — AB (ref 65–99)
Glucose-Capillary: 156 mg/dL — ABNORMAL HIGH (ref 65–99)
Glucose-Capillary: 95 mg/dL (ref 65–99)
Glucose-Capillary: 192 mg/dL — ABNORMAL HIGH (ref 65–99)

## 2017-05-14 LAB — MRSA PCR SCREENING: MRSA BY PCR: NEGATIVE

## 2017-05-14 LAB — PROCALCITONIN: Procalcitonin: 4.66 ng/mL

## 2017-05-14 MED ORDER — INSULIN ASPART 100 UNIT/ML IV SOLN
10.0000 [IU] | Freq: Once | INTRAVENOUS | Status: AC
  Administered 2017-05-14: 10 [IU] via INTRAVENOUS

## 2017-05-14 MED ORDER — NEPRO/CARBSTEADY PO LIQD
237.0000 mL | Freq: Three times a day (TID) | ORAL | Status: DC
  Administered 2017-05-14 – 2017-05-17 (×8): 237 mL via ORAL
  Filled 2017-05-14 (×14): qty 237

## 2017-05-14 MED ORDER — LIDOCAINE HCL (PF) 1 % IJ SOLN: 5.0000 mL | INTRAMUSCULAR | Status: DC | PRN

## 2017-05-14 MED ORDER — DOXERCALCIFEROL 4 MCG/2ML IV SOLN
INTRAVENOUS | Status: AC
  Filled 2017-05-14: qty 2

## 2017-05-14 MED ORDER — HEPARIN SODIUM (PORCINE) 1000 UNIT/ML DIALYSIS
1000.0000 [IU] | INTRAMUSCULAR | Status: DC | PRN
Start: 2017-05-14 — End: 2017-05-14

## 2017-05-14 MED ORDER — HEPARIN SODIUM (PORCINE) 1000 UNIT/ML DIALYSIS: 1400.0000 [IU] | Freq: Once | INTRAMUSCULAR | Status: DC

## 2017-05-14 MED ORDER — DEXTROSE 50 % IV SOLN
1.0000 | Freq: Once | INTRAVENOUS | Status: AC
  Administered 2017-05-14: 50 mL via INTRAVENOUS
  Filled 2017-05-14: qty 50

## 2017-05-14 MED ORDER — ACETAMINOPHEN 325 MG PO TABS
ORAL_TABLET | ORAL | Status: AC
  Filled 2017-05-14: qty 2

## 2017-05-14 MED ORDER — SODIUM CHLORIDE 0.9 % IV SOLN
100.0000 mL | INTRAVENOUS | Status: DC | PRN
Start: 2017-05-14 — End: 2017-05-14

## 2017-05-14 MED ORDER — SODIUM CHLORIDE 0.9 % IV SOLN
1.0000 g | Freq: Once | INTRAVENOUS | Status: AC
  Administered 2017-05-14: 1 g via INTRAVENOUS
  Filled 2017-05-14: qty 10

## 2017-05-14 MED ORDER — ALTEPLASE 2 MG IJ SOLR: 2.0000 mg | Freq: Once | INTRAMUSCULAR | Status: DC | PRN

## 2017-05-14 MED ORDER — DOXERCALCIFEROL 4 MCG/2ML IV SOLN
2.0000 ug | INTRAVENOUS | Status: DC
  Administered 2017-05-14 – 2017-05-16 (×2): 2 ug via INTRAVENOUS
  Filled 2017-05-14: qty 2

## 2017-05-14 MED ORDER — SODIUM CHLORIDE 0.9 % IV SOLN: 100.0000 mL | INTRAVENOUS | Status: DC | PRN

## 2017-05-14 MED ORDER — SODIUM BICARBONATE 8.4 % IV SOLN
50.0000 meq | Freq: Once | INTRAVENOUS | Status: AC
  Administered 2017-05-14: 50 meq via INTRAVENOUS
  Filled 2017-05-14: qty 50

## 2017-05-14 MED ORDER — INSULIN ASPART 100 UNIT/ML IV SOLN
5.0000 [IU] | Freq: Once | INTRAVENOUS | Status: AC
  Administered 2017-05-14: 5 [IU] via INTRAVENOUS

## 2017-05-14 MED ORDER — PENTAFLUOROPROP-TETRAFLUOROETH EX AERO: 1.0000 "application " | INHALATION_SPRAY | CUTANEOUS | Status: DC | PRN

## 2017-05-14 MED ORDER — RENA-VITE PO TABS
1.0000 | ORAL_TABLET | Freq: Every day | ORAL | Status: DC
  Administered 2017-05-14 – 2017-05-16 (×3): 1 via ORAL
  Filled 2017-05-14 (×3): qty 1

## 2017-05-14 MED ORDER — LIDOCAINE-PRILOCAINE 2.5-2.5 % EX CREA: 1.0000 "application " | TOPICAL_CREAM | CUTANEOUS | Status: DC | PRN

## 2017-05-14 MED ORDER — CALCIUM ACETATE (PHOS BINDER) 667 MG PO CAPS
667.0000 mg | ORAL_CAPSULE | Freq: Three times a day (TID) | ORAL | Status: DC
  Administered 2017-05-14 – 2017-05-17 (×8): 667 mg via ORAL
  Filled 2017-05-14 (×10): qty 1

## 2017-05-14 MED ORDER — DEXTROSE 50 % IV SOLN: 1.0000 | Freq: Once | INTRAVENOUS | Status: DC

## 2017-05-14 MED ORDER — ORAL CARE MOUTH RINSE
15.0000 mL | Freq: Two times a day (BID) | OROMUCOSAL | Status: DC
  Administered 2017-05-14 – 2017-05-17 (×4): 15 mL via OROMUCOSAL

## 2017-05-14 MED ORDER — VANCOMYCIN HCL IN DEXTROSE 500-5 MG/100ML-% IV SOLN
INTRAVENOUS | Status: AC
  Administered 2017-05-14: 500 mg
  Filled 2017-05-14: qty 100

## 2017-05-14 NOTE — ED Notes (Addendum)
Dr. Lanae BoastGarner requested the pt's morning labs be drawn early.

## 2017-05-14 NOTE — Progress Notes (Deleted)
[facility] Progress Note   Subjective:   See while on Dialysis and Bipap.  C/os headache.    Objective Vitals:   05/14/17 0700 05/14/17 0730 05/14/17 0800 05/14/17 0830  BP: 133/65 120/61 (!) 134/53 126/61  Pulse: 96 92 91 92  Resp: (!) 21 13 13 12   Temp:      TempSrc:      SpO2: 100% 100% 100% 100%  Weight:      Height:       Physical Exam General:NAD, thin chronically ill appearing female Heart:RRR Lungs: mostly CTAB, on Bipap Abdomen:soft, NTND Extremities:no edema Dialysis Access: LU AVF cannulated   Filed Weights   05/13/17 1928 05/14/17 0341 05/14/17 0635  Weight: 39.5 kg (87 lb) 46.2 kg (101 lb 13.6 oz) 46 kg (101 lb 6.6 oz)    Intake/Output Summary (Last 24 hours) at 05/14/2017 0849 Last data filed at 05/14/2017 0441 Gross per 24 hour  Intake 490 ml  Output -  Net 490 ml    Additional Objective Labs: Basic Metabolic Panel: Recent Labs  Lab 05/13/17 2018 05/14/17 0200 05/14/17 0630  NA 140 139 140  K 6.5* 6.6* 5.8*  CL 103 105 106  CO2 20* 18* 18*  GLUCOSE 233* 135* 92  BUN 118* 142* 125*  CREATININE 7.44* 6.74* 6.72*  CALCIUM 7.6* 7.6* 7.7*  PHOS  --   --  6.4*   Liver Function Tests: Recent Labs  Lab 05/14/17 0630  ALBUMIN 2.7*   CBC: Recent Labs  Lab 05/13/17 2018 05/14/17 0210 05/14/17 0630  WBC 9.1 7.7 6.6  NEUTROABS 7.0 6.2  --   HGB 12.6 11.6* 11.0*  HCT 40.2 36.7 35.6*  MCV 87.8 87.0 86.8  PLT 301 262 286   CBG: Recent Labs  Lab 05/14/17 0528  GLUCAP 156*   Studies/Results: Dg Chest Portable 1 View  Result Date: 05/13/2017 CLINICAL DATA:  Shortness of breath.  History of diabetes. EXAM: PORTABLE CHEST 1 VIEW COMPARISON:  02/07/2017; 11/24/2016 FINDINGS: Grossly unchanged enlarged cardiac silhouette and mediastinal contours. Interval removal of right jugular approach dialysis catheter. The pulmonary vasculature is less distinct than present examination with cephalization of flow. Persistent at least  small sized bilateral effusions with associated bibasilar heterogeneous/consolidative opacities, left greater than right. No new focal airspace opacities. No pneumothorax. No acute osseus abnormalities. IMPRESSION: Suspected pulmonary edema with at least small sized effusions and associated bibasilar opacities, left greater than right, atelectasis versus infiltrate. A follow-up chest radiograph in 3 to 4 weeks after treatment is recommended to ensure resolution. Electronically Signed   By: Simonne ComeJohn  Watts M.D.   On: 05/13/2017 20:47    Medications: . sodium chloride    . sodium chloride    . ceFEPime (MAXIPIME) IV Stopped (05/14/17 0007)  . vancomycin     . heparin  1,400 Units Dialysis Once in dialysis  . heparin  5,000 Units Subcutaneous Q8H  . insulin aspart  0-9 Units Subcutaneous TID WC  . vancomycin        Dialysis Orders: TTS - AF EDW 40kg, 2K 2.25Ca, 400/800 Hectorol 2mcg IV qHD 1400 Unit bolus Heparin Venofer 50mg  qwk LU AVF  Assessment/Plan: 1. Hyperkalemia 2/2 missed HD - Improved, HD now 2. Acute Resp Failure - HCAP vs Pulm edema - on ABX. HD for volume removal. Per primary 3. ESRD - HD now, will continue per regular schedule  4. Anemia of CKD- Hgb 11. No indication for ESA. 5. Secondary hyperparathyroidism - mild hypocalcemia, P elevated. Continue binders  VDRA 6. HTN/volume - BP elevated, should improve with HD. Will need standing weight to better assess EDW.  Titrate down volume as tolerated.  7. Nutrition - Renal diet with Fluid restriction, renavite, Nepro TID 8. DM 9. CASHD w/ h/o CVA   Virgina Norfolk, PA-C [facility] Pager: [phone] 05/14/2017,8:49 AM  LOS: 1 day

## 2017-05-14 NOTE — Progress Notes (Signed)
CRITICAL VALUE ALERT  Critical Value:  Potassium 6.6  Date & Time Notied:  0400  Provider Notified: Kirtland BouchardK. Schorr  Orders Received/Actions taken: orders made

## 2017-05-14 NOTE — Progress Notes (Signed)
Patient transported from ED E48 to 3M01 with no complications.

## 2017-05-14 NOTE — Progress Notes (Signed)
Pre HD assessment. Orders reviewed. Labs reviewed. Patient stable for HD

## 2017-05-14 NOTE — Progress Notes (Signed)
I see Repeat K is 6.6.  Have ordered Insulin, D50, bicarb.

## 2017-05-14 NOTE — Care Management Note (Addendum)
Case Management Note  Patient Details  Name: Frances Reeves MRN: [id number] Date of Birth: 08/06/1950  Subjective/Objective:    From home, missed HD, presents with hyperkalemia, acute resp failure, anemia of CKD, secondary hyperparathyroidism, htn, dm.  Patient has a PCP but she does not know his name and she states she will be seeing another MD at the same office.  She uses a cane at home.  She has had HHRN and HHPT with [facility] and would like to have them again for RN and PT.  [facility]  She has dialysis on Tue, Thur and Sat and would like for them to come out on the days she does not have Dialysis.             Action/Plan: [facility].   Expected Discharge Date:                  Expected Discharge Plan:  Home/Self Care  In-House Referral:     Discharge planning Services  CM Consult  Post Acute Care Choice:    Choice offered to:     DME Arranged:    DME Agency:     HH Arranged:    HH Agency:     Status of Service:  In process, will continue to follow  If discussed at Long Length of Stay Meetings, dates discussed:    Additional Comments:  Leone Havenaylor, Zaraya Delauder Clinton, RN 05/14/2017, 12:10 PM

## 2017-05-14 NOTE — Consult Note (Signed)
Reason for Consult: ESRD and hyperkalemia Referring Physician: Dr. Alcario Drought  Chief Complaint: SOB  Assessment/Plan: 1. ESRD/Hyperkalemia secondary to missed dialysis - Agree with protocol for hyperkalemia. - On for dialysis first thing this AM w/ 1K bath for 1st 2hrs, then 2K 2. Dyspnea secondary to volume overload - EDW is 41kg and lowest she has left HD recently is at 40.9 kg - Even though she appears to be lower than her EDW we will reweigh on floor scale at HD and take off 2 L as tolerated. 3. Hypertension - restart home meds + UF from HD will help as well. 4. DM     5. CASHD w/ h/o CVA 6. ?HCAP - tx w/ cefepime and vanco.    HPI: Frances Reeves is an 67 y.o. female ESRD TTS @ [place] [facility] with Dr. Moshe Cipro w/ last HD Saturday. She missed dialysis Tuesday and subsequently presented last night to the ED with dyspnea, persistent cough productive of whitish sputum, denies fevers or rigors. She has also had nausea and diarrhea for 3 days. She denies CP, abdominal pain, headaches or visual disturbances. She was found to be hyperkalemic and received protocol in the ED. There were mildly peaked t-waves but no bradycardia or widening of the QRS complex. Her dyspnea improved on bipap after initially sat'ing in the mid 80's on RA.  ROS Pertinent items are noted in HPI.  Chemistry and CBC: Creatinine, Ser  Date/Time Value Ref Range Status  05/14/2017 02:00 AM 6.74 (H) 0.44 - 1.00 mg/dL Final  05/13/2017 08:18 PM 7.44 (H) 0.44 - 1.00 mg/dL Final  02/10/2017 07:37 AM 3.54 (H) 0.44 - 1.00 mg/dL Final  02/08/2017 10:30 AM 4.60 (H) 0.44 - 1.00 mg/dL Final  02/07/2017 12:30 PM 4.06 (H) 0.44 - 1.00 mg/dL Final  02/02/2017 04:11 PM 3.78 (H) 0.44 - 1.00 mg/dL Final  07/08/2016 01:41 PM 2.00 (H) 0.44 - 1.00 mg/dL Final  07/08/2016 09:30 AM 2.13 (H) 0.44 - 1.00 mg/dL Final  02/20/2015 08:16 PM 1.32 (H) 0.44 - 1.00 mg/dL Final   Recent Labs  Lab 05/13/17 2018 05/14/17 0200  NA  140 139  K 6.5* 6.6*  CL 103 105  CO2 20* 18*  GLUCOSE 233* 135*  BUN 118* 142*  CREATININE 7.44* 6.74*  CALCIUM 7.6* 7.6*   Recent Labs  Lab 05/13/17 2018 05/14/17 0210  WBC 9.1 7.7  NEUTROABS 7.0 6.2  HGB 12.6 11.6*  HCT 40.2 36.7  MCV 87.8 87.0  PLT 301 262   Liver Function Tests: No results for input(s): AST, ALT, ALKPHOS, BILITOT, PROT, ALBUMIN in the last 168 hours. No results for input(s): LIPASE, AMYLASE in the last 168 hours. No results for input(s): AMMONIA in the last 168 hours. Cardiac Enzymes: No results for input(s): CKTOTAL, CKMB, CKMBINDEX, TROPONINI in the last 168 hours. Iron Studies: No results for input(s): IRON, TIBC, TRANSFERRIN, FERRITIN in the last 72 hours. PT/INR: @LABRCNTIP (inr:5)  Xrays/Other Studies: ) Results for orders placed or performed during the hospital encounter of 05/13/17 (from the past 48 hour(s))  CBC with Differential     Status: Abnormal   Collection Time: 05/13/17  8:18 PM  Result Value Ref Range   WBC 9.1 4.0 - 10.5 K/uL   RBC 4.58 3.87 - 5.11 MIL/uL   Hemoglobin 12.6 12.0 - 15.0 g/dL   HCT 40.2 36.0 - 46.0 %   MCV 87.8 78.0 - 100.0 fL   MCH 27.5 26.0 - 34.0 pg   MCHC 31.3 30.0 -  36.0 g/dL   RDW 21.5 (H) 11.5 - 15.5 %   Platelets 301 150 - 400 K/uL   Neutrophils Relative % 78 %   Lymphocytes Relative 16 %   Monocytes Relative 5 %   Eosinophils Relative 1 %   Basophils Relative 0 %   Neutro Abs 7.0 1.7 - 7.7 K/uL   Lymphs Abs 1.5 0.7 - 4.0 K/uL   Monocytes Absolute 0.5 0.1 - 1.0 K/uL   Eosinophils Absolute 0.1 0.0 - 0.7 K/uL   Basophils Absolute 0.0 0.0 - 0.1 K/uL   Smear Review MORPHOLOGY UNREMARKABLE   Basic metabolic panel     Status: Abnormal   Collection Time: 05/13/17  8:18 PM  Result Value Ref Range   Sodium 140 135 - 145 mmol/L   Potassium 6.5 (HH) 3.5 - 5.1 mmol/L    Comment: NO VISIBLE HEMOLYSIS CRITICAL RESULT CALLED TO, READ BACK BY AND VERIFIED WITHRoxy Cedar RN [phone] GREEN R    Chloride  103 101 - 111 mmol/L   CO2 20 (L) 22 - 32 mmol/L   Glucose, Bld 233 (H) 65 - 99 mg/dL   BUN 118 (H) 6 - 20 mg/dL   Creatinine, Ser 7.44 (H) 0.44 - 1.00 mg/dL   Calcium 7.6 (L) 8.9 - 10.3 mg/dL   GFR calc non Af Amer 5 (L) >60 mL/min   GFR calc Af Amer 6 (L) >60 mL/min    Comment: (NOTE) The eGFR has been calculated using the CKD EPI equation. This calculation has not been validated in all clinical situations. eGFR's persistently <60 mL/min signify possible Chronic Kidney Disease.    Anion gap 17 (H) 5 - 15  Influenza panel by PCR (type A & B)     Status: None   Collection Time: 05/13/17  8:21 PM  Result Value Ref Range   Influenza A By PCR NEGATIVE NEGATIVE   Influenza B By PCR NEGATIVE NEGATIVE    Comment: (NOTE) The Xpert Xpress Flu assay is intended as an aid in the diagnosis of  influenza and should not be used as a sole basis for treatment.  This  assay is FDA approved for nasopharyngeal swab specimens only. Nasal  washings and aspirates are unacceptable for Xpert Xpress Flu testing.   I-stat troponin, ED     Status: None   Collection Time: 05/13/17  8:31 PM  Result Value Ref Range   Troponin i, poc 0.03 0.00 - 0.08 ng/mL   Comment 3            Comment: Due to the release kinetics of cTnI, a negative result within the first hours of the onset of symptoms does not rule out myocardial infarction with certainty. If myocardial infarction is still suspected, repeat the test at appropriate intervals.   Basic metabolic panel     Status: Abnormal   Collection Time: 05/14/17  2:00 AM  Result Value Ref Range   Sodium 139 135 - 145 mmol/L   Potassium 6.6 (HH) 3.5 - 5.1 mmol/L    Comment: CRITICAL RESULT CALLED TO, READ BACK BY AND VERIFIED WITH: PETTIFORD,A RN 05/14/2016 0322 JORDANS    Chloride 105 101 - 111 mmol/L   CO2 18 (L) 22 - 32 mmol/L   Glucose, Bld 135 (H) 65 - 99 mg/dL   BUN 142 (H) 6 - 20 mg/dL   Creatinine, Ser 6.74 (H) 0.44 - 1.00 mg/dL   Calcium 7.6 (L) 8.9  - 10.3 mg/dL   GFR calc non Af  Amer 6 (L) >60 mL/min   GFR calc Af Amer 7 (L) >60 mL/min    Comment: (NOTE) The eGFR has been calculated using the CKD EPI equation. This calculation has not been validated in all clinical situations. eGFR's persistently <60 mL/min signify possible Chronic Kidney Disease.    Anion gap 16 (H) 5 - 15  CBC with Differential     Status: Abnormal   Collection Time: 05/14/17  2:10 AM  Result Value Ref Range   WBC 7.7 4.0 - 10.5 K/uL   RBC 4.22 3.87 - 5.11 MIL/uL   Hemoglobin 11.6 (L) 12.0 - 15.0 g/dL   HCT 36.7 36.0 - 46.0 %   MCV 87.0 78.0 - 100.0 fL   MCH 27.5 26.0 - 34.0 pg   MCHC 31.6 30.0 - 36.0 g/dL   RDW 21.4 (H) 11.5 - 15.5 %   Platelets 262 150 - 400 K/uL   Neutrophils Relative % 80 %   Lymphocytes Relative 13 %   Monocytes Relative 7 %   Eosinophils Relative 0 %   Basophils Relative 0 %   Neutro Abs 6.2 1.7 - 7.7 K/uL   Lymphs Abs 1.0 0.7 - 4.0 K/uL   Monocytes Absolute 0.5 0.1 - 1.0 K/uL   Eosinophils Absolute 0.0 0.0 - 0.7 K/uL   Basophils Absolute 0.0 0.0 - 0.1 K/uL   Smear Review MORPHOLOGY UNREMARKABLE    Dg Chest Portable 1 View  Result Date: 05/13/2017 CLINICAL DATA:  Shortness of breath.  History of diabetes. EXAM: PORTABLE CHEST 1 VIEW COMPARISON:  02/07/2017; 11/24/2016 FINDINGS: Grossly unchanged enlarged cardiac silhouette and mediastinal contours. Interval removal of right jugular approach dialysis catheter. The pulmonary vasculature is less distinct than present examination with cephalization of flow. Persistent at least small sized bilateral effusions with associated bibasilar heterogeneous/consolidative opacities, left greater than right. No new focal airspace opacities. No pneumothorax. No acute osseus abnormalities. IMPRESSION: Suspected pulmonary edema with at least small sized effusions and associated bibasilar opacities, left greater than right, atelectasis versus infiltrate. A follow-up chest radiograph in 3 to 4 weeks  after treatment is recommended to ensure resolution. Electronically Signed   By: Sandi Mariscal M.D.   On: 05/13/2017 20:47    PMH:   Past Medical History:  Diagnosis Date  . Anemia   . Chronic kidney disease   . Diabetes mellitus without complication ([place])   . GERD (gastroesophageal reflux disease)   . Stroke [facility])    in her 55's    PSH:   Past Surgical History:  Procedure Laterality Date  . APPENDECTOMY    . BASCILIC VEIN TRANSPOSITION Left 11/24/2016   Procedure: FIRST STAGE BASCILIC VEIN TRANSPOSITION;  Surgeon: Waynetta Sandy, MD;  Location: [facility];  Service: Vascular;  Laterality: Left;  . EYE SURGERY Bilateral    cataract surgery with lens implant  . INSERTION OF DIALYSIS CATHETER Right 11/24/2016   Procedure: INSERTION OF TUNNELED DIALYSIS CATHETER;  Surgeon: Waynetta Sandy, MD;  Location: [facility];  Service: Vascular;  Laterality: Right;    Allergies: No Known Allergies  Medications:   Prior to Admission medications   Medication Sig Start Date End Date Taking? Authorizing Provider  acetaminophen (TYLENOL) 650 MG CR tablet Take 650 mg by mouth every 8 (eight) hours as needed for pain.    [provider]  glimepiride (AMARYL) 4 MG tablet Take 4 mg by mouth daily. 11/03/16   [provider]  simvastatin (ZOCOR) 20 MG tablet Take 20 mg by mouth  daily. 11/03/16   [provider]    Discontinued Meds:   Medications Discontinued During This Encounter  Medication Reason  . dextrose 50 % solution 50 mL     Social History:  reports that  has never smoked. she has never used smokeless tobacco. She reports that she does not drink alcohol or use drugs.  Family History:  History reviewed. No pertinent family history.  Blood pressure (!) 160/74, pulse (!) 103, temperature 98.5 F (36.9 C), temperature source Oral, resp. rate (!) 22, height 4' 10"  (1.473 m), weight 39.5 kg (87 lb), SpO2 100 %. General appearance: alert, cooperative and  appears stated age Head: Normocephalic, without obvious abnormality, atraumatic Eyes: negative Neck: no adenopathy, no carotid bruit, supple, symmetrical, trachea midline and thyroid not enlarged, symmetric, no tenderness/mass/nodules Back: symmetric, no curvature. ROM normal. No CVA tenderness. Resp: rales bibasilar and bilaterally Chest wall: no tenderness Cardio: regular rate and rhythm, S1, S2 normal, no murmur, click, rub or gallop GI: soft, non-tender; bowel sounds normal; no masses,  no organomegaly Extremities: extremities normal, atraumatic, no cyanosis or edema Pulses: 2+ and symmetric Skin: Skin color, texture, turgor normal. No rashes or lesions Lymph nodes: Cervical, supraclavicular, and axillary nodes normal. Neurologic: Grossly normal       Ellis Koffler, Hunt Oris, MD 05/14/2017, 4:44 AM

## 2017-05-14 NOTE — Progress Notes (Signed)
[place]. Pt arrived on unit transported by RN and Respiratory therapist. Pt alert and oriented, on Bipap. Pt put on cardiac monitoring, vital signs obtained, pt given CHG bath, skin assessment performed, and MRSA swab obtained. Will continue to monitor pt and wait for orders.

## 2017-05-14 NOTE — Progress Notes (Signed)
Still not gone to dialysis yet.  Will get her AM BMP now to see what potassium is doing.  EKG w/o findings of hyperkalemia right now though.

## 2017-05-14 NOTE — Progress Notes (Signed)
PROGRESS NOTE  Frances Reeves:[id number] DOB: Jun 07, 1950 DOA: 05/13/2017 PCP: System, Pcp Not In  HPI/Recap of past 65 hours: 67 year old female with past medical history of end-stage renal disease on hemodialysis (TTS), CVA and diabetes mellitus is also had a previous history of noncompliance with dialysis presented to the emergency room on the evening of 1/23 with complaints of nausea, vomiting and diarrhea plus cough and shortness of breath. Patient missed her previous dialysis session on 1/22 and states that she last had dialysis on 1/19, but that was an incomplete session. Found to be hypoxic with oxygen saturation on 80%.  Pt hyperkalemic with potassium of 6.6 and EKG with peaked T waves.  Given Kayexalate, insulin and D50. Patient requiring BiPAP to keep oxygen saturations greater than 90%. Chest x-ray noted fluid and questionable superimposed pneumonia. Given antibiotics. Patient placed in stepdown and taken for emergent dialysis this morning.  Placed in stepdown afterwards. Seen after dialysis and her respiratory status has much improved.  Patient currently at 100% on 3 L nasal cannula. She is somewhat somnolent, but otherwise denies shortness of breath, nausea or vomiting.  Assessment/Plan: Principal Problem:   Acute respiratory failure with hypoxia (HCC) secondary to volume overload, noncompliance with dialysis: Responded well to dialysis.  Continue to wean down oxygen. Transfer to renal floor. Continue antibiotic. Checking pro-calcitonin level, I'm clear she truly has pneumonia Active Problems:   DM (diabetes mellitus) (HCC): Sliding scale, holding home medications   ESRD (end stage renal disease) [facility]): Appreciate emergent dialysis.   Hyperkalemia, diminished renal excretion: Keep on telemetry.   Noncompliance of patient with renal dialysis [facility]): We'll have extensive discussion with patient.   Pulmonary edema Hypertension: Elevated blood pressure secondary to [place]  overload. Restarting home meds    Code Status: Full code  Family Communication: Left message for family  Disposition Plan: Potential discharge in next 1-2 days once potassium normalized, off of oxygen. Patient is already recovered much more quickly than anticipated    Consultants:  Nephrology   Procedures:  Emergent dialysis 1/24   Antimicrobials: IV cefepime and vancomycin 1/24-present  DVT prophylaxis:  subcutaneous heparin   Objective: Vitals:   05/14/17 1101 05/14/17 1120 05/14/17 1130 05/14/17 1225  BP: 138/62     Pulse: 89 92 91 90  Resp: 15 18 17 12   Temp:      TempSrc:      SpO2: 100% 100% 100% 100%  Weight:      Height:        Intake/Output Summary (Last 24 hours) at 05/14/2017 1544 Last data filed at 05/14/2017 1047 Gross per 24 hour  Intake 490 ml  Output 2317 ml  Net -1827 ml   Filed Weights   05/14/17 0341 05/14/17 0635 05/14/17 1047  Weight: 46.2 kg (101 lb 13.6 oz) 46 kg (101 lb 6.6 oz) 43.7 kg (96 lb 5.5 oz)    Exam:   General:  Somnolent  HEENT: Normocephalic, atraumatic, mucous membranes are slightly dry  Neck: Supple, no JVD  Cardiovascular: regular rate and rhythm, S1 and S2   Respiratory: decreased breath sounds bibasilar, no wheezing   Abdomen: soft, nondistended, nontender, hypoactive bowel sounds   Musculoskeletal: no clubbing or cyanosis, 1+ pitting edema from the knees down   Skin: no skin breaks, tears or lesions  Psychiatry:  appropriate, no evidence of psychoses    Data Reviewed: CBC: Recent Labs  Lab 05/13/17 2018 05/14/17 0210 05/14/17 0630  WBC 9.1 7.7 6.6  NEUTROABS 7.0 6.2  --  HGB 12.6 11.6* 11.0*  HCT 40.2 36.7 35.6*  MCV 87.8 87.0 86.8  PLT 301 262 286   Basic Metabolic Panel: Recent Labs  Lab 05/13/17 2018 05/14/17 0200 05/14/17 0630  NA 140 139 140  K 6.5* 6.6* 5.8*  CL 103 105 106  CO2 20* 18* 18*  GLUCOSE 233* 135* 92  BUN 118* 142* 125*  CREATININE 7.44* 6.74* 6.72*  CALCIUM 7.6*  7.6* 7.7*  PHOS  --   --  6.4*   GFR: Estimated Creatinine Clearance: 5.2 mL/min (A) (by C-G formula based on SCr of 6.72 mg/dL (H)). Liver Function Tests: Recent Labs  Lab 05/14/17 0630  ALBUMIN 2.7*   No results for input(s): LIPASE, AMYLASE in the last 168 hours. No results for input(s): AMMONIA in the last 168 hours. Coagulation Profile: No results for input(s): INR, PROTIME in the last 168 hours. Cardiac Enzymes: No results for input(s): CKTOTAL, CKMB, CKMBINDEX, TROPONINI in the last 168 hours. BNP (last 3 results) No results for input(s): PROBNP in the last 8760 hours. HbA1C: No results for input(s): HGBA1C in the last 72 hours. CBG: Recent Labs  Lab 05/14/17 0528 05/14/17 1135  GLUCAP 156* 95   Lipid Profile: No results for input(s): CHOL, HDL, LDLCALC, TRIG, CHOLHDL, LDLDIRECT in the last 72 hours. Thyroid Function Tests: No results for input(s): TSH, T4TOTAL, FREET4, T3FREE, THYROIDAB in the last 72 hours. Anemia Panel: No results for input(s): VITAMINB12, FOLATE, FERRITIN, TIBC, IRON, RETICCTPCT in the last 72 hours. Urine analysis:    Component Value Date/Time   COLORURINE STRAW (A) 07/08/2016 1148   APPEARANCEUR CLEAR 07/08/2016 1148   LABSPEC 1.020 07/08/2016 1148   PHURINE 6.0 07/08/2016 1148   GLUCOSEU >=500 (A) 07/08/2016 1148   HGBUR NEGATIVE 07/08/2016 1148   BILIRUBINUR NEGATIVE 07/08/2016 1148   KETONESUR NEGATIVE 07/08/2016 1148   PROTEINUR >=300 (A) 07/08/2016 1148   UROBILINOGEN 0.2 02/20/2015 2223   NITRITE NEGATIVE 07/08/2016 1148   LEUKOCYTESUR NEGATIVE 07/08/2016 1148   Sepsis Labs: @LABRCNTIP (procalcitonin:4,lacticidven:4)  ) Recent Results (from the past 240 hour(s))  MRSA PCR Screening     Status: None   Collection Time: 05/14/17  3:35 AM  Result Value Ref Range Status   MRSA by PCR NEGATIVE NEGATIVE Final    Comment:        The GeneXpert MRSA Assay (FDA approved for NASAL specimens only), is one component of  a comprehensive MRSA colonization surveillance program. It is not intended to diagnose MRSA infection nor to guide or monitor treatment for MRSA infections.       Studies: Dg Chest Portable 1 View  Result Date: 05/13/2017 CLINICAL DATA:  Shortness of breath.  History of diabetes. EXAM: PORTABLE CHEST 1 VIEW COMPARISON:  02/07/2017; 11/24/2016 FINDINGS: Grossly unchanged enlarged cardiac silhouette and mediastinal contours. Interval removal of right jugular approach dialysis catheter. The pulmonary vasculature is less distinct than present examination with cephalization of flow. Persistent at least small sized bilateral effusions with associated bibasilar heterogeneous/consolidative opacities, left greater than right. No new focal airspace opacities. No pneumothorax. No acute osseus abnormalities. IMPRESSION: Suspected pulmonary edema with at least small sized effusions and associated bibasilar opacities, left greater than right, atelectasis versus infiltrate. A follow-up chest radiograph in 3 to 4 weeks after treatment is recommended to ensure resolution. Electronically Signed   By: Simonne ComeJohn  Watts M.D.   On: 05/13/2017 20:47    Scheduled Meds: . calcium acetate  667 mg Oral TID WC  . doxercalciferol      .  doxercalciferol  2 mcg Intravenous Q T,Th,Sa-HD  . feeding supplement (NEPRO CARB STEADY)  237 mL Oral TID AC  . heparin  5,000 Units Subcutaneous Q8H  . insulin aspart  0-9 Units Subcutaneous TID WC  . mouth rinse  15 mL Mouth Rinse BID  . multivitamin  1 tablet Oral QHS    Continuous Infusions: . ceFEPime (MAXIPIME) IV Stopped (05/14/17 0007)  . vancomycin       LOS: 1 day     Hollice Espy, MD [facility]  To reach me or the doctor on call, go to: [email or web address] Password [facility]  05/14/2017, 3:44 PM

## 2017-05-14 NOTE — Progress Notes (Signed)
Patient arrived to HD unit on bipap. Currently in no distress. Somewhat lethargic. Responds appropriately. All vitals stable. HD initiated without issue via L AVF. Some mild redness to AVF noted in between arterial and venous cannulation areas. Will notify MD. 1k bath for 1 hour per orders. Repeat renal panel sent to lab. Continue to monitor patient closely.

## 2017-05-14 NOTE — Progress Notes (Signed)
Hemodialysis- Patient tolerated treatment well. Total UF 2.3L. Had one episode of cramping and was also medicated for headache while on treatment. Vitals remained stable throughout. Patient requested to keep bipap on throughout last hour of treatment when medicated for headache. Now wants off. RT to bedside for transport. Nasal Cannula applied. Report given to primary RN.

## 2017-05-14 NOTE — Procedures (Signed)
   I was present at this dialysis session, have reviewed the session itself and made  appropriate changes Vinson Moselleob Ronda Kazmi MD [facility] pager [phone]   05/14/2017, 12:31 PM

## 2017-05-15 DIAGNOSIS — R0602 Shortness of breath: Secondary | ICD-10-CM | POA: Diagnosis not present

## 2017-05-15 DIAGNOSIS — E875 Hyperkalemia: Secondary | ICD-10-CM | POA: Diagnosis not present

## 2017-05-15 DIAGNOSIS — Z9115 Patient's noncompliance with renal dialysis: Secondary | ICD-10-CM | POA: Diagnosis not present

## 2017-05-15 DIAGNOSIS — N186 End stage renal disease: Secondary | ICD-10-CM | POA: Diagnosis not present

## 2017-05-15 DIAGNOSIS — J9601 Acute respiratory failure with hypoxia: Secondary | ICD-10-CM | POA: Diagnosis not present

## 2017-05-15 LAB — BASIC METABOLIC PANEL
Anion gap: 13 (ref 5–15)
BUN: 52 mg/dL — AB (ref 6–20)
CHLORIDE: 97 mmol/L — AB (ref 101–111)
CO2: 27 mmol/L (ref 22–32)
Calcium: 8 mg/dL — ABNORMAL LOW (ref 8.9–10.3)
Creatinine, Ser: 3.5 mg/dL — ABNORMAL HIGH (ref 0.44–1.00)
GFR calc Af Amer: 14 mL/min — ABNORMAL LOW (ref >60)
GFR calc non Af Amer: 12 mL/min — ABNORMAL LOW (ref >60)
Glucose, Bld: 150 mg/dL — ABNORMAL HIGH (ref 65–99)
POTASSIUM: 4.6 mmol/L (ref 3.5–5.1)
SODIUM: 137 mmol/L (ref 135–145)

## 2017-05-15 LAB — CBC
HCT: 36.8 % (ref 36.0–46.0)
Hemoglobin: 11.5 g/dL — ABNORMAL LOW (ref 12.0–15.0)
MCH: 26.9 pg (ref 26.0–34.0)
MCHC: 31.3 g/dL (ref 30.0–36.0)
MCV: 86.2 fL (ref 78.0–100.0)
Platelets: 273 10*3/uL (ref 150–400)
RBC: 4.27 MIL/uL (ref 3.87–5.11)
RDW: 20.5 % — AB (ref 11.5–15.5)
WBC: 5.3 10*3/uL (ref 4.0–10.5)

## 2017-05-15 LAB — GLUCOSE, CAPILLARY
GLUCOSE-CAPILLARY: 187 mg/dL — AB (ref 65–99)
GLUCOSE-CAPILLARY: 196 mg/dL — AB (ref 65–99)
Glucose-Capillary: 197 mg/dL — ABNORMAL HIGH (ref 65–99)

## 2017-05-15 MED ORDER — INSULIN ASPART 100 UNIT/ML ~~LOC~~ SOLN
0.0000 [IU] | Freq: Three times a day (TID) | SUBCUTANEOUS | Status: DC
  Administered 2017-05-15 (×2): 3 [IU] via SUBCUTANEOUS
  Administered 2017-05-16: 5 [IU] via SUBCUTANEOUS
  Administered 2017-05-16 – 2017-05-17 (×2): 3 [IU] via SUBCUTANEOUS
  Administered 2017-05-17: 2 [IU] via SUBCUTANEOUS

## 2017-05-15 MED ORDER — DOXERCALCIFEROL 4 MCG/2ML IV SOLN
INTRAVENOUS | Status: AC
  Filled 2017-05-15: qty 2

## 2017-05-15 MED ORDER — INSULIN ASPART 100 UNIT/ML ~~LOC~~ SOLN
0.0000 [IU] | Freq: Every day | SUBCUTANEOUS | Status: DC
  Administered 2017-05-16: 2 [IU] via SUBCUTANEOUS

## 2017-05-15 NOTE — Progress Notes (Addendum)
[facility] Progress Note  Subjective: occ cough, SOB much better, abd symptoms better as well.    Vitals:   05/15/17 0400 05/15/17 0423 05/15/17 0500 05/15/17 0600  BP: (!) 126/55  (!) 142/80 (!) 142/65  Pulse: 89  91 91  Resp: 16  19 14   Temp:  98.7 F (37.1 C)    TempSrc:  Oral    SpO2: 100%  100% 97%  Weight:      Height:        Inpatient medications: . calcium acetate  667 mg Oral TID WC  . doxercalciferol  2 mcg Intravenous Q T,Th,Sa-HD  . feeding supplement (NEPRO CARB STEADY)  237 mL Oral TID AC  . heparin  5,000 Units Subcutaneous Q8H  . insulin aspart  0-9 Units Subcutaneous TID WC  . mouth rinse  15 mL Mouth Rinse BID  . multivitamin  1 tablet Oral QHS   . ceFEPime (MAXIPIME) IV Stopped (05/14/17 2233)   acetaminophen **OR** acetaminophen, ondansetron **OR** ondansetron (ZOFRAN) IV  Exam: Thin hispanic female, nasal o2, nad No jvd Chest clear bilat to bases RRR Abd soft ntnd Ext no edema LUA AVF NF, ox 3  Dialysis: SW TTS 4h  40kg  Hep 1400  AVf     Impression: 1  ESRD vol^/ pulm edema/ ^K+ - due to missed HD, better today, needs more vol off 2  HTN 3  DM 4  Hx CVA 5  ?HCAP/ fever   Plan - HD today, UF to dry wt, wean O2; HD here tomorrow if still here  Vinson Moselleob Kaylla Cobos MD [facility] pager [phone]   05/15/2017, 9:02 AM   Recent Labs  Lab 05/14/17 0200 05/14/17 0630 05/15/17 0302  NA 139 140 137  K 6.6* 5.8* 4.6  CL 105 106 97*  CO2 18* 18* 27  GLUCOSE 135* 92 150*  BUN 142* 125* 52*  CREATININE 6.74* 6.72* 3.50*  CALCIUM 7.6* 7.7* 8.0*  PHOS  --  6.4*  --    Recent Labs  Lab 05/14/17 0630  ALBUMIN 2.7*   Recent Labs  Lab 05/13/17 2018 05/14/17 0210 05/14/17 0630 05/15/17 0302  WBC 9.1 7.7 6.6 5.3  NEUTROABS 7.0 6.2  --   --   HGB 12.6 11.6* 11.0* 11.5*  HCT 40.2 36.7 35.6* 36.8  MCV 87.8 87.0 86.8 86.2  PLT 301 262 286 273   Iron/TIBC/Ferritin/ %Sat No results found for: IRON, TIBC,  FERRITIN, IRONPCTSAT

## 2017-05-15 NOTE — Progress Notes (Signed)
Admission note:  Arrival Method: Patient arrived from 69M accompanied by the staff in bed. Mental Orientation:  Alert and oriented x 4, spanish speaking. Telemetry: 35M-04, CCMD notified. Assessment: See doc flow sheets. Skin: warm, dry and intact. IV: Right forearm, right AC saline lock. Pain: Denies any pain. Tubes: N/A Safety Measures: Bed in low position, call bell and phone with in reach. Admission Screening: IN process. 6700 Orientation: Patient has been oriented to the unit, staff and to the room.

## 2017-05-15 NOTE — Care Management Note (Addendum)
Case Management Note  Patient Details  Name: Frances Reeves MRN: [id number] Date of Birth: 06/01/1950  Subjective/Objective:   From home alone, missed HD, presents with hyperkalemia, acute resp failure, anemia of CKD, secondary hyperparathyroidism, htn, dm. Patient speaks Spanish, by speaking with intrepreter,  Patient has a PCP but she does not know his name and she states she will be seeing another MD at the same office.  She uses a cane at home.  She has had HHRN and HHPT with [facility] and would like to have them again for RN and PT.  [facility]  She has dialysis on Tue, Thur and Sat and would like for them to come out on the days she does not have Dialysis.  Will get pt to see patient also.  She states her sister can stay with her at home, and the [facility] that picks her up for dialysis helps her to get into the [place].  She will need a youth rolling walker .                         Action/Plan: NCM will follow for dc needs.  Expected Discharge Date:                  Expected Discharge Plan:  Home w Home Health Services  In-House Referral:     Discharge planning Services  CM Consult  Post Acute Care Choice:  Home Health Choice offered to:  Patient  DME Arranged:    DME Agency:     HH Arranged:  RN, PT HH Agency:  [facility]  Status of Service:  Completed, signed off  If discussed at [employer] Meetings, dates discussed:    Additional Comments:  Leone Havenaylor, Shaylie Eklund [place], RN 05/15/2017, 2:26 PM

## 2017-05-15 NOTE — Progress Notes (Addendum)
PROGRESS NOTE  Frances Reeves ZOX:[id number] DOB: [birth year] DOA: 05/13/2017 PCP: System, Pcp Not In  HPI/Recap of past 57 hours: 67 year old female with past medical history of end-stage renal disease on hemodialysis (TTS), CVA and diabetes mellitus is also had a previous history of noncompliance with dialysis presented to the emergency room on the evening of 1/23 with complaints of nausea, vomiting and diarrhea plus cough and shortness of breath. Patient missed her previous dialysis session on 1/22 and states that she last had dialysis on 1/19, but that was an incomplete session. Found to be hypoxic with oxygen saturation on 80%.  Pt hyperkalemic with potassium of 6.6 and EKG with peaked T waves.  Given Kayexalate, insulin and D50. Patient requiring BiPAP to keep oxygen saturations greater than 90%. Chest x-ray noted fluid and questionable superimposed pneumonia. Given antibiotics. Patient placed in stepdown and taken for emergent dialysis this morning.  Following dialysis, respiratory status much improved. Able to be weaned from BiPAP and oxygen and by morning of 1/25, able to be off of oxygen altogether. Patient back in dialysis again today, tired feeling a little bit better. Some cough, but breathing a lot easier.  Assessment/Plan: Principal Problem:   Acute respiratory failure with hypoxia (HCC) secondary to volume overload, noncompliance with dialysis: Responded well to dialysis.  Able to be weaned off of oxygen. Transfer to renal floor. Continue antibiotic. Procalcitonin level elevated at 4.66 consistent with pneumonia/infection. (End-stage renal disease not contraindication for pro-calcitonin level reliability).  Will repeat level in the morning & if improved, could discharge home on by mouth Levaquin Active Problems:   DM (diabetes mellitus) (HCC): Sliding scale, holding Amaryl.  CBG's slightly higher.  Will increase sliding scale coverage.    ESRD (end stage renal disease) [facility]):  Continue dialysis. HD tomorrow which will put her back on schedule    Hyperkalemia: finally normalized.    Noncompliance of patient with renal dialysis [facility]): We'll have extensive discussion with patient.    Pulmonary edema  Hypertension: Elevated blood pressure secondary to volume overload. Restarting home meds  Code Status: Full code  Family Communication: Left message for family  Disposition Plan: Potential discharge tomorrow   Consultants:  Nephrology   Procedures:  Emergent dialysis 1/24   Antimicrobials: IV cefepime and vancomycin 1/24-present  DVT prophylaxis:  subcutaneous heparin   Objective: Vitals:   05/15/17 0930 05/15/17 1000 05/15/17 1015 05/15/17 1030  BP: (!) 106/55 (!) 89/53 (!) 119/57 (!) 115/57  Pulse: 92 91 91 88  Resp:      Temp:      TempSrc:      SpO2:      Weight:      Height:        Intake/Output Summary (Last 24 hours) at 05/15/2017 1127 Last data filed at 05/14/2017 2233 Gross per 24 hour  Intake 170 ml  Output -  Net 170 ml   Filed Weights   05/14/17 0635 05/14/17 1047 05/15/17 0715  Weight: 46 kg (101 lb 6.6 oz) 43.7 kg (96 lb 5.5 oz) 43.3 kg (95 lb 7.4 oz)    Exam:   General:  More awake, but still a little somnolent  HEENT: Normocephalic, atraumatic, mucous membranes are slightly dry  Neck: Supple, no JVD  Cardiovascular: regular rate and rhythm, S1 and S2   Respiratory: decreased breath sounds bibasilar, no wheezing   Abdomen: soft, nondistended, nontender, hypoactive bowel sounds   Musculoskeletal: no clubbing or cyanosis, 1+ pitting edema from the knees down  Skin: no skin breaks, tears or lesions  Psychiatry:  appropriate, no evidence of psychoses    Data Reviewed: CBC: Recent Labs  Lab 05/13/17 2018 05/14/17 0210 05/14/17 0630 05/15/17 0302  WBC 9.1 7.7 6.6 5.3  NEUTROABS 7.0 6.2  --   --   HGB 12.6 11.6* 11.0* 11.5*  HCT 40.2 36.7 35.6* 36.8  MCV 87.8 87.0 86.8 86.2  PLT 301 262 286 273    Basic Metabolic Panel: Recent Labs  Lab 05/13/17 2018 05/14/17 0200 05/14/17 0630 05/15/17 0302  NA 140 139 140 137  K 6.5* 6.6* 5.8* 4.6  CL 103 105 106 97*  CO2 20* 18* 18* 27  GLUCOSE 233* 135* 92 150*  BUN 118* 142* 125* 52*  CREATININE 7.44* 6.74* 6.72* 3.50*  CALCIUM 7.6* 7.6* 7.7* 8.0*  PHOS  --   --  6.4*  --    GFR: Estimated Creatinine Clearance: 9.9 mL/min (A) (by C-G formula based on SCr of 3.5 mg/dL (H)). Liver Function Tests: Recent Labs  Lab 05/14/17 0630  ALBUMIN 2.7*   No results for input(s): LIPASE, AMYLASE in the last 168 hours. No results for input(s): AMMONIA in the last 168 hours. Coagulation Profile: No results for input(s): INR, PROTIME in the last 168 hours. Cardiac Enzymes: No results for input(s): CKTOTAL, CKMB, CKMBINDEX, TROPONINI in the last 168 hours. BNP (last 3 results) No results for input(s): PROBNP in the last 8760 hours. HbA1C: No results for input(s): HGBA1C in the last 72 hours. CBG: Recent Labs  Lab 05/14/17 0528 05/14/17 1135 05/14/17 1552 05/14/17 2104  GLUCAP 156* 95 192* 212*   Lipid Profile: No results for input(s): CHOL, HDL, LDLCALC, TRIG, CHOLHDL, LDLDIRECT in the last 72 hours. Thyroid Function Tests: No results for input(s): TSH, T4TOTAL, FREET4, T3FREE, THYROIDAB in the last 72 hours. Anemia Panel: No results for input(s): VITAMINB12, FOLATE, FERRITIN, TIBC, IRON, RETICCTPCT in the last 72 hours. Urine analysis:    Component Value Date/Time   COLORURINE STRAW (A) 07/08/2016 1148   APPEARANCEUR CLEAR 07/08/2016 1148   LABSPEC 1.020 07/08/2016 1148   PHURINE 6.0 07/08/2016 1148   GLUCOSEU >=500 (A) 07/08/2016 1148   HGBUR NEGATIVE 07/08/2016 1148   BILIRUBINUR NEGATIVE 07/08/2016 1148   KETONESUR NEGATIVE 07/08/2016 1148   PROTEINUR >=300 (A) 07/08/2016 1148   UROBILINOGEN 0.2 02/20/2015 2223   NITRITE NEGATIVE 07/08/2016 1148   LEUKOCYTESUR NEGATIVE 07/08/2016 1148   Sepsis  Labs: @LABRCNTIP (procalcitonin:4,lacticidven:4)  ) Recent Results (from the past 240 hour(s))  MRSA PCR Screening     Status: None   Collection Time: 05/14/17  3:35 AM  Result Value Ref Range Status   MRSA by PCR NEGATIVE NEGATIVE Final    Comment:        The GeneXpert MRSA Assay (FDA approved for NASAL specimens only), is one component of a comprehensive MRSA colonization surveillance program. It is not intended to diagnose MRSA infection nor to guide or monitor treatment for MRSA infections.       Studies: No results found.  Scheduled Meds: . doxercalciferol      . calcium acetate  667 mg Oral TID WC  . doxercalciferol  2 mcg Intravenous Q T,Th,Sa-HD  . feeding supplement (NEPRO CARB STEADY)  237 mL Oral TID AC  . heparin  5,000 Units Subcutaneous Q8H  . insulin aspart  0-9 Units Subcutaneous TID WC  . mouth rinse  15 mL Mouth Rinse BID  . multivitamin  1 tablet Oral QHS    Continuous Infusions: .  ceFEPime (MAXIPIME) IV Stopped (05/14/17 2233)     LOS: 2 days     Hollice Espy, MD [facility]  To reach me or the doctor on call, go to: [email or web address] Password [facility]  05/15/2017, 11:27 AM

## 2017-05-15 NOTE — Evaluation (Signed)
Physical Therapy Evaluation Patient Details Name: Frances Reeves MRN: [id number] DOB: 07/02/1950 Today's Date: 05/15/2017   History of Present Illness  67 year old female with past medical history of end-stage renal disease on hemodialysis (TTS), CVA and diabetes mellitus, h/o noncompliance with dialysis, admitted with nausea, vomiting and diarrhea plus cough and shortness of breath. Patient missed her previous dialysis session on 1/22 and had incomplete session on 1/19. Found to be hypoxic and hyperkalemic.  Clinical Impression  Patient presents with decreased independence and safety with mobility.  Currently very high risk for falls as needed mod A to prevent falling while ambulating with RW.  At home was independent with cane.  Feel if she truly has 24/7 capable assist (which she says she does with her sister) can go home with walker and HHPT; otherwise would need SNF level care.    Follow Up Recommendations Home health PT;Supervision/Assistance - 24 hour    Equipment Recommendations  Rolling walker with 5" wheels(youth RW)    Recommendations for Other Services       Precautions / Restrictions Precautions Precautions: Fall Precaution Comments: watch O2; h/o fall at home with skinned knee      Mobility  Bed Mobility Overal bed mobility: Modified Independent                Transfers Overall transfer level: Needs assistance Equipment used: Straight cane Transfers: Sit to/from Stand Sit to Stand: Min assist;From elevated surface         General transfer comment: from high bed needed lifting help and steadying help  Ambulation/Gait Ambulation/Gait assistance: Min assist;Mod assist Ambulation Distance (Feet): 80 Feet Assistive device: Rolling walker (2 wheeled) Gait Pattern/deviations: Step-through pattern;Step-to pattern;Decreased stride length;Shuffle;Scissoring;Staggering left     General Gait Details: 3 LOB to L with walker and mod A to recover, max cues  for walker safety with proximity and assist to turn walker  Stairs            Wheelchair Mobility    Modified Rankin (Stroke Patients Only)       Balance Overall balance assessment: Needs assistance Sitting-balance support: Feet unsupported;No upper extremity supported Sitting balance-Leahy Scale: Good     Standing balance support: Bilateral upper extremity supported Standing balance-Leahy Scale: Poor Standing balance comment: UE support needed for balance (attempted initially with cane, but too unsteady)                             Pertinent Vitals/Pain Pain Assessment: No/denies pain    Home Living Family/patient expects to be discharged to:: Private residence Living Arrangements: Alone Available Help at Discharge: Family;Available 24 hours/day Type of Home: Apartment Home Access: Level entry     Home Layout: One level Home Equipment: Shower seat;Hand held shower head;Cane - single point;Walker - standard      Prior Function Level of Independence: Independent with assistive device(s)               Hand Dominance        Extremity/Trunk Assessment   Upper Extremity Assessment Upper Extremity Assessment: Generalized weakness    Lower Extremity Assessment Lower Extremity Assessment: Generalized weakness       Communication   Communication: Prefers language other than English(Rosa Stratus interpreter # [id number])  Cognition Arousal/Alertness: Awake/alert Behavior During Therapy: WFL for tasks assessed/performed Overall Cognitive Status: No family/caregiver present to determine baseline cognitive functioning  General Comments: problem solving deficits, seems not alarmed enough over her mobility issues      General Comments General comments (skin integrity, edema, etc.): through interpreter noted her sister can stay with her 24/7 and that dialysis transportation picks her up at the door and  assists her to the [facility]    Exercises     Assessment/Plan    PT Assessment Patient needs continued PT services  PT Problem List Decreased strength;Decreased mobility;Decreased safety awareness;Decreased knowledge of precautions;Decreased activity tolerance;Decreased balance;Decreased knowledge of use of DME       PT Treatment Interventions DME instruction;Functional mobility training;Balance training;Patient/family education;Gait training;Therapeutic activities;Therapeutic exercise    PT Goals (Current goals can be found in the Care Plan section)  Acute Rehab PT Goals Patient Stated Goal: To go home PT Goal Formulation: With patient Time For Goal Achievement: 05/22/17 Potential to Achieve Goals: Good    Frequency Min 3X/week   Barriers to discharge        Co-evaluation               AM-PAC PT "6 Clicks" Daily Activity  Outcome Measure Difficulty turning over in bed (including adjusting bedclothes, sheets and blankets)?: None Difficulty moving from lying on back to sitting on the side of the bed? : None Difficulty sitting down on and standing up from a chair with arms (e.g., wheelchair, bedside commode, etc,.)?: Unable Help needed moving to and from a bed to chair (including a wheelchair)?: A Little Help needed walking in hospital room?: A Lot Help needed climbing 3-5 steps with a railing? : A Lot 6 Click Score: 16    End of Session Equipment Utilized During Treatment: Gait belt;Oxygen Activity Tolerance: Patient limited by fatigue Patient left: in bed;with bed alarm set Nurse Communication: Mobility status PT Visit Diagnosis: Muscle weakness (generalized) (M62.81);History of falling (Z91.81);Other abnormalities of gait and mobility (R26.89)    Time: 4098-1191 PT Time Calculation (min) (ACUTE ONLY): 24 min   Charges:   PT Evaluation $PT Eval Moderate Complexity: 1 Mod PT Treatments $Gait Training: 8-22 mins   PT G CodesSheran Reeves,  Frances Reeves [phone] 05/15/2017   Frances Reeves 05/15/2017, 4:34 PM

## 2017-05-16 DIAGNOSIS — J9601 Acute respiratory failure with hypoxia: Secondary | ICD-10-CM | POA: Diagnosis not present

## 2017-05-16 LAB — GLUCOSE, CAPILLARY
GLUCOSE-CAPILLARY: 244 mg/dL — AB (ref 65–99)
Glucose-Capillary: 190 mg/dL — ABNORMAL HIGH (ref 65–99)
Glucose-Capillary: 228 mg/dL — ABNORMAL HIGH (ref 65–99)

## 2017-05-16 LAB — PROCALCITONIN: PROCALCITONIN: 5.6 ng/mL

## 2017-05-16 MED ORDER — HEPARIN SODIUM (PORCINE) 1000 UNIT/ML DIALYSIS: 1400.0000 [IU] | Freq: Once | INTRAMUSCULAR | Status: DC

## 2017-05-16 MED ORDER — PNEUMOCOCCAL VAC POLYVALENT 25 MCG/0.5ML IJ INJ
0.5000 mL | INJECTION | INTRAMUSCULAR | Status: AC
  Administered 2017-05-17: 0.5 mL via INTRAMUSCULAR
  Filled 2017-05-16: qty 0.5

## 2017-05-16 MED ORDER — AZITHROMYCIN 500 MG PO TABS
500.0000 mg | ORAL_TABLET | Freq: Every day | ORAL | Status: DC
  Administered 2017-05-16 – 2017-05-17 (×2): 500 mg via ORAL
  Filled 2017-05-16 (×2): qty 1

## 2017-05-16 MED ORDER — DOXERCALCIFEROL 4 MCG/2ML IV SOLN
INTRAVENOUS | Status: AC
  Filled 2017-05-16: qty 2

## 2017-05-16 MED ORDER — PNEUMOCOCCAL VAC POLYVALENT 25 MCG/0.5ML IJ INJ
0.5000 mL | INJECTION | INTRAMUSCULAR | Status: DC
  Filled 2017-05-16: qty 0.5

## 2017-05-16 NOTE — Progress Notes (Signed)
PT Cancellation Note  Patient Details Name: Frances Reeves MRN: [id number] DOB: 07/24/1950   Cancelled Treatment:    Reason Eval/Treat Not Completed: Other (comment).  Declined through translation stating she had just walked.   Ivar DrapeRuth E Tawnee Clegg 05/16/2017, 12:54 PM   Samul Dadauth Elliott Lasecki, PT MS Acute Rehab Dept. Number: [facility] [phone] and [facility] [phone]

## 2017-05-16 NOTE — Progress Notes (Signed)
Patient was very unsteady when ambulated to the bathroom even with the use of front wheel walker.She was also complaining of slight dizziness and very weak,noticed of having a hard time of using her walker.She said that her legs feels like a water.

## 2017-05-16 NOTE — Progress Notes (Signed)
Pharmacy Antibiotic Note  Frances Reeves is a 67 y.o. female admitted on 05/13/2017 with pneumonia. She presented to the ED with SOB. Pharmacy has been consulted for Cefepime. Patient has a history of CKD on T/T/S HD.  Procal 4.66>>5.6, afeb, WBC wnl, RR wnl.  Currently in HD.    Plan: Continue cefepime 1g IV q 24h Monitor clinical picture, renal function F/U C&S, abx deescalation / LOT  Height: 4\' 10"  (147.3 cm) Weight: 91 lb 7.9 oz (41.5 kg) IBW/kg (Calculated) : 40.9  Temp (24hrs), Avg:98.5 F (36.9 C), Min:97.7 F (36.5 C), Max:99.2 F (37.3 C)  Recent Labs  Lab 05/13/17 2018 05/14/17 0200 05/14/17 0210 05/14/17 0630 05/15/17 0302  WBC 9.1  --  7.7 6.6 5.3  CREATININE 7.44* 6.74*  --  6.72* 3.50*    Estimated Creatinine Clearance: 9.9 mL/min (A) (by C-G formula based on SCr of 3.5 mg/dL (H)).    No Known Allergies  Antimicrobials this admission: Vanc 1/23 >> 1/25 Cefepime 1/23 >>   Dose adjustments this admission: None   Microbiology results: MRSA PCR neg  Daylene PoseyJonathan Balbina Depace, PharmD Pharmacy Resident Pager #: [phone] 05/16/2017 10:21 AM

## 2017-05-16 NOTE — Progress Notes (Signed)
PROGRESS NOTE    JOVEE DETTINGER  ZOX:[id number] DOB: January 09, 1951 DOA: 05/13/2017 PCP: System, Pcp Not In   Brief Narrative:  Mrs. Grindle is a 67 year old woman with a past medical history relevant for end-stage renal disease on hemodialysis Tuesday Thursday Saturday, stroke, type 2 diabetes, and history of noncompliance who was admitted on 05/13/2017 with shortness of breath nausea vomiting diarrhea and cough and found to have missed dialysis.  Patient was hyperkalemic and noted to be symptomatic and required BiPAP to keep oxygen saturations up.  Patient improved after dialysis and weaning of BiPAP.  Patient is being treated for possible hospital versus committee acquired pneumonia.   Assessment & Plan:   Principal Problem:   Acute respiratory failure with hypoxia (HCC) Active Problems:   DM (diabetes mellitus) (HCC)   ESRD (end stage renal disease) (HCC)   Hyperkalemia, diminished renal excretion   Noncompliance of patient with renal dialysis (HCC)   Pulmonary edema   ##) Acute hypoxic respiratory failure: Differential diagnosis includes noncompliance with dialysis and pulmonary edema, pneumonia/infection.  Patient's elevated pro-calcitonin suggests that infection is at least playing a role at this time. -Repeat pro-calcitonin continues to increase we will continue cefepime at this time will also add azithromycin -No blood cultures flu swab was obtained  ##) ESRD TTS:  -Nephrology consulted appreciate recommendations  ##) Prior CVA: -Stable  ##) T2DM: Stable with meals -Sliding scale insulin here  FEN: - fluids: restrict - electrolytes: monitor, dialysis - nutrition: renal diet  Prophy: SQ heparin  Dispo: pending downtrending procalcitonin  Consultants:   Nephrology  Procedures: (Don't include imaging studies which can be auto populated. Include things that cannot be auto populated i.e. Echo, Carotid and venous dopplers, Foley, Bipap, HD, tubes/drains,  wound vac, central lines etc)  dialysis  Antimicrobials: (specify start and planned stop date. Auto populated tables are space occupying and do not give end dates)  Cefepime 05/13/17  Vancomycin 05/13/2017 to 05/15/2017  Azithromycin 05/16/17    Subjective: Patient reports that her breathing is improved from prior.  Is still not back to baseline.  Objective: Vitals:   05/16/17 0930 05/16/17 1000 05/16/17 1030 05/16/17 1218  BP: 130/80 120/84 (!) 123/56 (!) 130/51  Pulse: 84 84 80 87  Resp: 18 18 18 18   Temp:   98 F (36.7 C) 99 F (37.2 C)  TempSrc:   Oral Oral  SpO2:   100% 95%  Weight:   40.4 kg (89 lb 1.1 oz)   Height:        Intake/Output Summary (Last 24 hours) at 05/16/2017 1456 Last data filed at 05/16/2017 1030 Gross per 24 hour  Intake 120 ml  Output 1000 ml  Net -880 ml   Filed Weights   05/15/17 2105 05/16/17 0727 05/16/17 1030  Weight: 40.8 kg (89 lb 15.2 oz) 41.5 kg (91 lb 7.9 oz) 40.4 kg (89 lb 1.1 oz)    Examination:  General exam: Appears calm and comfortable Respiratory system: Diminished breath sounds at bases, mild crackles  cardiovascular system: Regular rate and rhythm radiating murmur Gastrointestinal system: Abdomen is nondistended, soft and nontender.  Central nervous system: Alert and oriented. No focal neurological deficits. Extremities: 1+ lower examinee edema Skin: No rashes on visible skin Psychiatry: Judgement and insight appear normal.     Data Reviewed: I have personally reviewed following labs and imaging studies  CBC: Recent Labs  Lab 05/13/17 2018 05/14/17 0210 05/14/17 0630 05/15/17 0302  WBC 9.1 7.7 6.6 5.3  NEUTROABS 7.0 6.2  --   --  HGB 12.6 11.6* 11.0* 11.5*  HCT 40.2 36.7 35.6* 36.8  MCV 87.8 87.0 86.8 86.2  PLT 301 262 286 273   Basic Metabolic Panel: Recent Labs  Lab 05/13/17 2018 05/14/17 0200 05/14/17 0630 05/15/17 0302  NA 140 139 140 137  K 6.5* 6.6* 5.8* 4.6  CL 103 105 106 97*  CO2 20* 18*  18* 27  GLUCOSE 233* 135* 92 150*  BUN 118* 142* 125* 52*  CREATININE 7.44* 6.74* 6.72* 3.50*  CALCIUM 7.6* 7.6* 7.7* 8.0*  PHOS  --   --  6.4*  --    GFR: Estimated Creatinine Clearance: 9.8 mL/min (A) (by C-G formula based on SCr of 3.5 mg/dL (H)). Liver Function Tests: Recent Labs  Lab 05/14/17 0630  ALBUMIN 2.7*   No results for input(s): LIPASE, AMYLASE in the last 168 hours. No results for input(s): AMMONIA in the last 168 hours. Coagulation Profile: No results for input(s): INR, PROTIME in the last 168 hours. Cardiac Enzymes: No results for input(s): CKTOTAL, CKMB, CKMBINDEX, TROPONINI in the last 168 hours. BNP (last 3 results) No results for input(s): PROBNP in the last 8760 hours. HbA1C: No results for input(s): HGBA1C in the last 72 hours. CBG: Recent Labs  Lab 05/14/17 2104 05/15/17 1146 05/15/17 1612 05/15/17 2103 05/16/17 1217  GLUCAP 212* 187* 196* 197* 244*   Lipid Profile: No results for input(s): CHOL, HDL, LDLCALC, TRIG, CHOLHDL, LDLDIRECT in the last 72 hours. Thyroid Function Tests: No results for input(s): TSH, T4TOTAL, FREET4, T3FREE, THYROIDAB in the last 72 hours. Anemia Panel: No results for input(s): VITAMINB12, FOLATE, FERRITIN, TIBC, IRON, RETICCTPCT in the last 72 hours. Sepsis Labs: Recent Labs  Lab 05/14/17 1652 05/16/17 0458  PROCALCITON 4.66 5.60    Recent Results (from the past 240 hour(s))  MRSA PCR Screening     Status: None   Collection Time: 05/14/17  3:35 AM  Result Value Ref Range Status   MRSA by PCR NEGATIVE NEGATIVE Final    Comment:        The GeneXpert MRSA Assay (FDA approved for NASAL specimens only), is one component of a comprehensive MRSA colonization surveillance program. It is not intended to diagnose MRSA infection nor to guide or monitor treatment for MRSA infections.          Radiology Studies: No results found.      Scheduled Meds: . calcium acetate  667 mg Oral TID WC  .  doxercalciferol  2 mcg Intravenous Q T,Th,Sa-HD  . feeding supplement (NEPRO CARB STEADY)  237 mL Oral TID AC  . [START ON 05/17/2017] heparin  1,400 Units Dialysis Once in dialysis  . heparin  5,000 Units Subcutaneous Q8H  . insulin aspart  0-15 Units Subcutaneous TID WC  . insulin aspart  0-5 Units Subcutaneous QHS  . mouth rinse  15 mL Mouth Rinse BID  . multivitamin  1 tablet Oral QHS   Continuous Infusions: . ceFEPime (MAXIPIME) IV Stopped (05/16/17 0619)     LOS: 3 days    Time spent: 30    Delaine LameShrey C Elman Dettman, MD [facility] Pager [phone]  If 7PM-7AM, please contact night-coverage [email or web address] Password [facility] 05/16/2017, 2:56 PM

## 2017-05-16 NOTE — Progress Notes (Signed)
PT Cancellation Note  Patient Details Name: Frances Reeves MRN: [id number] DOB: 04/14/1951   Cancelled Treatment:    Reason Eval/Treat Not Completed: Other (comment);Patient at procedure or test/unavailable.  Pt was in HD and will try later as time and pt allow.   Ivar DrapeRuth E Deaglan Lile 05/16/2017, 10:58 AM   Samul Dadauth Muhsin Doris, PT MS Acute Rehab Dept. Number: [facility] [id number] and [facility] [phone]

## 2017-05-16 NOTE — Progress Notes (Signed)
[facility] Progress Note  Subjective: occ cough, SOB better, L sided CP  Vitals:   05/16/17 0930 05/16/17 1000 05/16/17 1030 05/16/17 1218  BP: 130/80 120/84 (!) 123/56 (!) 130/51  Pulse: 84 84 80 87  Resp: 18 18 18 18   Temp:   98 F (36.7 C) 99 F (37.2 C)  TempSrc:   Oral Oral  SpO2:   100% 95%  Weight:   40.4 kg (89 lb 1.1 oz)   Height:        Inpatient medications: . calcium acetate  667 mg Oral TID WC  . doxercalciferol  2 mcg Intravenous Q T,Th,Sa-HD  . feeding supplement (NEPRO CARB STEADY)  237 mL Oral TID AC  . [START ON 05/17/2017] heparin  1,400 Units Dialysis Once in dialysis  . heparin  5,000 Units Subcutaneous Q8H  . insulin aspart  0-15 Units Subcutaneous TID WC  . insulin aspart  0-5 Units Subcutaneous QHS  . mouth rinse  15 mL Mouth Rinse BID  . multivitamin  1 tablet Oral QHS   . ceFEPime (MAXIPIME) IV Stopped (05/16/17 [id number])   acetaminophen **OR** acetaminophen, ondansetron **OR** ondansetron (ZOFRAN) IV  Exam: Thin hispanic female, nasal o2, nad No jvd Chest clear bilat to bases RRR Abd soft ntnd Ext no edema LUA AVF NF, ox 3  Dialysis: SW TTS 4h  40kg  Hep 1400  AVf     Impression: 1  ESRD vol^/ pulm edema/ ^K+ - due to missed HD, all resolved, down to dry wt now 2  HTN 3  DM 4  Hx CVA 5  ?HCAP/ fever   Plan - HD today, ok for dc from renal standpoint  Vinson Moselleob Abbott Jasinski MD [facility] pager [phone]   05/16/2017, 12:54 PM   Recent Labs  Lab 05/14/17 0200 05/14/17 0630 05/15/17 0302  NA 139 140 137  K 6.6* 5.8* 4.6  CL 105 106 97*  CO2 18* 18* 27  GLUCOSE 135* 92 150*  BUN 142* 125* 52*  CREATININE 6.74* 6.72* 3.50*  CALCIUM 7.6* 7.7* 8.0*  PHOS  --  6.4*  --    Recent Labs  Lab 05/14/17 0630  ALBUMIN 2.7*   Recent Labs  Lab 05/13/17 2018 05/14/17 0210 05/14/17 0630 05/15/17 0302  WBC 9.1 7.7 6.6 5.3  NEUTROABS 7.0 6.2  --   --   HGB 12.6 11.6* 11.0* 11.5*  HCT 40.2 36.7 35.6*  36.8  MCV 87.8 87.0 86.8 86.2  PLT 301 262 286 273   Iron/TIBC/Ferritin/ %Sat No results found for: IRON, TIBC, FERRITIN, IRONPCTSAT

## 2017-05-17 DIAGNOSIS — J9601 Acute respiratory failure with hypoxia: Secondary | ICD-10-CM | POA: Diagnosis not present

## 2017-05-17 DIAGNOSIS — Z23 Encounter for immunization: Secondary | ICD-10-CM | POA: Diagnosis not present

## 2017-05-17 DIAGNOSIS — R0602 Shortness of breath: Secondary | ICD-10-CM | POA: Diagnosis present

## 2017-05-17 DIAGNOSIS — E8779 Other fluid overload: Secondary | ICD-10-CM

## 2017-05-17 LAB — CBC
HCT: 38.5 % (ref 36.0–46.0)
Hemoglobin: 11.9 g/dL — ABNORMAL LOW (ref 12.0–15.0)
MCH: 26.9 pg (ref 26.0–34.0)
MCHC: 30.9 g/dL (ref 30.0–36.0)
MCV: 87.1 fL (ref 78.0–100.0)
Platelets: 277 K/uL (ref 150–400)
RBC: 4.42 MIL/uL (ref 3.87–5.11)
RDW: 19.7 % — ABNORMAL HIGH (ref 11.5–15.5)
WBC: 5.2 K/uL (ref 4.0–10.5)

## 2017-05-17 LAB — RENAL FUNCTION PANEL
Albumin: 2.7 g/dL — ABNORMAL LOW (ref 3.5–5.0)
Anion gap: 15 (ref 5–15)
BUN: 40 mg/dL — ABNORMAL HIGH (ref 6–20)
CO2: 26 mmol/L (ref 22–32)
Calcium: 8.5 mg/dL — ABNORMAL LOW (ref 8.9–10.3)
Chloride: 95 mmol/L — ABNORMAL LOW (ref 101–111)
Creatinine, Ser: 3.21 mg/dL — ABNORMAL HIGH (ref 0.44–1.00)
GFR calc Af Amer: 16 mL/min — ABNORMAL LOW (ref >60)
GFR calc non Af Amer: 14 mL/min — ABNORMAL LOW (ref >60)
Glucose, Bld: 166 mg/dL — ABNORMAL HIGH (ref 65–99)
Phosphorus: 3.8 mg/dL (ref 2.5–4.6)
Potassium: 4.2 mmol/L (ref 3.5–5.1)
Sodium: 136 mmol/L (ref 135–145)

## 2017-05-17 LAB — GLUCOSE, CAPILLARY
GLUCOSE-CAPILLARY: 187 mg/dL — AB (ref 65–99)
GLUCOSE-CAPILLARY: 196 mg/dL — AB (ref 65–99)

## 2017-05-17 LAB — MAGNESIUM: Magnesium: 2.7 mg/dL — ABNORMAL HIGH (ref 1.7–2.4)

## 2017-05-17 MED ORDER — CEFDINIR 300 MG PO CAPS: 300.0000 mg | ORAL_CAPSULE | Freq: Two times a day (BID) | ORAL | 0 refills | Status: DC

## 2017-05-17 MED ORDER — AZITHROMYCIN 500 MG PO TABS: 500.0000 mg | ORAL_TABLET | Freq: Every day | ORAL | 0 refills | Status: DC

## 2017-05-17 NOTE — Discharge Instructions (Signed)
Dieta para dilisis (Dialysis Diet) La dilisis es el tratamiento que pueden indicarle en caso de que tenga dao renal importante. Reemplaza parte de la funcin que [employer]. Una de las tareas de la dilisis es [employer] desechos, la sal y el agua sobrante de la [place], lo que ayuda a que la cantidad de potasio y otros nutrientes se Dietitian en niveles saludables. Cuando la dilisis es [place], es importante prestar mucha atencin a Psychologist, forensic. Entre una sesin de dilisis y la siguiente, ciertos nutrientes pueden acumularse en la sangre y causarle enfermedades. Las vitaminas y los minerales son Neomia Dear parte importante de una dieta saludable, y no debe suspenderlos por completo. Sin embargo, habitualmente se recomienda que limite el consumo de potasio, fsforo y [place]. Tambin puede ser necesario restringir el consumo de otros nutrientes, por ejemplo, carbohidratos o grasas, si tiene [employer]. El mdico o el nutricionista pueden ayudarlo a Production assistant, radio cantidad de estos nutrientes [place] para su caso. EN QU CONSISTE EL PLAN? El mdico lo ayudar a Holiday representative de alimentacin especfico para sus necesidades. Generalmente, este plan incluye lo siguiente:  Cereales, de 6 a 11porciones diarias. Una porcin equivale a 1rebanada de pan o a taza de arroz o pasta cocidos.  Verduras con bajo contenido de [place], de 2 a 3porciones diarias. Una porcin equivale a taza.  Frutas con bajo contenido de [place], de 2 a 3porciones diarias. Una porcin equivale a taza.  Carnes y otras fuentes de protenas, de 8 a 11onzas (224 a 308gramos) diarios.  Lcteos, taza diaria. El nutricionista le dar indicaciones especficas sobre la cantidad de lquido que puede beber por Futures trader. QU DEBO SABER ACERCA DE LA DIETA PARA DILISIS?  Limite el consumo de potasio. La [place], las frutas y las verduras contienen potasio.  Limite el consumo de fsforo. La [place], el queso, los  frijoles, los frutos secos y las gaseosas contienen fsforo. No consuma alimentos integrales y con [address] de [place], porque contienen grandes cantidades de fsforo.  Limite el consumo de sodio. Entre los alimentos con alto contenido de sodio se incluyen las carnes procesadas y los embutidos, las comidas congeladas preelaboradas, las verduras enlatadas y las Paediatric nurse. No use sustitutos de la sal porque contienen potasio.  Si le indicaron que limite la ingesta de lquidos, siga las instrucciones especficas del mdico. Es posible que le indiquen que: ? Anote lo que bebe y los alimentos que consume que estn elaborados principalmente a base de agua, como gelatinas y sopas. ? Beba en tazas pequeas para ayudar a controlar la cantidad de lquido que toma.  Consulte al mdico si debe tomar habitualmente un medicamento de venta libre fijador del fsforo, como los anticidos que contienen carbonato de calcio.  Tome los suplementos vitamnicos y [employer] como se lo haya indicado el mdico.  Consuma protenas de alta calidad, como carne, aves, pescado y [place]. Limite las protenas vegetales de baja calidad, como los frutos secos y los frijoles.  Corte las papas en trozos pequeos e hirvalas en agua sin sal antes de comerlas. Esto puede ayudar a Journalist, newspaper del potasio que contiene la papa.  Elimine todo el lquido de las verduras cocidas y las frutas enlatadas antes de consumirlas. QU ALIMENTOS PUEDO COMER? Cereales Pan blanco. Arroz blanco. Cereales cocidos. Palomitas de maz sin sal. Tortillas. Pastas. Verduras Brcoli, zanahorias y judas verdes frescas o congeladas. Repollo. Coliflor. Apio. Pepinos. Christella Noa. Rbanos. [place]n. Frutas Manzanas. Frutos rojos frescos o congelados. Peras, duraznos y anan  frescos o enlatados. Uvas. Ciruelas. Carnes y [address]ras fuentes de protenas Carne, cerdo, pollo y pescado frescos o congelados. Huevos. Atn o salmn enlatado con  bajo contenido de sodio. Lcteos Queso crema. Crema espesa. Queso ricota. Bebidas Sidra de [place]ana. Jugo de arndanos. Mosto. Limonada. Caf negro. Condimentos Hierbas. Especias. Mermelada y [place]jalea. Miel. Dulces y VF Corporationpostres Sorbete. Bizcochuelos. Galletas. Grasas y [address] Aceite de [place] Bridgeoliva, canola y Barregirasol. Otros Crema no lctea. Cobertura batida no lctea. Caldos caseros sin sal. Los artculos mencionados arriba pueden no ser Raytheonuna lista completa de las bebidas o los alimentos recomendados. Comunquese con el nutricionista para conocer ms opciones. QU ALIMENTOS NO SE RECOMIENDAN? Cereales Panes integrales. Pastas integrales. Cereales con alto contenido de Wood[place]fibra. Verduras Papas. Remolachas. Tomates. Zapallo y calabaza. Esprragos. Espinaca. Chiriva. Nils PyleFrutas Carambola. Bananas. Naranjas. Kiwi. Pelones. Ciruelas pasas. Meln. Frutas desecadas. [place]. Carnes y otras fuentes de protenas Carnes enlatadas y Brenhamahumadas, y embutidos. Fiambres envasados. Sardinas. Frutos secos y semillas. Mantequilla de man. Frijoles y legumbres. Lcteos [place]he. Suero de [place]. Yogur. Queso y [employer]gett [employer] Plattqueso cottage. Quesos untables procesados. Bebidas Jugo de naranjas. Jugo de ciruelas. Bebidas gaseosas. Condimentos Sal. Sustitutos de la sal. Salsa de soja. Dulces y TXU Corppostres Helados. Chocolate. Pralin de frutos secos. Grasas y Barnes & Nobleaceites Mantequilla. Margarina. Otros Comidas congeladas preelaboradas. Sopas enlatadas. Los artculos mencionados arriba pueden no ser Raytheonuna lista completa de las bebidas y los alimentos que se Theatre stage managerdeben evitar. Comunquese con el nutricionista para obtener ms informacin. Esta informacin no tiene Theme park managercomo fin reemplazar el consejo del mdico. Asegrese de hacerle al mdico cualquier pregunta que tenga. Document Released: 10/07/2011 Document Revised: 04/28/2014 Document Reviewed: 11/08/2013 Elsevier Interactive Patient Education  [employer]2018 Elsevier Inc.

## 2017-05-17 NOTE — Progress Notes (Signed)
[facility] Progress Note   Dialysis Orders: SW TTS 2 K 2.25 Ca 350/600 4h  40kg  Hep 1400  AVf hectorol 2 venofer 50 /week     Assessment/Plan: 1. ESRD -TTSwith hyperkalemia resolved - K 4.2 - ^ fluid and K due to missed  2.  Acute hypoxic resp failure ? HCAP/fever - abtx stopped 1/26 - volume was a component 3.  Pulmonary edema - 2.3 off 1/24 2.4 1/25 and 1 L 2/26 - post wt 40.4  3. Anemia - hgb 11.9 - no ESA- cont venofer after d/c 4. Secondary hyperparathyroidism - Ca/P ok - resume hectorol Tuesday if not d/c 5. HTN/volume - getting near EDW/BP variable 6. Nutrition  Alb 2.7 7. DM 8. ? D/c today-   Sheffield Slider, PA-C [facility] Beeper [phone] 05/17/2017,11:04 AM  LOS: 4 days   Pt seen, examined and agree w A/P as above.  Vinson Moselle MD [facility] pager [phone]   05/17/2017, 1:39 PM    Subjective:   LUQ pain on palpation  Objective Vitals:   05/16/17 1702 05/16/17 2141 05/17/17 0607 05/17/17 0900  BP: (!) 124/52 (!) 142/49 (!) 150/52 (!) 145/52  Pulse: 71 91 88 88  Resp: 16 17 16 16   Temp: 98.6 F (37 C) 99.1 F (37.3 C) 98.3 F (36.8 C) 98.4 F (36.9 C)  TempSrc: Oral Oral Oral Oral  SpO2: 97% 95% 96% 95%  Weight:  41.9 kg (92 lb 6 oz)    Height:       Physical Exam General: NAD at rest Heart: RRR Lungs: no rales Abdomen: soft + bs LUQ tenderness  Extremities: no LE edema  Dialysis Access: L AVF + bruit   Additional Objective Labs: Basic Metabolic Panel: Recent Labs  Lab 05/14/17 0630 05/15/17 0302 05/17/17 0442  NA 140 137 136  K 5.8* 4.6 4.2  CL 106 97* 95*  CO2 18* 27 26  GLUCOSE 92 150* 166*  BUN 125* 52* 40*  CREATININE 6.72* 3.50* 3.21*  CALCIUM 7.7* 8.0* 8.5*  PHOS 6.4*  --  3.8   Liver Function Tests: Recent Labs  Lab 05/14/17 0630 05/17/17 0442  ALBUMIN 2.7* 2.7*   No results for input(s): LIPASE, AMYLASE in the last 168 hours. CBC: Recent Labs  Lab  05/13/17 2018 05/14/17 0210 05/14/17 0630 05/15/17 0302 05/17/17 0442  WBC 9.1 7.7 6.6 5.3 5.2  NEUTROABS 7.0 6.2  --   --   --   HGB 12.6 11.6* 11.0* 11.5* 11.9*  HCT 40.2 36.7 35.6* 36.8 38.5  MCV 87.8 87.0 86.8 86.2 87.1  PLT 301 262 286 273 277    Cardiac Enzymes: No results for input(s): CKTOTAL, CKMB, CKMBINDEX, TROPONINI in the last 168 hours. CBG: Recent Labs  Lab 05/15/17 2103 05/16/17 1217 05/16/17 1700 05/16/17 2146 05/17/17 0749  GLUCAP 197* 244* 190* 228* 187*   Lab Results  Component Value Date   INR 1.03 02/02/2017   Medications: . ceFEPime (MAXIPIME) IV Stopped (05/16/17 2320)   . azithromycin  500 mg Oral Daily  . calcium acetate  667 mg Oral TID WC  . doxercalciferol  2 mcg Intravenous Q T,Th,Sa-HD  . feeding supplement (NEPRO CARB STEADY)  237 mL Oral TID AC  . heparin  1,400 Units Dialysis Once in dialysis  . heparin  5,000 Units Subcutaneous Q8H  . insulin aspart  0-15 Units Subcutaneous TID WC  . insulin aspart  0-5 Units Subcutaneous QHS  . mouth rinse  15 mL Mouth Rinse BID  . multivitamin  1 tablet Oral QHS  . pneumococcal 23 valent vaccine  0.5 mL Intramuscular Tomorrow-1000

## 2017-05-17 NOTE — Discharge Summary (Signed)
Physician Discharge Summary  Frances Reeves:[id number] DOB: Apr 20, 1951 DOA: 05/13/2017  PCP: System, Pcp Not In  Admit date: 05/13/2017 Discharge date: 05/17/2017  Admitted From: Home Disposition: Home  Recommendations for Outpatient Follow-up:  1. Follow up with PCP in 1-2 weeks 2. Please obtain BMP/CBC in one week  Home Health: None Equipment/Devices: None  Discharge Condition: Stable CODE STATUS: Full Diet recommendation: Sodium restricted renal diet  Brief/Interim Summary:  ##) Acute hypoxic respiratory failure: Patient was admitted with acute hypoxic respiratory failure in the setting of noncompliance with dialysis.  She was also noted to be hyperkalemic at that time.  She was given urgent dialysis and was quickly weaned to room air.  ##) Possible community acquired pneumonia: On admission pro-calcitonin was noted to be elevated.  Patient was started on cefepime and vancomycin empirically.  No blood cultures were taken and flu swabs are performed.  Chest x-ray showed bilateral infiltrates though it was unclear if this was secondary to fluid overload versus pneumonia.  I have elevated white count.  Repeat pro calcitonin was essentially the same.  Patient was discharged home on oral cefdinir and azithromycin for possible community acquired pneumonia.  ##) ESRD: Nephrology was consulted and patient was maintained on dialysis regimen.  ##) T2DM: Patient was maintained on sliding scale here.    Discharge Diagnoses:  Active Problems:   DM (diabetes mellitus) (HCC)   ESRD (end stage renal disease) (HCC)   Noncompliance of patient with renal dialysis [facility])    Discharge Instructions  Discharge Instructions    Call MD for:  difficulty breathing, headache or visual disturbances   Complete by:  As directed    Call MD for:  persistant dizziness or light-headedness   Complete by:  As directed    Call MD for:  temperature >100.4   Complete by:  As directed    Diet - low  sodium heart healthy   Complete by:  As directed    Discharge instructions   Complete by:  As directed    Please follow-up with your primary care doctor in 1 week.   Increase activity slowly   Complete by:  As directed      Allergies as of 05/17/2017   No Known Allergies     Medication List    TAKE these medications   acetaminophen 650 MG CR tablet Commonly known as:  TYLENOL Take 650 mg by mouth every 8 (eight) hours as needed for pain.   azithromycin 500 MG tablet Commonly known as:  ZITHROMAX Take 1 tablet (500 mg total) by mouth daily. Start taking on:  05/18/2017   calcium acetate 667 MG capsule Commonly known as:  PHOSLO Take 667 mg by mouth 3 (three) times daily with meals.   cefdinir 300 MG capsule Commonly known as:  OMNICEF Take 1 capsule (300 mg total) by mouth 2 (two) times daily.   glimepiride 4 MG tablet Commonly known as:  AMARYL Take 4 mg by mouth daily.       No Known Allergies  Consultations: Nephrology  Procedures/Studies: Dg Chest Portable 1 View  Result Date: 05/13/2017 CLINICAL DATA:  Shortness of breath.  History of diabetes. EXAM: PORTABLE CHEST 1 VIEW COMPARISON:  02/07/2017; 11/24/2016 FINDINGS: Grossly unchanged enlarged cardiac silhouette and mediastinal contours. Interval removal of right jugular approach dialysis catheter. The pulmonary vasculature is less distinct than present examination with cephalization of flow. Persistent at least small sized bilateral effusions with associated bibasilar heterogeneous/consolidative opacities, left greater than right. No new  focal airspace opacities. No pneumothorax. No acute osseus abnormalities. IMPRESSION: Suspected pulmonary edema with at least small sized effusions and associated bibasilar opacities, left greater than right, atelectasis versus infiltrate. A follow-up chest radiograph in 3 to 4 weeks after treatment is recommended to ensure resolution. Electronically Signed   By: Simonne Come M.D.    On: 05/13/2017 20:47     Subjective:   Discharge Exam: Vitals:   05/17/17 0607 05/17/17 0900  BP: (!) 150/52 (!) 145/52  Pulse: 88 88  Resp: 16 16  Temp: 98.3 F (36.8 C) 98.4 F (36.9 C)  SpO2: 96% 95%   Vitals:   05/16/17 1702 05/16/17 2141 05/17/17 0607 05/17/17 0900  BP: (!) 124/52 (!) 142/49 (!) 150/52 (!) 145/52  Pulse: 71 91 88 88  Resp: 16 17 16 16   Temp: 98.6 F (37 C) 99.1 F (37.3 C) 98.3 F (36.8 C) 98.4 F (36.9 C)  TempSrc: Oral Oral Oral Oral  SpO2: 97% 95% 96% 95%  Weight:  41.9 kg (92 lb 6 oz)    Height:        General: Pt is alert, awake, not in acute distress Cardiovascular: RRR, radiating murmur from fistula Respiratory: CTA bilaterally, no wheezing, no rhonchi Abdominal: Soft, NT, ND, bowel sounds + Extremities: no edema, no cyanosis, left AV fistula with thrill and bruit    The results of significant diagnostics from this hospitalization (including imaging, microbiology, ancillary and laboratory) are listed below for reference.     Microbiology: Recent Results (from the past 240 hour(s))  MRSA PCR Screening     Status: None   Collection Time: 05/14/17  3:35 AM  Result Value Ref Range Status   MRSA by PCR NEGATIVE NEGATIVE Final    Comment:        The GeneXpert MRSA Assay (FDA approved for NASAL specimens only), is one component of a comprehensive MRSA colonization surveillance program. It is not intended to diagnose MRSA infection nor to guide or monitor treatment for MRSA infections.      Labs: BNP (last 3 results) No results for input(s): BNP in the last 8760 hours. Basic Metabolic Panel: Recent Labs  Lab 05/13/17 2018 05/14/17 0200 05/14/17 0630 05/15/17 0302 05/17/17 0442  NA 140 139 140 137 136  K 6.5* 6.6* 5.8* 4.6 4.2  CL 103 105 106 97* 95*  CO2 20* 18* 18* 27 26  GLUCOSE 233* 135* 92 150* 166*  BUN 118* 142* 125* 52* 40*  CREATININE 7.44* 6.74* 6.72* 3.50* 3.21*  CALCIUM 7.6* 7.6* 7.7* 8.0* 8.5*  MG  --    --   --   --  2.7*  PHOS  --   --  6.4*  --  3.8   Liver Function Tests: Recent Labs  Lab 05/14/17 0630 05/17/17 0442  ALBUMIN 2.7* 2.7*   No results for input(s): LIPASE, AMYLASE in the last 168 hours. No results for input(s): AMMONIA in the last 168 hours. CBC: Recent Labs  Lab 05/13/17 2018 05/14/17 0210 05/14/17 0630 05/15/17 0302 05/17/17 0442  WBC 9.1 7.7 6.6 5.3 5.2  NEUTROABS 7.0 6.2  --   --   --   HGB 12.6 11.6* 11.0* 11.5* 11.9*  HCT 40.2 36.7 35.6* 36.8 38.5  MCV 87.8 87.0 86.8 86.2 87.1  PLT 301 262 286 273 277   Cardiac Enzymes: No results for input(s): CKTOTAL, CKMB, CKMBINDEX, TROPONINI in the last 168 hours. BNP: Invalid input(s): POCBNP CBG: Recent Labs  Lab 05/16/17 1217 05/16/17 1700  05/16/17 2146 05/17/17 0749 05/17/17 1206  GLUCAP 244* 190* 228* 187* 196*   D-Dimer No results for input(s): DDIMER in the last 72 hours. Hgb A1c No results for input(s): HGBA1C in the last 72 hours. Lipid Profile No results for input(s): CHOL, HDL, LDLCALC, TRIG, CHOLHDL, LDLDIRECT in the last 72 hours. Thyroid function studies No results for input(s): TSH, T4TOTAL, T3FREE, THYROIDAB in the last 72 hours.  Invalid input(s): FREET3 Anemia work up No results for input(s): VITAMINB12, FOLATE, FERRITIN, TIBC, IRON, RETICCTPCT in the last 72 hours. Urinalysis    Component Value Date/Time   COLORURINE STRAW (A) 07/08/2016 1148   APPEARANCEUR CLEAR 07/08/2016 1148   LABSPEC 1.020 07/08/2016 1148   PHURINE 6.0 07/08/2016 1148   GLUCOSEU >=500 (A) 07/08/2016 1148   HGBUR NEGATIVE 07/08/2016 1148   BILIRUBINUR NEGATIVE 07/08/2016 1148   KETONESUR NEGATIVE 07/08/2016 1148   PROTEINUR >=300 (A) 07/08/2016 1148   UROBILINOGEN 0.2 02/20/2015 2223   NITRITE NEGATIVE 07/08/2016 1148   LEUKOCYTESUR NEGATIVE 07/08/2016 1148   Sepsis Labs Invalid input(s): PROCALCITONIN,  WBC,  LACTICIDVEN Microbiology Recent Results (from the past 240 hour(s))  MRSA PCR  Screening     Status: None   Collection Time: 05/14/17  3:35 AM  Result Value Ref Range Status   MRSA by PCR NEGATIVE NEGATIVE Final    Comment:        The GeneXpert MRSA Assay (FDA approved for NASAL specimens only), is one component of a comprehensive MRSA colonization surveillance program. It is not intended to diagnose MRSA infection nor to guide or monitor treatment for MRSA infections.      Time coordinating discharge: Over 30 minutes  SIGNED:   Delaine LameShrey C Kjuan Seipp, MD  [facility] 05/17/2017, 12:52 PM   If 7PM-7AM, please contact night-coverage [email or web address] Password [email or web address]

## 2017-08-29 ENCOUNTER — Encounter (HOSPITAL_COMMUNITY): Payer: Self-pay | Admitting: Emergency Medicine

## 2017-08-29 ENCOUNTER — Emergency Department (HOSPITAL_COMMUNITY): Payer: Medicare Other

## 2017-08-29 ENCOUNTER — Inpatient Hospital Stay (HOSPITAL_COMMUNITY)
Admission: EM | Admit: 2017-08-29 | Discharge: 2017-09-04 | DRG: 535 | Disposition: A | Discharge status: Skilled Nursing Facility | Payer: Medicare Other | Attending: Internal Medicine | Admitting: Internal Medicine

## 2017-08-29 DIAGNOSIS — Z9115 Patient's noncompliance with renal dialysis: Secondary | ICD-10-CM

## 2017-08-29 DIAGNOSIS — W19XXXA Unspecified fall, initial encounter: Secondary | ICD-10-CM

## 2017-08-29 DIAGNOSIS — R05 Cough: Secondary | ICD-10-CM

## 2017-08-29 DIAGNOSIS — K219 Gastro-esophageal reflux disease without esophagitis: Secondary | ICD-10-CM | POA: Diagnosis present

## 2017-08-29 DIAGNOSIS — Z9841 Cataract extraction status, right eye: Secondary | ICD-10-CM

## 2017-08-29 DIAGNOSIS — D649 Anemia, unspecified: Secondary | ICD-10-CM | POA: Diagnosis present

## 2017-08-29 DIAGNOSIS — IMO0002 Reserved for concepts with insufficient information to code with codable children: Secondary | ICD-10-CM

## 2017-08-29 DIAGNOSIS — E11319 Type 2 diabetes mellitus with unspecified diabetic retinopathy without macular edema: Secondary | ICD-10-CM | POA: Diagnosis present

## 2017-08-29 DIAGNOSIS — W010XXA Fall on same level from slipping, tripping and stumbling without subsequent striking against object, initial encounter: Secondary | ICD-10-CM | POA: Diagnosis present

## 2017-08-29 DIAGNOSIS — E875 Hyperkalemia: Secondary | ICD-10-CM | POA: Diagnosis present

## 2017-08-29 DIAGNOSIS — Z87448 Personal history of other diseases of urinary system: Secondary | ICD-10-CM

## 2017-08-29 DIAGNOSIS — N186 End stage renal disease: Secondary | ICD-10-CM | POA: Diagnosis present

## 2017-08-29 DIAGNOSIS — Z992 Dependence on renal dialysis: Secondary | ICD-10-CM

## 2017-08-29 DIAGNOSIS — Z9842 Cataract extraction status, left eye: Secondary | ICD-10-CM

## 2017-08-29 DIAGNOSIS — S32592A Other specified fracture of left pubis, initial encounter for closed fracture: Principal | ICD-10-CM

## 2017-08-29 DIAGNOSIS — Y92009 Unspecified place in unspecified non-institutional (private) residence as the place of occurrence of the external cause: Secondary | ICD-10-CM

## 2017-08-29 DIAGNOSIS — R1310 Dysphagia, unspecified: Secondary | ICD-10-CM

## 2017-08-29 DIAGNOSIS — E1165 Type 2 diabetes mellitus with hyperglycemia: Secondary | ICD-10-CM

## 2017-08-29 DIAGNOSIS — K210 Gastro-esophageal reflux disease with esophagitis: Secondary | ICD-10-CM | POA: Diagnosis present

## 2017-08-29 DIAGNOSIS — S32599A Other specified fracture of unspecified pubis, initial encounter for closed fracture: Secondary | ICD-10-CM | POA: Diagnosis present

## 2017-08-29 DIAGNOSIS — I132 Hypertensive heart and chronic kidney disease with heart failure and with stage 5 chronic kidney disease, or end stage renal disease: Secondary | ICD-10-CM | POA: Diagnosis present

## 2017-08-29 DIAGNOSIS — N2581 Secondary hyperparathyroidism of renal origin: Secondary | ICD-10-CM | POA: Diagnosis present

## 2017-08-29 DIAGNOSIS — Z8673 Personal history of transient ischemic attack (TIA), and cerebral infarction without residual deficits: Secondary | ICD-10-CM

## 2017-08-29 DIAGNOSIS — Z681 Body mass index (BMI) 19 or less, adult: Secondary | ICD-10-CM

## 2017-08-29 DIAGNOSIS — S32512A Fracture of superior rim of left pubis, initial encounter for closed fracture: Secondary | ICD-10-CM | POA: Diagnosis present

## 2017-08-29 DIAGNOSIS — E1121 Type 2 diabetes mellitus with diabetic nephropathy: Secondary | ICD-10-CM | POA: Diagnosis present

## 2017-08-29 DIAGNOSIS — R059 Cough, unspecified: Secondary | ICD-10-CM

## 2017-08-29 DIAGNOSIS — I5032 Chronic diastolic (congestive) heart failure: Secondary | ICD-10-CM | POA: Diagnosis present

## 2017-08-29 DIAGNOSIS — Z7984 Long term (current) use of oral hypoglycemic drugs: Secondary | ICD-10-CM

## 2017-08-29 DIAGNOSIS — M898X9 Other specified disorders of bone, unspecified site: Secondary | ICD-10-CM | POA: Diagnosis present

## 2017-08-29 DIAGNOSIS — Z961 Presence of intraocular lens: Secondary | ICD-10-CM | POA: Diagnosis present

## 2017-08-29 DIAGNOSIS — E440 Moderate protein-calorie malnutrition: Secondary | ICD-10-CM | POA: Diagnosis present

## 2017-08-29 DIAGNOSIS — E1122 Type 2 diabetes mellitus with diabetic chronic kidney disease: Secondary | ICD-10-CM | POA: Diagnosis present

## 2017-08-29 LAB — CBC WITH DIFFERENTIAL/PLATELET
Basophils Absolute: 0 10*3/uL (ref 0.0–0.1)
Basophils Relative: 0 %
EOS ABS: 0.1 10*3/uL (ref 0.0–0.7)
Eosinophils Relative: 2 %
HCT: 40.5 % (ref 36.0–46.0)
HEMOGLOBIN: 13.5 g/dL (ref 12.0–15.0)
LYMPHS ABS: 1.2 10*3/uL (ref 0.7–4.0)
Lymphocytes Relative: 20 %
MCH: 30.8 pg (ref 26.0–34.0)
MCHC: 33.3 g/dL (ref 30.0–36.0)
MCV: 92.3 fL (ref 78.0–100.0)
MONO ABS: 0.3 10*3/uL (ref 0.1–1.0)
MONOS PCT: 5 %
NEUTROS PCT: 73 %
Neutro Abs: 4.4 10*3/uL (ref 1.7–7.7)
Platelets: 197 10*3/uL (ref 150–400)
RBC: 4.39 MIL/uL (ref 3.87–5.11)
RDW: 15.4 % (ref 11.5–15.5)
WBC: 6 10*3/uL (ref 4.0–10.5)

## 2017-08-29 LAB — BASIC METABOLIC PANEL
Anion gap: 16 — ABNORMAL HIGH (ref 5–15)
BUN: 65 mg/dL — ABNORMAL HIGH (ref 6–20)
CALCIUM: 8.4 mg/dL — AB (ref 8.9–10.3)
CHLORIDE: 94 mmol/L — AB (ref 101–111)
CO2: 25 mmol/L (ref 22–32)
Creatinine, Ser: 5.37 mg/dL — ABNORMAL HIGH (ref 0.44–1.00)
GFR calc non Af Amer: 7 mL/min — ABNORMAL LOW (ref >60)
GFR, EST AFRICAN AMERICAN: 9 mL/min — AB (ref >60)
GLUCOSE: 249 mg/dL — AB (ref 65–99)
Potassium: 5.1 mmol/L (ref 3.5–5.1)
Sodium: 135 mmol/L (ref 135–145)

## 2017-08-29 MED ORDER — FENTANYL CITRATE (PF) 100 MCG/2ML IJ SOLN
50.0000 ug | Freq: Once | INTRAMUSCULAR | Status: AC
  Administered 2017-08-29: 50 ug via INTRAVENOUS
  Filled 2017-08-29: qty 2

## 2017-08-29 MED ORDER — MORPHINE SULFATE (PF) 4 MG/ML IV SOLN
4.0000 mg | Freq: Once | INTRAVENOUS | Status: AC
  Administered 2017-08-29: 4 mg via INTRAVENOUS
  Filled 2017-08-29: qty 1

## 2017-08-29 NOTE — ED Provider Notes (Signed)
[facility] EMERGENCY DEPARTMENT Provider Note   CSN: [id number] Arrival date & time: 08/29/17  2133     History   Chief Complaint Chief Complaint  Patient presents with  . Fall    HPI Frances Reeves is a 67 y.o. female with past medical history of CVA, diabetes, ESRD on hemodialysis (Tuesday, Thursday, Saturday) who presents emergency department today for fall.  Patient states that she normally ambulates with a cane or walker.  She was at her home where she lives by herself and had a mechanical fall where she fell onto her buttocks.  She notes that since that time she has been am able to ambulate secondary to the pain that she is having in her bilateral hips, sacrum as well as low back.  She has not taken anything for symptoms.  She notes that she was supposed to have dialysis today but missed her dialysis that she was unable to receive transportation.  She reports that her pain is currently a 10/10.  She denies any bowel/bladder incontinence, urinary retention, saddle anesthesia, numbness/tingling/weakness of the lower extremities.  No other symptoms at this time.  HPI  Past Medical History:  Diagnosis Date  . Anemia   . Chronic kidney disease   . Diabetes mellitus without complication (HCC)   . GERD (gastroesophageal reflux disease)   . Stroke [facility])    in her 20's    Patient Active Problem List   Diagnosis Date Noted  . Noncompliance of patient with renal dialysis (HCC) 05/13/2017  . DM (diabetes mellitus) (HCC) 02/07/2017  . ESRD (end stage renal disease) (HCC) 02/07/2017  . GERD (gastroesophageal reflux disease) 02/07/2017  . Anemia 02/07/2017  . History of CVA (cerebrovascular accident) 02/07/2017  . Chest pain 02/07/2017  . Shortness of breath 02/07/2017    Past Surgical History:  Procedure Laterality Date  . APPENDECTOMY    . BASCILIC VEIN TRANSPOSITION Left 11/24/2016   Procedure: FIRST STAGE BASCILIC VEIN TRANSPOSITION;  Surgeon: Maeola Harman, MD;  Location: [facility] OR;  Service: Vascular;  Laterality: Left;  . EYE SURGERY Bilateral    cataract surgery with lens implant  . INSERTION OF DIALYSIS CATHETER Right 11/24/2016   Procedure: INSERTION OF TUNNELED DIALYSIS CATHETER;  Surgeon: Maeola Harman, MD;  Location: [facility] OR;  Service: Vascular;  Laterality: Right;     OB History   None      Home Medications    Prior to Admission medications   Medication Sig Start Date End Date Taking? Authorizing Provider  acetaminophen (TYLENOL) 650 MG CR tablet Take 650 mg by mouth every 8 (eight) hours as needed for pain.    [provider]  azithromycin (ZITHROMAX) 500 MG tablet Take 1 tablet (500 mg total) by mouth daily. 05/18/17   Purohit, Salli Quarry, MD  calcium acetate (PHOSLO) 667 MG capsule Take 667 mg by mouth 3 (three) times daily with meals. 02/20/17   [provider]  cefdinir (OMNICEF) 300 MG capsule Take 1 capsule (300 mg total) by mouth 2 (two) times daily. 05/17/17   Purohit, Salli Quarry, MD  glimepiride (AMARYL) 4 MG tablet Take 4 mg by mouth daily. 11/03/16   [provider]    Family History History reviewed. No pertinent family history.  Social History Social History   Tobacco Use  . Smoking status: Never Smoker  . Smokeless tobacco: Never Used  Substance Use Topics  . Alcohol use: No  . Drug use: No  Allergies   Patient has no known allergies.   Review of Systems Review of Systems  All other systems reviewed and are negative.    Physical Exam Updated Vital Signs BP (!) 155/63 (BP Location: Right Arm)   Pulse 87   Temp 98.2 F (36.8 C) (Oral)   Resp 18   Ht  (1.473 m)   Wt 44.6 kg (98 lb 5.2 oz)   SpO2 99%   BMI 20.55 kg/m   Physical Exam  Constitutional: She appears well-developed and well-nourished.  HENT:  Head: Normocephalic and atraumatic.  Right Ear: External ear normal.  Left Ear: External ear normal.  Nose: Nose normal.    Mouth/Throat: Uvula is midline, oropharynx is clear and moist and mucous membranes are normal. No tonsillar exudate.  Eyes: Pupils are equal, round, and reactive to light. Right eye exhibits no discharge. Left eye exhibits no discharge. No scleral icterus.  Neck: Trachea normal. Neck supple. No spinous process tenderness present. No neck rigidity. Normal range of motion present.  Cardiovascular: Normal rate, regular rhythm and intact distal pulses.  No murmur heard. Pulses:      Radial pulses are 2+ on the right side, and 2+ on the left side.       Dorsalis pedis pulses are 2+ on the right side, and 2+ on the left side.       Posterior tibial pulses are 2+ on the right side, and 2+ on the left side.  No lower extremity swelling or edema. Calves symmetric in size bilaterally.  Pulmonary/Chest: Effort normal and breath sounds normal. She exhibits no tenderness.  Abdominal: Soft. Bowel sounds are normal. There is no tenderness. There is no rebound and no guarding.  Musculoskeletal: She exhibits no edema.  Patient without any cervical, thoracic or lumbar spinous process tenderness palpation.  She has significant tenderness palpation with rocking of the sacrum.  + tenderness of bilateral hips. Positive logroll test bilaterally.  No shortening or external rotation of either leg.  She is able to actively move the lower extremity .  Compartments are soft distally and she is neurovascular intact.   Lymphadenopathy:    She has no cervical adenopathy.  Neurological: She is alert. She has normal strength. No sensory deficit.  Skin: Skin is warm and dry. Capillary refill takes less than 2 seconds. No rash noted. She is not diaphoretic.  Psychiatric: She has a normal mood and affect.  Nursing note and vitals reviewed.    ED Treatments / Results  Labs (all labs ordered are listed, but only abnormal results are displayed) Labs Reviewed  BASIC METABOLIC PANEL - Abnormal; Notable for the following  components:      Result Value   Chloride 94 (*)    Glucose, Bld 249 (*)    BUN 65 (*)    Creatinine, Ser 5.37 (*)    Calcium 8.4 (*)    GFR calc non Af Amer 7 (*)    GFR calc Af Amer 9 (*)    Anion gap 16 (*)    All other components within normal limits  CBC WITH DIFFERENTIAL/PLATELET    EKG EKG Interpretation  Date/Time:  Saturday Aug 29 2017 22:55:44 EDT Ventricular Rate:  90 PR Interval:  140 QRS Duration: 80 QT Interval:  380 QTC Calculation: 464 R Axis:   76 Text Interpretation:  Normal sinus rhythm Possible Left atrial enlargement Nonspecific ST and T wave abnormality Abnormal ECG When compared with ECG of 05/14/2017, No significant change was found  Confirmed by Dione Booze ([id number]) on 08/29/2017 11:39:42 PM   Radiology Dg Lumbar Spine Complete  Result Date: 08/29/2017 CLINICAL DATA:  Recent fall with low back pain, initial encounter EXAM: LUMBAR SPINE - COMPLETE 4+ VIEW COMPARISON:  04/18/2006 FINDINGS: Five lumbar type vertebral bodies are well visualized. Vertebral body height is well maintained. No pars defects are seen. No compression deformity is noted. No anterolisthesis is seen. No definitive soft tissue abnormality is noted. The known pubic rami fractures are not well appreciated on this exam. IMPRESSION: No acute abnormality in the lumbar spine. Electronically Signed   By: Alcide Clever M.D.   On: 08/29/2017 22:30   Dg Hips Bilat With Pelvis 2v  Result Date: 08/29/2017 CLINICAL DATA:  Recent fall with hip pain, initial encounter EXAM: DG HIP (WITH OR WITHOUT PELVIS) 2V BILAT COMPARISON:  None. FINDINGS: The proximal femurs are within normal limits. There is a fracture through the superior pubic ramus on the left with only minimal displacement. Mild irregularity of the inferior pubic ramus on the left is noted as well consistent with an undisplaced fracture. No other definitive fracture is seen. No soft tissue changes are noted. IMPRESSION: Fractures through the superior  and inferior pubic rami on the left. Electronically Signed   By: Alcide Clever M.D.   On: 08/29/2017 22:29    Procedures Procedures (including critical care time)  Medications Ordered in ED Medications  fentaNYL (SUBLIMAZE) injection 50 mcg (has no administration in time range)     Initial Impression / Assessment and Plan / ED Course  I have reviewed the triage vital signs and the nursing notes.  Pertinent labs & imaging results that were available during my care of the patient were reviewed by me and considered in my medical decision making (see chart for details).     67 y.o. with past medical history of end-stage renal disease on hemodialysis (Tuesday, Thursday, Saturday) presenting for mechanical fall at home with sacral pain as well as bilateral hip pain.  Patient states that her pain is a 10/10.  She denies any paresthesias of the lower extremity.  No bowel/bladder incontinence, urinary retention or saddle anesthesia. No concern for cauda equina.  Patient did miss dialysis today.  Will obtain blood work.  Patient is neurovascular intact distally with compartments soft.  No other complaints at this time.  X-ray of the lumbar spine negative.  Patient does have left superior and inferior pubic rami fractures.  I discussed this with Dr. Magnus Ivan of orthopedics who recommended no further imaging at this time.  Recommended this is conservative therapy with weightbearing as tolerated. They are on board. Despite pain medication patient is not able to weight bear in the department. Feel she will need to be admitted for pain control. Blood work reviewed.  No significant electrolyte derangements.  Patient does have elevation of creatinine and BUN but is near prior baselines. Anion gap 16. Pain currently controlled. I appreciate Dr. Adela Glimpse for bringing the patient into the hospital. Patient appears safe for admission. Patient case seen and discussed with Dr. Rush Landmark who is in agreement with plan.    Final Clinical Impressions(s) / ED Diagnoses   Final diagnoses:  Closed fracture of left superior pubic ramus, initial encounter (HCC)  Closed fracture of multiple rami of left pubis, initial encounter (HCC)  Fall in home, initial encounter  History of end stage renal disease    ED Discharge Orders    None       Maczis, Elmer Sow,  PA-C 08/30/17 0051    Tegeler, Canary Brim, MD 08/31/17 [phone]

## 2017-08-29 NOTE — ED Triage Notes (Signed)
Per EMS pt was at home and had a fall today around 1300.  She called her friend and told her about the fall but stated she was ok.  Later in the evening her friend came over and found her in a chair crying because of bilateral hip pain and back pain 10/10.  NAD noted at this time.

## 2017-08-30 ENCOUNTER — Emergency Department (HOSPITAL_COMMUNITY): Payer: Medicare Other

## 2017-08-30 ENCOUNTER — Encounter (HOSPITAL_COMMUNITY): Payer: Self-pay | Admitting: Internal Medicine

## 2017-08-30 DIAGNOSIS — Z681 Body mass index (BMI) 19 or less, adult: Secondary | ICD-10-CM | POA: Diagnosis not present

## 2017-08-30 DIAGNOSIS — I5032 Chronic diastolic (congestive) heart failure: Secondary | ICD-10-CM | POA: Diagnosis present

## 2017-08-30 DIAGNOSIS — R1310 Dysphagia, unspecified: Secondary | ICD-10-CM | POA: Diagnosis present

## 2017-08-30 DIAGNOSIS — N2581 Secondary hyperparathyroidism of renal origin: Secondary | ICD-10-CM | POA: Diagnosis present

## 2017-08-30 DIAGNOSIS — K210 Gastro-esophageal reflux disease with esophagitis: Secondary | ICD-10-CM | POA: Diagnosis present

## 2017-08-30 DIAGNOSIS — R05 Cough: Secondary | ICD-10-CM

## 2017-08-30 DIAGNOSIS — S32592A Other specified fracture of left pubis, initial encounter for closed fracture: Secondary | ICD-10-CM | POA: Diagnosis present

## 2017-08-30 DIAGNOSIS — K219 Gastro-esophageal reflux disease without esophagitis: Secondary | ICD-10-CM | POA: Diagnosis not present

## 2017-08-30 DIAGNOSIS — Z992 Dependence on renal dialysis: Secondary | ICD-10-CM | POA: Diagnosis not present

## 2017-08-30 DIAGNOSIS — Z9841 Cataract extraction status, right eye: Secondary | ICD-10-CM | POA: Diagnosis not present

## 2017-08-30 DIAGNOSIS — N186 End stage renal disease: Secondary | ICD-10-CM | POA: Diagnosis present

## 2017-08-30 DIAGNOSIS — E875 Hyperkalemia: Secondary | ICD-10-CM | POA: Diagnosis present

## 2017-08-30 DIAGNOSIS — E1122 Type 2 diabetes mellitus with diabetic chronic kidney disease: Secondary | ICD-10-CM | POA: Diagnosis present

## 2017-08-30 DIAGNOSIS — E11319 Type 2 diabetes mellitus with unspecified diabetic retinopathy without macular edema: Secondary | ICD-10-CM | POA: Diagnosis present

## 2017-08-30 DIAGNOSIS — S32512A Fracture of superior rim of left pubis, initial encounter for closed fracture: Secondary | ICD-10-CM | POA: Diagnosis present

## 2017-08-30 DIAGNOSIS — E1121 Type 2 diabetes mellitus with diabetic nephropathy: Secondary | ICD-10-CM | POA: Diagnosis present

## 2017-08-30 DIAGNOSIS — W010XXA Fall on same level from slipping, tripping and stumbling without subsequent striking against object, initial encounter: Secondary | ICD-10-CM | POA: Diagnosis present

## 2017-08-30 DIAGNOSIS — Z7984 Long term (current) use of oral hypoglycemic drugs: Secondary | ICD-10-CM | POA: Diagnosis not present

## 2017-08-30 DIAGNOSIS — Z8673 Personal history of transient ischemic attack (TIA), and cerebral infarction without residual deficits: Secondary | ICD-10-CM

## 2017-08-30 DIAGNOSIS — S32599A Other specified fracture of unspecified pubis, initial encounter for closed fracture: Secondary | ICD-10-CM | POA: Diagnosis present

## 2017-08-30 DIAGNOSIS — W19XXXA Unspecified fall, initial encounter: Secondary | ICD-10-CM

## 2017-08-30 DIAGNOSIS — Z9842 Cataract extraction status, left eye: Secondary | ICD-10-CM | POA: Diagnosis not present

## 2017-08-30 DIAGNOSIS — Z961 Presence of intraocular lens: Secondary | ICD-10-CM | POA: Diagnosis present

## 2017-08-30 DIAGNOSIS — M898X9 Other specified disorders of bone, unspecified site: Secondary | ICD-10-CM | POA: Diagnosis present

## 2017-08-30 DIAGNOSIS — Z9115 Patient's noncompliance with renal dialysis: Secondary | ICD-10-CM | POA: Diagnosis not present

## 2017-08-30 DIAGNOSIS — I132 Hypertensive heart and chronic kidney disease with heart failure and with stage 5 chronic kidney disease, or end stage renal disease: Secondary | ICD-10-CM | POA: Diagnosis present

## 2017-08-30 DIAGNOSIS — E440 Moderate protein-calorie malnutrition: Secondary | ICD-10-CM | POA: Diagnosis present

## 2017-08-30 DIAGNOSIS — D649 Anemia, unspecified: Secondary | ICD-10-CM | POA: Diagnosis present

## 2017-08-30 LAB — CBC
HEMATOCRIT: 39.4 % (ref 36.0–46.0)
Hemoglobin: 12.8 g/dL (ref 12.0–15.0)
MCH: 30.1 pg (ref 26.0–34.0)
MCHC: 32.5 g/dL (ref 30.0–36.0)
MCV: 92.7 fL (ref 78.0–100.0)
PLATELETS: 177 10*3/uL (ref 150–400)
RBC: 4.25 MIL/uL (ref 3.87–5.11)
RDW: 15.7 % — AB (ref 11.5–15.5)
WBC: 6.6 10*3/uL (ref 4.0–10.5)

## 2017-08-30 LAB — BASIC METABOLIC PANEL
Anion gap: 14 (ref 5–15)
BUN: 68 mg/dL — ABNORMAL HIGH (ref 6–20)
CALCIUM: 8.3 mg/dL — AB (ref 8.9–10.3)
CO2: 26 mmol/L (ref 22–32)
CREATININE: 5.55 mg/dL — AB (ref 0.44–1.00)
Chloride: 97 mmol/L — ABNORMAL LOW (ref 101–111)
GFR calc non Af Amer: 7 mL/min — ABNORMAL LOW (ref >60)
GFR, EST AFRICAN AMERICAN: 8 mL/min — AB (ref >60)
Glucose, Bld: 321 mg/dL — ABNORMAL HIGH (ref 65–99)
Potassium: 5.7 mmol/L — ABNORMAL HIGH (ref 3.5–5.1)
SODIUM: 137 mmol/L (ref 135–145)

## 2017-08-30 LAB — HEMOGLOBIN A1C
Hgb A1c MFr Bld: 10.2 % — ABNORMAL HIGH (ref 4.8–5.6)
Mean Plasma Glucose: 246.04 mg/dL

## 2017-08-30 LAB — CBG MONITORING, ED
Glucose-Capillary: 254 mg/dL — ABNORMAL HIGH (ref 65–99)
Glucose-Capillary: 98 mg/dL (ref 65–99)

## 2017-08-30 LAB — ALBUMIN: Albumin: 3.2 g/dL — ABNORMAL LOW (ref 3.5–5.0)

## 2017-08-30 LAB — GLUCOSE, CAPILLARY: Glucose-Capillary: 194 mg/dL — ABNORMAL HIGH (ref 65–99)

## 2017-08-30 MED ORDER — MORPHINE SULFATE (PF) 4 MG/ML IV SOLN
INTRAVENOUS | Status: AC
  Filled 2017-08-30: qty 1

## 2017-08-30 MED ORDER — HYDROCODONE-ACETAMINOPHEN 5-325 MG PO TABS
ORAL_TABLET | ORAL | Status: AC
  Administered 2017-08-30: 2 via ORAL
  Filled 2017-08-30: qty 2

## 2017-08-30 MED ORDER — INSULIN ASPART 100 UNIT/ML ~~LOC~~ SOLN
0.0000 [IU] | Freq: Three times a day (TID) | SUBCUTANEOUS | Status: DC
  Administered 2017-08-30: 5 [IU] via SUBCUTANEOUS
  Administered 2017-08-31: 2 [IU] via SUBCUTANEOUS
  Administered 2017-08-31: 3 [IU] via SUBCUTANEOUS
  Administered 2017-08-31: 2 [IU] via SUBCUTANEOUS
  Administered 2017-09-01: 1 [IU] via SUBCUTANEOUS
  Administered 2017-09-01: 3 [IU] via SUBCUTANEOUS
  Administered 2017-09-01: 2 [IU] via SUBCUTANEOUS
  Administered 2017-09-02: 7 [IU] via SUBCUTANEOUS
  Administered 2017-09-02: 3 [IU] via SUBCUTANEOUS
  Administered 2017-09-03: 5 [IU] via SUBCUTANEOUS
  Administered 2017-09-03: 3 [IU] via SUBCUTANEOUS
  Administered 2017-09-03: 2 [IU] via SUBCUTANEOUS
  Administered 2017-09-04: 7 [IU] via SUBCUTANEOUS
  Administered 2017-09-04: 2 [IU] via SUBCUTANEOUS
  Filled 2017-08-30: qty 1

## 2017-08-30 MED ORDER — METHOCARBAMOL 1000 MG/10ML IJ SOLN
500.0000 mg | Freq: Four times a day (QID) | INTRAVENOUS | Status: DC | PRN
  Filled 2017-08-30: qty 5

## 2017-08-30 MED ORDER — MORPHINE SULFATE (PF) 4 MG/ML IV SOLN
1.0000 mg | Freq: Once | INTRAVENOUS | Status: AC
  Administered 2017-08-30: 1 mg via INTRAVENOUS

## 2017-08-30 MED ORDER — RENA-VITE PO TABS
1.0000 | ORAL_TABLET | Freq: Every day | ORAL | Status: DC
  Administered 2017-08-30 – 2017-09-03 (×5): 1 via ORAL
  Filled 2017-08-30 (×6): qty 1

## 2017-08-30 MED ORDER — MORPHINE SULFATE (PF) 4 MG/ML IV SOLN
0.5000 mg | INTRAVENOUS | Status: DC | PRN
  Administered 2017-08-31 – 2017-09-02 (×2): 0.52 mg via INTRAVENOUS
  Filled 2017-08-30 (×2): qty 1

## 2017-08-30 MED ORDER — SENNA 8.6 MG PO TABS
1.0000 | ORAL_TABLET | Freq: Two times a day (BID) | ORAL | Status: DC
  Administered 2017-08-30 – 2017-09-04 (×11): 8.6 mg via ORAL
  Filled 2017-08-30 (×10): qty 1

## 2017-08-30 MED ORDER — INSULIN ASPART 100 UNIT/ML ~~LOC~~ SOLN
0.0000 [IU] | Freq: Every day | SUBCUTANEOUS | Status: DC
  Administered 2017-09-01 – 2017-09-03 (×2): 2 [IU] via SUBCUTANEOUS

## 2017-08-30 MED ORDER — DOXERCALCIFEROL 4 MCG/2ML IV SOLN
3.0000 ug | INTRAVENOUS | Status: DC
  Administered 2017-09-04: 3 ug via INTRAVENOUS
  Filled 2017-08-30 (×2): qty 2

## 2017-08-30 MED ORDER — HYDROCODONE-ACETAMINOPHEN 5-325 MG PO TABS
1.0000 | ORAL_TABLET | Freq: Four times a day (QID) | ORAL | Status: DC | PRN
  Administered 2017-08-30: 2 via ORAL
  Administered 2017-08-30 (×2): 1 via ORAL
  Administered 2017-08-31 – 2017-09-04 (×6): 2 via ORAL
  Filled 2017-08-30: qty 1
  Filled 2017-08-30 (×5): qty 2
  Filled 2017-08-30: qty 1
  Filled 2017-08-30 (×2): qty 2

## 2017-08-30 MED ORDER — BISACODYL 10 MG RE SUPP
10.0000 mg | Freq: Every day | RECTAL | Status: DC | PRN
  Administered 2017-09-02 (×2): 10 mg via RECTAL
  Filled 2017-08-30 (×2): qty 1

## 2017-08-30 MED ORDER — POLYETHYLENE GLYCOL 3350 17 G PO PACK
17.0000 g | PACK | Freq: Every day | ORAL | Status: DC | PRN
  Filled 2017-08-30: qty 1

## 2017-08-30 MED ORDER — METHOCARBAMOL 500 MG PO TABS
500.0000 mg | ORAL_TABLET | Freq: Four times a day (QID) | ORAL | Status: DC | PRN
  Administered 2017-09-01 – 2017-09-02 (×2): 500 mg via ORAL
  Filled 2017-08-30 (×2): qty 1

## 2017-08-30 NOTE — Consult Note (Addendum)
[facility] Renal Consultation Note  Indication for Consultation:  Management of ESRD/hemodialysis; anemia, hypertension/volume and secondary hyperparathyroidism  HPI: Frances Reeves is a 67 y.o. female with  ESRD presume diabetic nephropathy 1st HD 11/2016 HTN, diabetic retinopathy, neuropathy From [place], [place] speaking admitted post fall at home/she lives  Alone. NO  LoC  Reported   And she co hip pain unable to walk. IN ER noted to have  a Closed  Left Pubic ramus fracture Superior and inferior pubic rami fractures . She missed her outpt hemodialysis Sat 08/29/17   Last HD Thursday 08/27/17  and is complaint  With hd . She reports slipping / falling also in her bathroom last week but not injured she thought . Dr Ninfa Linden  With Ortho consulted and reviewed X Rays ="treat conservatively "no surgery indicated .  Currently in ER  With pain controlled with med's . We are consulted for HD /Esrd issues and plan HD today since missed Saturday. INFO  From chart and Spanish Interpreter  on [employer] .      Past Medical History:  Diagnosis Date  . Anemia   . Chronic kidney disease   . Diabetes mellitus without complication ([place])   . GERD (gastroesophageal reflux disease)   . Stroke [facility])    in her 26's    Past Surgical History:  Procedure Laterality Date  . APPENDECTOMY    . BASCILIC VEIN TRANSPOSITION Left 11/24/2016   Procedure: FIRST STAGE BASCILIC VEIN TRANSPOSITION;  Surgeon: Waynetta Sandy, MD;  Location: [place];  Service: Vascular;  Laterality: Left;  . EYE SURGERY Bilateral    cataract surgery with lens implant  . INSERTION OF DIALYSIS CATHETER Right 11/24/2016   Procedure: INSERTION OF TUNNELED DIALYSIS CATHETER;  Surgeon: Waynetta Sandy, MD;  Location: [place];  Service: Vascular;  Laterality: Right;      Family History  Problem Relation Age of Onset  . Diabetes Other   . CAD Neg Hx   . Cancer Neg Hx   . Renal Disease Neg Hx       reports that she has never smoked. She has never used smokeless tobacco. She reports that she does not drink alcohol or use drugs.  No Known Allergies  Prior to Admission medications   Medication Sig Start Date End Date Taking? Authorizing Provider  acetaminophen (TYLENOL) 650 MG CR tablet Take 650 mg by mouth every 8 (eight) hours as needed for pain.   Yes [provider]  calcium acetate (PHOSLO) 667 MG capsule Take 667 mg by mouth 3 (three) times daily with meals. 02/20/17  Yes [provider]  glimepiride (AMARYL) 4 MG tablet Take 4 mg by mouth daily. 11/03/16  Yes [provider]     Anti-infectives (From admission, onward)   None      Results for orders placed or performed during the hospital encounter of 08/29/17 (from the past 48 hour(s))  Basic metabolic panel     Status: Abnormal   Collection Time: 08/29/17 10:40 PM  Result Value Ref Range   Sodium 135 135 - 145 mmol/L   Potassium 5.1 3.5 - 5.1 mmol/L   Chloride 94 (L) 101 - 111 mmol/L   CO2 25 22 - 32 mmol/L   Glucose, Bld 249 (H) 65 - 99 mg/dL   BUN 65 (H) 6 - 20 mg/dL   Creatinine, Ser 5.37 (H) 0.44 - 1.00 mg/dL   Calcium 8.4 (L) 8.9 - 10.3 mg/dL   GFR  calc non Af Amer 7 (L) >60 mL/min   GFR calc Af Amer 9 (L) >60 mL/min    Comment: (NOTE) The eGFR has been calculated using the CKD EPI equation. This calculation has not been validated in all clinical situations. eGFR's persistently <60 mL/min signify possible Chronic Kidney Disease.    Anion gap 16 (H) 5 - 15    Comment: Performed at [facility], [place] [address], [place], [place] [address]  CBC with Differential     Status: None   Collection Time: 08/29/17 10:40 PM  Result Value Ref Range   WBC 6.0 4.0 - 10.5 K/uL   RBC 4.39 3.87 - 5.11 MIL/uL   Hemoglobin 13.5 12.0 - 15.0 g/dL   HCT 40.5 36.0 - 46.0 %   MCV 92.3 78.0 - 100.0 fL   MCH 30.8 26.0 - 34.0 pg   MCHC 33.3 30.0 - 36.0 g/dL   RDW 15.4 11.5 - 15.5 %   Platelets 197  150 - 400 K/uL   Neutrophils Relative % 73 %   Neutro Abs 4.4 1.7 - 7.7 K/uL   Lymphocytes Relative 20 %   Lymphs Abs 1.2 0.7 - 4.0 K/uL   Monocytes Relative 5 %   Monocytes Absolute 0.3 0.1 - 1.0 K/uL   Eosinophils Relative 2 %   Eosinophils Absolute 0.1 0.0 - 0.7 K/uL   Basophils Relative 0 %   Basophils Absolute 0.0 0.0 - 0.1 K/uL    Comment: Performed at [facility] [address], [place], [place] [address]  CBC     Status: Abnormal   Collection Time: 08/30/17  4:01 AM  Result Value Ref Range   WBC 6.6 4.0 - 10.5 K/uL   RBC 4.25 3.87 - 5.11 MIL/uL   Hemoglobin 12.8 12.0 - 15.0 g/dL   HCT 39.4 36.0 - 46.0 %   MCV 92.7 78.0 - 100.0 fL   MCH 30.1 26.0 - 34.0 pg   MCHC 32.5 30.0 - 36.0 g/dL   RDW 15.7 (H) 11.5 - 15.5 %   Platelets 177 150 - 400 K/uL    Comment: Performed at [facility], [place] [address], [place], [place] [address]  Basic metabolic panel     Status: Abnormal   Collection Time: 08/30/17  4:01 AM  Result Value Ref Range   Sodium 137 135 - 145 mmol/L   Potassium 5.7 (H) 3.5 - 5.1 mmol/L   Chloride 97 (L) 101 - 111 mmol/L   CO2 26 22 - 32 mmol/L   Glucose, Bld 321 (H) 65 - 99 mg/dL   BUN 68 (H) 6 - 20 mg/dL   Creatinine, Ser 5.55 (H) 0.44 - 1.00 mg/dL   Calcium 8.3 (L) 8.9 - 10.3 mg/dL   GFR calc non Af Amer 7 (L) >60 mL/min   GFR calc Af Amer 8 (L) >60 mL/min    Comment: (NOTE) The eGFR has been calculated using the CKD EPI equation. This calculation has not been validated in all clinical situations. eGFR's persistently <60 mL/min signify possible Chronic Kidney Disease.    Anion gap 14 5 - 15    Comment: Performed at [facility] [address], [place], [place] [address]  Albumin     Status: Abnormal   Collection Time: 08/30/17  4:01 AM  Result Value Ref Range   Albumin 3.2 (L) 3.5 - 5.0 g/dL    Comment: Performed at [facility] [address], [place], [place]  [address]  Hemoglobin A1c     Status: Abnormal   Collection  Time: 08/30/17  4:01 AM  Result Value Ref Range   Hgb A1c MFr Bld 10.2 (H) 4.8 - 5.6 %    Comment: (NOTE) Pre diabetes:          5.7%-6.4% Diabetes:              >6.4% Glycemic control for   <7.0% adults with diabetes    Mean Plasma Glucose 246.04 mg/dL    Comment: Performed at [facility] [address], [place], [place] [address]    ROS: see hpi   Physical Exam: Vitals:   08/30/17 0300 08/30/17 0700  BP: (!) 135/59 (!) 123/56  Pulse: 80 77  Resp: 18 16  Temp:    SpO2: 95% 94%      CXR= no chf  General: alert NAD ,thin Hispanic F WD, WN  OX# HEENT: [place] , no trauma , EOMI no icteric  Neck: no jvd, supple Heart: RRR, No r,n,g Lungs: CTA bilt nonlab breathing  Abdomen: BS pos , soft , NT, ND Extremities: no pedal edema  Skin: No overt rash Neuro: alert decreased lower extrem movements sec hip pain , other wise no acute focal deficits appreciated  Dialysis Access: pos bruit LUA AVF  Dialysis Orders: Center: [facility]  on TTS . EDW 43.5 kg HD [place] 2k, 2,25ca  Time 4hr Heparin 1400. Access LUA AVF   Hec 3 mcg IV/HD  No ESA     Assessment/Plan 1. Left -Sided Superior and Inferior Pubic rami fractures - Ortho has seen ="conservative care' 2. ESRD -  HD TTS , will do HD today  With last hd thurs 5/09 3. Hypertension/volume  - bp and vol ok / No bp meds listed  4. Anemia  - hgb 12.8  No op esa , fu hgb trend 5. Metabolic bone disease -  Iv hec on hd , phoslo  binder  6. Nutrition - diet Carb mod renal , renal vit   Ernest Haber, PA-C [place] [phone] 08/30/2017, 8:27 AM    I have seen and examined this patient and agree with plan and assessment in the above note with renal recommendations/intervention highlighted.  Will plan for HD today given her hyperkalemia and missed HD yesterday.  Governor Rooks Dastan Krider,MD 08/30/2017 2:43 PM

## 2017-08-30 NOTE — ED Notes (Signed)
Daughter - [name] [phone]

## 2017-08-30 NOTE — ED Notes (Signed)
Admitting paged to RN Tammy Sours regarding diet orders per his request

## 2017-08-30 NOTE — H&P (Signed)
Frances Reeves ZOX:[id number] DOB: 10/31/1950 DOA: 08/29/2017     PCP: System, Pcp Not In   [facility]   . NEphrology:  Dr. Alvira Monday   Vasc surgery Randie Heinz  Patient arrived to ER on 08/29/17 at 2133  Patient coming from:  home Lives alone,     Chief Complaint:  Chief Complaint  Patient presents with  . Fall    HPI: Frances Reeves is a 67 y.o. female with medical history significant of ESRD on HD, DM 2 anemia GERD,'s history of CVA    Presented with fall coming from home occurring Saturday, May 11 at 1 PM.  Patient fell down on her buttocks it was a mechanical fall. She was trying to reach up and fell backwards.  Patient initially thought she was okay and called her friend to let her know she was fine but later in the evening the friend has came over and found that the patient was sitting down the chair crying because of significant bilateral hip pain and unable to move. At baseline patient needs a cane or walker to ambulate. He was due for hemodialysis today but missed it last HD was Thursday. Reports poor appetite cough for the  Past 1 week, no chest pain,  No current shortness of breath, no fever. No appetite  Last admission was from January 23-27 for community-acquired pneumonia and acute hypoxic respiratory failure secondary to noncompliance with hemodialysis Regarding pertinent Chronic problems: History of end-stage renal disease on hemodialysis Tuesday Thursday Saturday with history of noncompliance   Diabetes not on insulin   While in ER:  Noted to have left superior and inferior pubic rami fractures Following Medications were ordered in ER: Medications  fentaNYL (SUBLIMAZE) injection 50 mcg (50 mcg Intravenous Given 08/29/17 2242)  morphine 4 MG/ML injection 4 mg (4 mg Intravenous Given 08/29/17 2259)    Significant initial  Findings: Abnormal Labs Reviewed  BASIC METABOLIC PANEL - Abnormal; Notable for the following components:   Result Value   Chloride 94 (*)    Glucose, Bld 249 (*)    BUN 65 (*)    Creatinine, Ser 5.37 (*)    Calcium 8.4 (*)    GFR calc non Af Amer 7 (*)    GFR calc Af Amer 9 (*)    Anion gap 16 (*)    All other components within normal limits     Na 135 K 5.1  Cr   Up from baseline see below Lab Results  Component Value Date   CREATININE 5.37 (H) 08/29/2017   CREATININE 3.21 (H) 05/17/2017   CREATININE 3.50 (H) 05/15/2017      WBC  6.0  HG/HCT stable,       Component Value Date/Time   HGB 13.5 08/29/2017 2240   HCT 40.5 08/29/2017 2240   Lactic Acid, Venous    Component Value Date/Time   LATICACIDVEN 1.25 02/02/2017 1628      UA anuric  Lumbar spine unremarkable. Hip imaging fracture of superior and inferior rami of the left    ECG:  Personally reviewed by me showing: HR : 90 Rhythm:  NSR,    no evidence of ischemic changes QTC 464    ED Triage Vitals  Enc Vitals Group     BP 08/29/17 2149 (!) 155/63     Pulse Rate 08/29/17 2149 87     Resp 08/29/17 2149 18     Temp 08/29/17 2149 98.2 F (36.8 C)     Temp  Source 08/29/17 2149 Oral     SpO2 08/29/17 2149 99 %     Weight 08/29/17 2157 98 lb 5.2 oz (44.6 kg)     Height 08/29/17 2157  (1.473 m)     Head Circumference --      Peak Flow --      Pain Score 08/29/17 2157 10     Pain Loc --      Pain Edu? --      Excl. in GC? --   TMAX(24)@       Latest  Blood pressure (!) 156/65, pulse 87, temperature 98.2 F (36.8 C), temperature source Oral, resp. rate 18, height  (1.473 m), weight 44.6 kg (98 lb 5.2 oz), SpO2 99 %.    ER Provider Called: Orthopedics   Dr. Magnus Ivan They Recommend admit to medicine for pain control    Hospitalist was called for admission for control  of pubic rami fractures   Review of Systems:    Pertinent positives include: Pain with ambulation hip pain bilateral  Constitutional:  No weight loss, night sweats, Fevers, chills, fatigue, weight loss  HEENT:  No  headaches, Difficulty swallowing,Tooth/dental problems,Sore throat,  No sneezing, itching, ear ache, nasal congestion, post nasal drip,  Cardio-vascular:  No chest pain, Orthopnea, PND, anasarca, dizziness, palpitations.no Bilateral lower extremity swelling  GI:  No heartburn, indigestion, abdominal pain, nausea, vomiting, diarrhea, change in bowel habits, loss of appetite, melena, blood in stool, hematemesis Resp:  no shortness of breath at rest. No dyspnea on exertion, No excess mucus, no productive cough, No non-productive cough, No coughing up of blood .No change in color of mucus.No wheezing. Skin:  no rash or lesions. No jaundice GU:  no dysuria, change in color of urine, no urgency or frequency. No straining to urinate.  No flank pain.  Musculoskeletal:  No joint pain or no joint swelling. No decreased range of motion. No back pain.  Psych:  No change in mood or affect. No depression or anxiety. No memory loss.  Neuro: no localizing neurological complaints, no tingling, no weakness, no double vision, no gait abnormality, no slurred speech, no confusion  As per HPI otherwise 10 point review of systems negative.   Past Medical History:   Past Medical History:  Diagnosis Date  . Anemia   . Chronic kidney disease   . Diabetes mellitus without complication (HCC)   . GERD (gastroesophageal reflux disease)   . Stroke [facility])    in her 60's      Past Surgical History:  Procedure Laterality Date  . APPENDECTOMY    . BASCILIC VEIN TRANSPOSITION Left 11/24/2016   Procedure: FIRST STAGE BASCILIC VEIN TRANSPOSITION;  Surgeon: Maeola Harman, MD;  Location: [facility] OR;  Service: Vascular;  Laterality: Left;  . EYE SURGERY Bilateral    cataract surgery with lens implant  . INSERTION OF DIALYSIS CATHETER Right 11/24/2016   Procedure: INSERTION OF TUNNELED DIALYSIS CATHETER;  Surgeon: Maeola Harman, MD;  Location: [facility] OR;  Service: Vascular;  Laterality: Right;    Social  History:  Ambulatory cane, walker       reports that she has never smoked. She has never used smokeless tobacco. She reports that she does not drink alcohol or use drugs.     Family History:   Family History  Problem Relation Age of Onset  . Diabetes Other   . CAD Neg Hx   . Cancer Neg Hx   . Renal Disease Neg Hx  Allergies: No Known Allergies   Prior to Admission medications   Medication Sig Start Date End Date Taking? Authorizing Provider  acetaminophen (TYLENOL) 650 MG CR tablet Take 650 mg by mouth every 8 (eight) hours as needed for pain.    [provider]  azithromycin (ZITHROMAX) 500 MG tablet Take 1 tablet (500 mg total) by mouth daily. 05/18/17   Purohit, Salli Quarry, MD  calcium acetate (PHOSLO) 667 MG capsule Take 667 mg by mouth 3 (three) times daily with meals. 02/20/17   [provider]  cefdinir (OMNICEF) 300 MG capsule Take 1 capsule (300 mg total) by mouth 2 (two) times daily. 05/17/17   Purohit, Salli Quarry, MD  glimepiride (AMARYL) 4 MG tablet Take 4 mg by mouth daily. 11/03/16   [provider]   Physical Exam: Blood pressure (!) 156/65, pulse 87, temperature 98.2 F (36.8 C), temperature source Oral, resp. rate 18, height  (1.473 m), weight 44.6 kg (98 lb 5.2 oz), SpO2 99 %. 1. General:  in No Acute distress  Chronically ill thin-appearing 2. Psychological: Alert and  Oriented 3. Head/ENT:    Dry Mucous Membranes                          Head Non traumatic, neck supple                            Poor Dentition 4. SKIN:   decreased Skin turgor,  Skin clean Dry and intact no rash 5. Heart: Regular rate and rhythm no Murmur, no Rub or gallop 6. Lungs:   no wheezes or crackles   7. Abdomen: Soft,  non-tender, Non distended  bowel sounds present 8. Lower extremities: no clubbing, cyanosis, or edema 9. Neurologically Grossly intact, moving all 4 extremities equally   intact 10. MSK: Normal range of motion limited in both lower  extremities   LABS:     Recent Labs  Lab 08/29/17 2240  WBC 6.0  NEUTROABS 4.4  HGB 13.5  HCT 40.5  MCV 92.3  PLT 197   Basic Metabolic Panel: Recent Labs  Lab 08/29/17 2240  NA 135  K 5.1  CL 94*  CO2 25  GLUCOSE 249*  BUN 65*  CREATININE 5.37*  CALCIUM 8.4*    No results for input(s): AST, ALT, ALKPHOS, BILITOT, PROT, ALBUMIN in the last 168 hours. No results for input(s): LIPASE, AMYLASE in the last 168 hours. No results for input(s): AMMONIA in the last 168 hours.    HbA1C: No results for input(s): HGBA1C in the last 72 hours. CBG: No results for input(s): GLUCAP in the last 168 hours.    Urine analysis:    Component Value Date/Time   COLORURINE STRAW (A) 07/08/2016 1148   APPEARANCEUR CLEAR 07/08/2016 1148   LABSPEC 1.020 07/08/2016 1148   PHURINE 6.0 07/08/2016 1148   GLUCOSEU >=500 (A) 07/08/2016 1148   HGBUR NEGATIVE 07/08/2016 1148   BILIRUBINUR NEGATIVE 07/08/2016 1148   KETONESUR NEGATIVE 07/08/2016 1148   PROTEINUR >=300 (A) 07/08/2016 1148   UROBILINOGEN 0.2 02/20/2015 2223   NITRITE NEGATIVE 07/08/2016 1148   LEUKOCYTESUR NEGATIVE 07/08/2016 1148       Cultures: No results found for: SDES, SPECREQUEST, CULT, REPTSTATUS   Radiological Exams on Admission: Dg Lumbar Spine Complete  Result Date: 08/29/2017 CLINICAL DATA:  Recent fall with low back pain, initial encounter EXAM: LUMBAR SPINE - COMPLETE 4+ VIEW COMPARISON:  04/18/2006 FINDINGS: Five lumbar type vertebral bodies are well visualized. Vertebral body height is well maintained. No pars defects are seen. No compression deformity is noted. No anterolisthesis is seen. No definitive soft tissue abnormality is noted. The known pubic rami fractures are not well appreciated on this exam. IMPRESSION: No acute abnormality in the lumbar spine. Electronically Signed   By: Alcide Clever M.D.   On: 08/29/2017 22:30   Dg Hips Bilat With Pelvis 2v  Result Date: 08/29/2017 CLINICAL DATA:  Recent  fall with hip pain, initial encounter EXAM: DG HIP (WITH OR WITHOUT PELVIS) 2V BILAT COMPARISON:  None. FINDINGS: The proximal femurs are within normal limits. There is a fracture through the superior pubic ramus on the left with only minimal displacement. Mild irregularity of the inferior pubic ramus on the left is noted as well consistent with an undisplaced fracture. No other definitive fracture is seen. No soft tissue changes are noted. IMPRESSION: Fractures through the superior and inferior pubic rami on the left. Electronically Signed   By: Alcide Clever M.D.   On: 08/29/2017 22:29    Chart has been reviewed    Assessment/Plan   67 y.o. female with medical history significant of ESRD on HD, DM 2 anemia GERD,'s history of CVA  Admitted for left superior and inferior rami fracture secondary to mechanical fall  Present on Admission:  . Pubic ramus fracture, left, closed, initial encounter [facility]) admit for pain management physical therapy when able to tolerate, will likely need placement . ESRD (end stage renal disease) (HCC) we will need to notify nephrology the patient likely will require hemodialysis given that she was unable to get her last treatment continue to monitor potassium currently no emergent indication for hemodialysis tonight   . GERD (gastroesophageal reflux disease) stable continue home medications  DM 2 -  - Order Sensitive   SSI     -  check TSH and HgA1C  - Hold by mouth medications   Cough -  will obtain CXR given risk factors  Other plan as per orders.  DVT prophylaxis:  SCD      Code Status:  FULL CODE as per patient   I had personally discussed CODE STATUS with patient    Family Communication:   Family not at  Bedside   Disposition Plan:    likely will need placement for rehabilitation                                              Would benefit from PT/OT eval prior to DC when able to tolerate                       Social Work  Nutrition                            Consults called: none   Admission status:    inpatient       Level of care     tele            Therisa Doyne 08/30/2017, 1:09 AM   [facility]  Pager [phone]   after 2 AM please page floor coverage PA If 7AM-7PM, please contact the day team taking care of the patient  [email or web address]  Password [email or web address]

## 2017-08-30 NOTE — ED Notes (Signed)
Ordered food tray for patient.  

## 2017-08-30 NOTE — ED Notes (Signed)
Paged Doctor regarding diet order.

## 2017-08-30 NOTE — ED Notes (Signed)
Dialysis called and will send for patient report given to dialysis nurse.

## 2017-08-30 NOTE — Progress Notes (Signed)
PROGRESS NOTE    Frances Reeves  WUJ:[id number] DOB: 1950-06-27 DOA: 08/29/2017 PCP: Patient, No Pcp Per   Brief Narrative: Frances Reeves is a 67 y.o. female with medical history significant of ESRD on HD, DM 2 anemia GERD,'s history of CVA    Presented with fall coming from home occurring Saturday, May 11 at 1 PM.  Patient fell down on her buttocks it was a mechanical fall. She was trying to reach up and fell backwards.  Patient initially thought she was okay and called her friend to let her know she was fine but later in the evening the friend has came over and found that the patient was sitting down the chair crying because of significant bilateral hip pain and unable to move. At baseline patient needs a cane or walker to ambulate. He was due for hemodialysis today but missed it last HD was Thursday. Reports poor appetite cough for the  Past 1 week, no chest pain,  No current shortness of breath, no fever. No appetite  X ray; Noted to have left superior and inferior pubic rami fractures     Assessment & Plan:   Active Problems:   DM (diabetes mellitus) (HCC)   ESRD (end stage renal disease) (HCC)   GERD (gastroesophageal reflux disease)   Anemia   History of CVA (cerebrovascular accident)   Noncompliance of patient with renal dialysis (HCC)   Pubic ramus fracture, left, closed, initial encounter (HCC)   Chronic diastolic CHF (congestive heart failure) (HCC)   Pubic ramus fracture (HCC)   1-Pubic Ramus fracture left closed.  Pain management.  PT. Weight bearing as tolerated.  Ortho recommended medical management. Pain controlled.    2-ESRD, on HD T, Thus, Sat.  No HD since Tuesday.  Nephrology consulted. Plan for HD today.    3-Dysphagia; will check esophagogram.   4-DM; SSI.    DVT prophylaxis: lovenox.  Code Status: full code.  Family Communication: care discussed with patient  Disposition Plan: might need snf   Consultants:    Ortho  Nephrology    Procedures:  none   Antimicrobials:   none   Subjective: Complaints of pelvis pain.  Also has been having problems with swallowing. Get shock with liquid and regular food at times.   Objective: Vitals:   08/30/17 1030 08/30/17 1100 08/30/17 1130 08/30/17 1200  BP: (!) 134/59 (!) 122/54 (!) 113/51 (!) 108/47  Pulse: 75 75 73 73  Resp: 16     Temp:      TempSrc:      SpO2: 99% 100% 98% 96%  Weight:      Height:       No intake or output data in the 24 hours ending 08/30/17 1509 Filed Weights   08/29/17 2157  Weight: 44.6 kg (98 lb 5.2 oz)    Examination:  General exam: Appears calm and comfortable, thin appearing.  Respiratory system: Clear to auscultation. Respiratory effort normal. Cardiovascular system: S1 & S2 heard, RRR. No JVD, murmurs, rubs, gallops or clicks. No pedal edema. Gastrointestinal system: Abdomen is nondistended, soft and nontender. No organomegaly or masses felt. Normal bowel sounds heard. Central nervous system: Alert and oriented. No focal neurological deficits. Extremities: Symmetric 5 x 5 power. Skin: No rashes, lesions or ulcers    Data Reviewed: I have personally reviewed following labs and imaging studies  CBC: Recent Labs  Lab 08/29/17 2240 08/30/17 0401  WBC 6.0 6.6  NEUTROABS 4.4  --   HGB 13.5 12.8  HCT 40.5 39.4  MCV 92.3 92.7  PLT 197 177   Basic Metabolic Panel: Recent Labs  Lab 08/29/17 2240 08/30/17 0401  NA 135 137  K 5.1 5.7*  CL 94* 97*  CO2 25 26  GLUCOSE 249* 321*  BUN 65* 68*  CREATININE 5.37* 5.55*  CALCIUM 8.4* 8.3*   GFR: Estimated Creatinine Clearance: 6.2 mL/min (A) (by C-G formula based on SCr of 5.55 mg/dL (H)). Liver Function Tests: Recent Labs  Lab 08/30/17 0401  ALBUMIN 3.2*   No results for input(s): LIPASE, AMYLASE in the last 168 hours. No results for input(s): AMMONIA in the last 168 hours. Coagulation Profile: No results for input(s): INR, PROTIME in  the last 168 hours. Cardiac Enzymes: No results for input(s): CKTOTAL, CKMB, CKMBINDEX, TROPONINI in the last 168 hours. BNP (last 3 results) No results for input(s): PROBNP in the last 8760 hours. HbA1C: Recent Labs    08/30/17 0401  HGBA1C 10.2*   CBG: Recent Labs  Lab 08/30/17 0851 08/30/17 1108  GLUCAP 254* 98   Lipid Profile: No results for input(s): CHOL, HDL, LDLCALC, TRIG, CHOLHDL, LDLDIRECT in the last 72 hours. Thyroid Function Tests: No results for input(s): TSH, T4TOTAL, FREET4, T3FREE, THYROIDAB in the last 72 hours. Anemia Panel: No results for input(s): VITAMINB12, FOLATE, FERRITIN, TIBC, IRON, RETICCTPCT in the last 72 hours. Sepsis Labs: No results for input(s): PROCALCITON, LATICACIDVEN in the last 168 hours.  No results found for this or any previous visit (from the past 240 hour(s)).       Radiology Studies: Dg Lumbar Spine Complete  Result Date: 08/29/2017 CLINICAL DATA:  Recent fall with low back pain, initial encounter EXAM: LUMBAR SPINE - COMPLETE 4+ VIEW COMPARISON:  04/18/2006 FINDINGS: Five lumbar type vertebral bodies are well visualized. Vertebral body height is well maintained. No pars defects are seen. No compression deformity is noted. No anterolisthesis is seen. No definitive soft tissue abnormality is noted. The known pubic rami fractures are not well appreciated on this exam. IMPRESSION: No acute abnormality in the lumbar spine. Electronically Signed   By: Alcide Clever M.D.   On: 08/29/2017 22:30   Dg Chest Port 1 View  Result Date: 08/30/2017 CLINICAL DATA:  Patient fell around 1300 hours today. Bilateral hip and back pain. EXAM: PORTABLE CHEST 1 VIEW COMPARISON:  05/13/2017 FINDINGS: Cardiomegaly with aortic atherosclerosis unchanged in appearance. Subsegmental atelectasis and/or scarring is seen along the periphery of the left lung base and to a lesser degree the right lower lobe. Clearing of bilateral pleural effusions since prior study.  Chronic interstitial lung disease without of bili consolidation or CHF. 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 No acute displaced rib fracture. IMPRESSION: Stable cardiomegaly with aortic atherosclerosis. Chronic interstitial disease with subsegmental atelectasis and/or scarring within both lower lobes, left greater than right. Electronically Signed   By: Tollie Eth M.D.   On: 08/30/2017 01:50   Dg Hips Bilat With Pelvis 2v  Result Date: 08/29/2017 CLINICAL DATA:  Recent fall with hip pain, initial encounter EXAM: DG HIP (WITH OR WITHOUT PELVIS) 2V BILAT COMPARISON:  None. FINDINGS: The proximal femurs are within normal limits. There is a fracture through the superior pubic ramus on the left with only minimal displacement. Mild irregularity of the inferior pubic ramus on the left is noted as well consistent with an undisplaced fracture. No other definitive fracture is seen. No soft tissue changes are noted. IMPRESSION: Fractures through the superior and inferior pubic rami on the left.  Electronically Signed   By: Alcide Clever M.D.   On: 08/29/2017 22:29        Scheduled Meds: . [START ON 09/01/2017] doxercalciferol  3 mcg Intravenous Q T,Th,Sa-HD  . insulin aspart  0-5 Units Subcutaneous QHS  . insulin aspart  0-9 Units Subcutaneous TID WC  . multivitamin  1 tablet Oral QHS  . senna  1 tablet Oral BID   Continuous Infusions: . methocarbamol (ROBAXIN)  IV       LOS: 0 days    Time spent: 35 minutes.     Alba Cory, MD [facility] Pager [phone]  If 7PM-7AM, please contact night-coverage [email or web address] Password [facility] 08/30/2017, 3:09 PM

## 2017-08-30 NOTE — Progress Notes (Signed)
Per nursing pain is not yet controlled. The patient is at an 8/10 pain without movement. The patient will be transferred up to the floors at some point today. Therapy will follow up as able.

## 2017-08-30 NOTE — ED Notes (Signed)
Pt in dialysis. Dialysis RN aware.

## 2017-08-30 NOTE — Progress Notes (Signed)
Patient ID: Frances Reeves, female   DOB: 12/07/1950, 67 y.o.   MRN: [id number] I have reviewed the patient's x-rays.  From a orthopedic standpoint, she has left-sided superior and inferior pubic rami fractures.  This type of fracture can be treated conservatively with mobility and weightbearing as the patient tolerates and as comfort allows.  No further treatment is needed from orthopedic standpoint other than therapy for mobility.  Please call with any questions or concerns.

## 2017-08-31 ENCOUNTER — Inpatient Hospital Stay (HOSPITAL_COMMUNITY): Payer: Medicare Other

## 2017-08-31 ENCOUNTER — Other Ambulatory Visit: Payer: Self-pay

## 2017-08-31 DIAGNOSIS — S32592A Other specified fracture of left pubis, initial encounter for closed fracture: Secondary | ICD-10-CM | POA: Diagnosis not present

## 2017-08-31 LAB — GLUCOSE, CAPILLARY
GLUCOSE-CAPILLARY: 199 mg/dL — AB (ref 65–99)
Glucose-Capillary: 212 mg/dL — ABNORMAL HIGH (ref 65–99)
Glucose-Capillary: 256 mg/dL — ABNORMAL HIGH (ref 65–99)
Glucose-Capillary: 198 mg/dL — ABNORMAL HIGH (ref 65–99)

## 2017-08-31 LAB — MRSA PCR SCREENING: MRSA by PCR: NEGATIVE

## 2017-08-31 LAB — VITAMIN D 25 HYDROXY (VIT D DEFICIENCY, FRACTURES): VIT D 25 HYDROXY: 29.3 ng/mL — AB (ref 30.0–100.0)

## 2017-08-31 MED ORDER — CALCIUM ACETATE (PHOS BINDER) 667 MG PO CAPS
667.0000 mg | ORAL_CAPSULE | Freq: Three times a day (TID) | ORAL | Status: DC
  Administered 2017-09-01 – 2017-09-04 (×10): 667 mg via ORAL
  Filled 2017-08-31 (×12): qty 1

## 2017-08-31 MED ORDER — ONDANSETRON HCL 4 MG/2ML IJ SOLN
4.0000 mg | Freq: Three times a day (TID) | INTRAMUSCULAR | Status: DC
  Filled 2017-08-31: qty 2

## 2017-08-31 MED ORDER — PANTOPRAZOLE SODIUM 40 MG PO TBEC
40.0000 mg | DELAYED_RELEASE_TABLET | Freq: Two times a day (BID) | ORAL | Status: DC
  Administered 2017-08-31 – 2017-09-04 (×7): 40 mg via ORAL
  Filled 2017-08-31 (×7): qty 1

## 2017-08-31 MED ORDER — ONDANSETRON HCL 4 MG/2ML IJ SOLN
4.0000 mg | Freq: Three times a day (TID) | INTRAMUSCULAR | Status: DC | PRN
  Administered 2017-08-31 – 2017-09-03 (×4): 4 mg via INTRAVENOUS
  Filled 2017-08-31 (×3): qty 2

## 2017-08-31 MED ORDER — NEPRO/CARBSTEADY PO LIQD
237.0000 mL | Freq: Two times a day (BID) | ORAL | Status: DC
  Administered 2017-09-01 – 2017-09-04 (×6): 237 mL via ORAL
  Filled 2017-08-31 (×12): qty 237

## 2017-08-31 NOTE — Evaluation (Signed)
Physical Therapy Evaluation Patient Details Name: Frances Reeves MRN: [id number] DOB: 27-Aug-1950 Today's Date: 08/31/2017   History of Present Illness  Pt is a 67 y.o. female presenting following fall at home. Pt found to have L superior and inferior pubic rami fractures. PMHx: CKD on HD, DM, GERD, CVA.  Clinical Impression  Pt admitted with above diagnosis. Pt currently with functional limitations due to the deficits listed below (see PT Problem List). Overall painful L hip and groin with movement; Excellent participation despite pain, and I anticipate good progress at sNF for post-acute Rehab;  Pt will benefit from skilled PT to increase their independence and safety with mobility to allow discharge to the venue listed below.    Hoover Brunette, Spanish interpreter joined [place] this session to facilitate communication.     Follow Up Recommendations SNF    Equipment Recommendations  Rolling walker with 5" wheels;3in1 (PT)(youth-sized)    Recommendations for Other Services       Precautions / Restrictions Precautions Precautions: Fall Restrictions Weight Bearing Restrictions: Yes RLE Weight Bearing: Weight bearing as tolerated LLE Weight Bearing: Weight bearing as tolerated      Mobility  Bed Mobility Overal bed mobility: Needs Assistance Bed Mobility: Supine to Sit     Supine to sit: Mod assist     General bed mobility comments: Cues for technique, HHA for trunk elevation to sitting and assist for LLE to EOB. Use of bed rail to assist with movement  Transfers Overall transfer level: Needs assistance Equipment used: Rolling walker (2 wheeled) Transfers: Sit to/from [employer] Sit to Stand: Mod assist;+2 physical assistance Stand pivot transfers: Mod assist;+2 physical assistance       General transfer comment: Cues for hand placement and technique. Mod assist +2 to boost up from EOB and pivot going toward L side.  Ambulation/Gait Ambulation/Gait  assistance: +2 safety/equipment;Mod assist Ambulation Distance (Feet): (pivot steps bed to recliner) Assistive device: Rolling walker (2 wheeled) Gait Pattern/deviations: Shuffle;Decreased stance time - left     General Gait Details: Painful to bear weight on LLE, dependent on RW for UE support; Decr tol of stance LLE, leading to very short steps, and difficulty with RLE advancement  Stairs            Wheelchair Mobility    Modified Rankin (Stroke Patients Only)       Balance Overall balance assessment: Needs assistance Sitting-balance support: Feet supported Sitting balance-Leahy Scale: Good     Standing balance support: Bilateral upper extremity supported Standing balance-Leahy Scale: Poor                               Pertinent Vitals/Pain Pain Assessment: 0-10 Pain Score: 8  Pain Location: L hip/groin Pain Descriptors / Indicators: Discomfort;Sore Pain Intervention(s): Monitored during session;Premedicated before session;Repositioned    Home Living Family/patient expects to be discharged to:: Private residence Living Arrangements: Alone Available Help at Discharge: Family;Available PRN/intermittently Type of Home: Apartment Home Access: Ramped entrance     Home Layout: One level Home Equipment: Cane - quad;Cane - single point;Grab bars - tub/shower      Prior Function Level of Independence: Independent with assistive device(s)         Comments: Cane for mobility on HD days, reports hx of falls in the shower     Hand Dominance        Extremity/Trunk Assessment   Upper Extremity Assessment Upper Extremity Assessment: Defer to OT  evaluation    Lower Extremity Assessment Lower Extremity Assessment: RLE deficits/detail;LLE deficits/detail RLE Deficits / Details: Hesitant to move RLE in all planes with anticipation of pain; Still, able to move through AROM R hip and knee slowly and painfully LLE Deficits / Details: Pain with movement  of hip in all planes of motion; unable to bear much weight through LLE  LLE: Unable to fully assess due to pain       Communication   Communication: No difficulties;Prefers language other than English;Interpreter utilized  Cognition Arousal/Alertness: Awake/alert Behavior During Therapy: WFL for tasks assessed/performed Overall Cognitive Status: Within Functional Limits for tasks assessed                                        General Comments General comments (skin integrity, edema, etc.): SpO2 95% on RA following activity; kept supplemental O2 off at end of session    Exercises     Assessment/Plan    PT Assessment Patient needs continued PT services  PT Problem List Decreased strength;Decreased range of motion;Decreased activity tolerance;Decreased balance;Decreased mobility;Decreased knowledge of use of DME;Decreased knowledge of precautions;Pain       PT Treatment Interventions DME instruction;Gait training;Functional mobility training;Therapeutic activities;Therapeutic exercise;Patient/family education;Balance training    PT Goals (Current goals can be found in the Care Plan section)  Acute Rehab PT Goals Patient Stated Goal: decrease pain, get better PT Goal Formulation: With patient Time For Goal Achievement: 09/14/17 Potential to Achieve Goals: Good    Frequency Min 3X/week   Barriers to discharge Decreased caregiver support      Co-evaluation PT/OT/SLP Co-Evaluation/Treatment: Yes Reason for Co-Treatment: For patient/therapist safety PT goals addressed during session: Mobility/safety with mobility OT goals addressed during session: ADL's and self-care       AM-PAC PT "6 Clicks" Daily Activity  Outcome Measure Difficulty turning over in bed (including adjusting bedclothes, sheets and blankets)?: Unable Difficulty moving from lying on back to sitting on the side of the bed? : Unable Difficulty sitting down on and standing up from a chair  with arms (e.g., wheelchair, bedside commode, etc,.)?: Unable Help needed moving to and from a bed to chair (including a wheelchair)?: A Lot Help needed walking in hospital room?: A Lot Help needed climbing 3-5 steps with a railing? : Total 6 Click Score: 8    End of Session Equipment Utilized During Treatment: Gait belt Activity Tolerance: Patient tolerated treatment well Patient left: in chair;with call bell/phone within reach;with chair alarm set Nurse Communication: Mobility status PT Visit Diagnosis: Other abnormalities of gait and mobility (R26.89);History of falling (Z91.81);Pain Pain - Right/Left: Left Pain - part of body: Hip    Time: 4098-1191 PT Time Calculation (min) (ACUTE ONLY): 36 min   Charges:   PT Evaluation $PT Eval Moderate Complexity: 1 Mod     PT G Codes:        Van Clines, PT  Acute Rehabilitation Services Pager [phone] Office [phone]   Levi Aland 08/31/2017, 4:16 PM

## 2017-08-31 NOTE — Progress Notes (Signed)
PROGRESS NOTE    Frances Reeves  WUJ:[id number] DOB: 1950/04/24 DOA: 08/29/2017 PCP: Patient, No Pcp Per   Brief Narrative: Frances Reeves is a 67 y.o. female with medical history significant of ESRD on HD, DM 2 anemia GERD,'s history of CVA    Presented with fall coming from home occurring Saturday, May 11 at 1 PM.  Patient fell down on her buttocks it was a mechanical fall. She was trying to reach up and fell backwards.  Patient initially thought she was okay and called her friend to let her know she was fine but later in the evening the friend has came over and found that the patient was sitting down the chair crying because of significant bilateral hip pain and unable to move. At baseline patient needs a cane or walker to ambulate. He was due for hemodialysis today but missed it last HD was Thursday. Reports poor appetite cough for the  Past 1 week, no chest pain,  No current shortness of breath, no fever. No appetite  X ray; Noted to have left superior and inferior pubic rami fractures  Assessment & Plan:   Active Problems:   DM (diabetes mellitus) (HCC)   ESRD (end stage renal disease) (HCC)   GERD (gastroesophageal reflux disease)   Anemia   History of CVA (cerebrovascular accident)   Noncompliance of patient with renal dialysis (HCC)   Pubic ramus fracture, left, closed, initial encounter (HCC)   Chronic diastolic CHF (congestive heart failure) (HCC)   Pubic ramus fracture (HCC)   1-Pubic Ramus fracture left closed.  Pain management.  PT. Weight bearing as tolerated.  Ortho recommended medical management. Pain controlled.  Might need SNF  2-ESRD, on HD T, Thus, Sat.  No HD since Tuesday.  Nephrology consulted. Had HD 5-12   3-Dysphagia; esophagogram; tertiary contraction esophagus, esophagitis. Not accurate due to inability to stand up.  Start PPI and will likely need repeat esophagogram when she is able to stands up.   4-DM; SSI.    DVT  prophylaxis: lovenox.  Code Status: full code.  Family Communication: care discussed with patient  Disposition Plan: might need snf   Consultants:   Ortho  Nephrology    Procedures:  none   Antimicrobials:   none   Subjective: Still with pelvis pain. Drinking ensure.   Objective: Vitals:   08/30/17 2001 08/30/17 2003 08/30/17 2104 08/31/17 0604  BP: (!) 135/58 (!) 148/66 (!) 162/61 (!) 127/54  Pulse: 77 79 81 76  Resp: Temp: 98.1 F (36.7 C)  99 F (37.2 C) 98.3 F (36.8 C)  TempSrc:   Oral Oral  SpO2:   100% 99%  Weight: 43.1 kg (95 lb 0.3 oz)     Height:        Intake/Output Summary (Last 24 hours) at 08/31/2017 0834 Last data filed at 08/31/2017 0651 Gross per 24 hour  Intake 180 ml  Output 1500 ml  Net -1320 ml   Filed Weights   08/29/17 2157 08/30/17 1601 08/30/17 2001  Weight: 44.6 kg (98 lb 5.2 oz) 44.6 kg (98 lb 5.2 oz) 43.1 kg (95 lb 0.3 oz)    Examination:  General exam: NAD Respiratory system: CTA Cardiovascular system: S 1, S 2 RRR Gastrointestinal system: BS present, soft, nt Central nervous system: alert and oriented.  Extremities: symmetric power.  Skin: No rashes, lesions or ulcers    Data Reviewed: I have personally reviewed following labs and imaging studies  CBC:  Recent Labs  Lab 08/29/17 2240 08/30/17 0401  WBC 6.0 6.6  NEUTROABS 4.4  --   HGB 13.5 12.8  HCT 40.5 39.4  MCV 92.3 92.7  PLT 197 177   Basic Metabolic Panel: Recent Labs  Lab 08/29/17 2240 08/30/17 0401  NA 135 137  K 5.1 5.7*  CL 94* 97*  CO2 25 26  GLUCOSE 249* 321*  BUN 65* 68*  CREATININE 5.37* 5.55*  CALCIUM 8.4* 8.3*   GFR: Estimated Creatinine Clearance: 6.2 mL/min (A) (by C-G formula based on SCr of 5.55 mg/dL (H)). Liver Function Tests: Recent Labs  Lab 08/30/17 0401  ALBUMIN 3.2*   No results for input(s): LIPASE, AMYLASE in the last 168 hours. No results for input(s): AMMONIA in the last 168 hours. Coagulation  Profile: No results for input(s): INR, PROTIME in the last 168 hours. Cardiac Enzymes: No results for input(s): CKTOTAL, CKMB, CKMBINDEX, TROPONINI in the last 168 hours. BNP (last 3 results) No results for input(s): PROBNP in the last 8760 hours. HbA1C: Recent Labs    08/30/17 0401  HGBA1C 10.2*   CBG: Recent Labs  Lab 08/30/17 0851 08/30/17 1108 08/30/17 2200  GLUCAP 254* 98 194*   Lipid Profile: No results for input(s): CHOL, HDL, LDLCALC, TRIG, CHOLHDL, LDLDIRECT in the last 72 hours. Thyroid Function Tests: No results for input(s): TSH, T4TOTAL, FREET4, T3FREE, THYROIDAB in the last 72 hours. Anemia Panel: No results for input(s): VITAMINB12, FOLATE, FERRITIN, TIBC, IRON, RETICCTPCT in the last 72 hours. Sepsis Labs: No results for input(s): PROCALCITON, LATICACIDVEN in the last 168 hours.  Recent Results (from the past 240 hour(s))  MRSA PCR Screening     Status: None   Collection Time: 08/30/17 11:30 PM  Result Value Ref Range Status   MRSA by PCR NEGATIVE NEGATIVE Final    Comment:        The GeneXpert MRSA Assay (FDA approved for NASAL specimens only), is one component of a comprehensive MRSA colonization surveillance program. It is not intended to diagnose MRSA infection nor to guide or monitor treatment for MRSA infections. Performed at [facility], [address] [place], [place] [address]          Radiology Studies: Dg Lumbar Spine Complete  Result Date: 08/29/2017 CLINICAL DATA:  Recent fall with low back pain, initial encounter EXAM: LUMBAR SPINE - COMPLETE 4+ VIEW COMPARISON:  04/18/2006 FINDINGS: Five lumbar type vertebral bodies are well visualized. Vertebral body height is well maintained. No pars defects are seen. No compression deformity is noted. No anterolisthesis is seen. No definitive soft tissue abnormality is noted. The known pubic rami fractures are not well appreciated on this exam. IMPRESSION: No acute abnormality in the  lumbar spine. Electronically Signed   By: Alcide Clever M.D.   On: 08/29/2017 22:30   Dg Chest Port 1 View  Result Date: 08/30/2017 CLINICAL DATA:  Patient fell around 1300 hours today. Bilateral hip and back pain. EXAM: PORTABLE CHEST 1 VIEW COMPARISON:  05/13/2017 FINDINGS: Cardiomegaly with aortic atherosclerosis unchanged in appearance. Subsegmental atelectasis and/or scarring is seen along the periphery of the left lung base and to a lesser degree the right lower lobe. Clearing of bilateral pleural effusions since prior study. Chronic interstitial lung disease without of bili consolidation or CHF. 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 No acute displaced rib fracture. IMPRESSION: Stable cardiomegaly with aortic atherosclerosis. Chronic interstitial disease with subsegmental atelectasis and/or scarring within both lower lobes, left greater than right. Electronically  Signed   By: Tollie Eth M.D.   On: 08/30/2017 01:50   Dg Hips Bilat With Pelvis 2v  Result Date: 08/29/2017 CLINICAL DATA:  Recent fall with hip pain, initial encounter EXAM: DG HIP (WITH OR WITHOUT PELVIS) 2V BILAT COMPARISON:  None. FINDINGS: The proximal femurs are within normal limits. There is a fracture through the superior pubic ramus on the left with only minimal displacement. Mild irregularity of the inferior pubic ramus on the left is noted as well consistent with an undisplaced fracture. No other definitive fracture is seen. No soft tissue changes are noted. IMPRESSION: Fractures through the superior and inferior pubic rami on the left. Electronically Signed   By: Alcide Clever M.D.   On: 08/29/2017 22:29        Scheduled Meds: . [START ON 09/01/2017] doxercalciferol  3 mcg Intravenous Q T,Th,Sa-HD  . insulin aspart  0-5 Units Subcutaneous QHS  . insulin aspart  0-9 Units Subcutaneous TID WC  . multivitamin  1 tablet Oral QHS  . senna  1 tablet Oral BID   Continuous Infusions: . methocarbamol  (ROBAXIN)  IV       LOS: 1 day    Time spent: 35 minutes.     Frances Cory, MD [facility] Pager [phone]  If 7PM-7AM, please contact night-coverage [email or web address] Password [facility] 08/31/2017, 8:34 AM

## 2017-08-31 NOTE — Progress Notes (Addendum)
[facility] Progress Note   Subjective: Non-English speaking pt. PT getting interpreter. No C/Os. Smiling, throwing kisses. Denies pain.   Objective Vitals:   08/30/17 2001 08/30/17 2003 08/30/17 2104 08/31/17 0604  BP: (!) 135/58 (!) 148/66 (!) 162/61 (!) 127/54  Pulse: 77 79 81 76  Resp: Temp: 98.1 F (36.7 C)  99 F (37.2 C) 98.3 F (36.8 C)  TempSrc:   Oral Oral  SpO2:   100% 99%  Weight: 43.1 kg (95 lb 0.3 oz)     Height:       Physical Exam General: Thin, pleasant female in NAD Heart: S1,S2, RRR Lungs: CTAB. No WOB.  Abdomen: Active BS Extremities: No LE edema.  Dialysis Access: LUA AVF + bruit  Additional Objective Labs: Basic Metabolic Panel: Recent Labs  Lab 08/29/17 2240 08/30/17 0401  NA 135 137  K 5.1 5.7*  CL 94* 97*  CO2 25 26  GLUCOSE 249* 321*  BUN 65* 68*  CREATININE 5.37* 5.55*  CALCIUM 8.4* 8.3*   Liver Function Tests: Recent Labs  Lab 08/30/17 0401  ALBUMIN 3.2*  CBC: Recent Labs  Lab 08/29/17 2240 08/30/17 0401  WBC 6.0 6.6  NEUTROABS 4.4  --   HGB 13.5 12.8  HCT 40.5 39.4  MCV 92.3 92.7  PLT 197 177   CBG: Recent Labs  Lab 08/30/17 0851 08/30/17 1108 08/30/17 2200  GLUCAP 254* 98 194*   I Studies/Results: Dg Lumbar Spine Complete  Result Date: 08/29/2017 CLINICAL DATA:  Recent fall with low back pain, initial encounter EXAM: LUMBAR SPINE - COMPLETE 4+ VIEW COMPARISON:  04/18/2006 FINDINGS: Five lumbar type vertebral bodies are well visualized. Vertebral body height is well maintained. No pars defects are seen. No compression deformity is noted. No anterolisthesis is seen. No definitive soft tissue abnormality is noted. The known pubic rami fractures are not well appreciated on this exam. IMPRESSION: No acute abnormality in the lumbar spine. Electronically Signed   By: Alcide Clever M.D.   On: 08/29/2017 22:30   Dg Esophagus  Result Date: 08/31/2017 CLINICAL DATA:  Dysphagia, difficulty  swallowing food and liquids. EXAM: ESOPHOGRAM/BARIUM SWALLOW TECHNIQUE: Single contrast examination was performed using  thin barium. FLUOROSCOPY TIME:  Fluoroscopy Time:  1 minutes, 24 seconds Radiation Exposure Index (if provided by the fluoroscopic device): 8.0 mGy Number of Acquired Spot Images: 0 COMPARISON:  None. FINDINGS: The patient is relatively frail and accordingly the standard esophagram with standing, turning, carefully timed double contrast swallowing, and extensive maneuvering on the table was not feasible. Instead, single swallows were performed with patient in the LPO position. There were occasional tertiary contractions in the esophagus as on image 10/3. Primary peristaltic waves in the esophagus were preserved on 3/4 swallows. As shown for example on image 19/1 and image 22/2, there is some slight narrowing of the thoracic esophagus distally which is smoothly marginated. This appears somewhat pulsatile, suggesting that the narrowing is probably due to esophageal compression between the enlarged heart and the spine. Mild smooth fold thickening in this segment of the esophagus is suggested on image 32/2. The patient swallowed a 13 mm barium pill which passed into the stomach without delay. Note is also made of cardiomegaly and atherosclerotic calcification of the aortic arch. IMPRESSION: 1. Smooth, likely extrinsic narrowing of the esophagus adjacent to the enlarged heart. This seems pulsatile and I suspect that the appearance is primarily due to the esophageal positioning between the enlarged heart and spine. Mild fold  thickening of the esophagus in this vicinity suggests mild-to-moderate esophagitis. 2. Scattered tertiary contractions in the esophagus but primary peristaltic waves were preserved on 3/4 swallows. 3. Please note that due to patient frailty the typical protocol for esophagram could not be performed, and today's images were obtained with the patient laying down in the LPO position.  This reduces the exams sensitivity and specificity compared to the normal comprehensive esophagram performed on patients able to stand, turn, and follow complex swallowing instructions. 4. Atherosclerotic calcification of the aortic arch. Electronically Signed   By: Gaylyn Rong M.D.   On: 08/31/2017 08:42   Dg Chest Port 1 View  Result Date: 08/30/2017 CLINICAL DATA:  Patient fell around 1300 hours today. Bilateral hip and back pain. EXAM: PORTABLE CHEST 1 VIEW COMPARISON:  05/13/2017 FINDINGS: Cardiomegaly with aortic atherosclerosis unchanged in appearance. Subsegmental atelectasis and/or scarring is seen along the periphery of the left lung base and to a lesser degree the right lower lobe. Clearing of bilateral pleural effusions since prior study. Chronic interstitial lung disease without of bili consolidation or CHF. 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 No acute displaced rib fracture. IMPRESSION: Stable cardiomegaly with aortic atherosclerosis. Chronic interstitial disease with subsegmental atelectasis and/or scarring within both lower lobes, left greater than right. Electronically Signed   By: Tollie Eth M.D.   On: 08/30/2017 01:50   Dg Hips Bilat With Pelvis 2v  Result Date: 08/29/2017 CLINICAL DATA:  Recent fall with hip pain, initial encounter EXAM: DG HIP (WITH OR WITHOUT PELVIS) 2V BILAT COMPARISON:  None. FINDINGS: The proximal femurs are within normal limits. There is a fracture through the superior pubic ramus on the left with only minimal displacement. Mild irregularity of the inferior pubic ramus on the left is noted as well consistent with an undisplaced fracture. No other definitive fracture is seen. No soft tissue changes are noted. IMPRESSION: Fractures through the superior and inferior pubic rami on the left. Electronically Signed   By: Alcide Clever M.D.   On: 08/29/2017 22:29   Medications: . methocarbamol (ROBAXIN)  IV     . [START ON 09/01/2017]  doxercalciferol  3 mcg Intravenous Q T,Th,Sa-HD  . insulin aspart  0-5 Units Subcutaneous QHS  . insulin aspart  0-9 Units Subcutaneous TID WC  . multivitamin  1 tablet Oral QHS  . senna  1 tablet Oral BID   HD orders: [facility] T,Th,S 4 hrs 180 NRe 400/Auto 1.5 43.5 kg 2.0 K/2.25 Ca  - Heparin 1400 units IV TIW -Hectorol 3 mcg IV TIW   Assessment/Plan: 1. L Superior and inferior pubic rami fxs: Per primary. Seen by ortho-recommending conservative therapy. Consider whether or not patient will be able to tolerate sitting in recliner/transport to and from HD. Working with PT.  2. ESRD -T,Th,S via LUA AVF. Missed HD 08/29/2017. Had HD 08/30/17 K+ 5.7. No heparin, renal profile prior to HD tomorrow.  3. Anemia - HGB 12.8 monitor. No OP ESA.  4. Secondary hyperparathyroidism -Continue binders, VDRA. Ca 8.3 C Ca 8.9   5. HTN/volume - Fair control of BP.  No antihypertensive meds on OP med list. HD 05/12 Pre wt 44.6 Net UF 1.5 Post wt 43.1. No evidence of volume overload by exam. Keep EDW as it is for now.  6. Nutrition - Albumin 3.2. Renal/Carb mod diet, nepro, renal vits 7. DM: per primary  Rita H. Brown NP-C 08/31/2017, 9:26 AM  [facility] [phone]  Pt seen, examined and agree w A/P  as above.  Vinson Moselle MD [employer] pager [phone]   08/31/2017, 1:39 PM

## 2017-08-31 NOTE — Care Management Note (Signed)
Case Management Note  Patient Details  Name: Frances Reeves MRN: [id number] Date of Birth: [birth year]  Subjective/Objective:  Admitted for ESRD.             Action/Plan: Prior to admission patient lived at home alone. At discharge once patient is medically stable for discharge and bed available plan to discharge to SNF, per CSW "Erie Noe" arrangements.   Expected Discharge Date:   To be determined               Expected Discharge Plan:  Skilled Nursing Facility  In-House Referral:  Clinical Social Work, Nutrition  Discharge planning Services  CM Consult  Status of Service:  In process, will continue to follow  Yancey Flemings, RN 08/31/2017, 3:02 PM

## 2017-08-31 NOTE — Progress Notes (Signed)
Inpatient Diabetes Program   AACE/ADA: New Consensus Statement on Inpatient Glycemic Control (2015)  Target Ranges:  Prepandial:   less than 140 mg/dL      Peak postprandial:   less than 180 mg/dL (1-2 hours)      Critically ill patients:  140 - 180 mg/dL    Spoke with patient through [facility] interpreter [facility] [phone]) about diabetes and home regimen for diabetes control. Patient reports that she has been on Glimepiride 4 mg since 1998. Patient also reports being on metformin at one time before she was placed on dialysis. Patient reports following a DM diet. Patient reports taking medication as prescribed, never missing a dose. Discussed A1C results 10.2% this admission. Discussed glucose and A1C goals. Discussed importance of checking CBGs and maintaining good CBG control to prevent long-term and short-term complications. Explained that each oral DM medication can lower the A1c 1-2% and that she may need additional medication. Explained how hyperglycemia leads to damage within blood vessels which lead to the common complications seen with uncontrolled diabetes.    Patient complained of dizziness and shakiness toward the end of our conversation. RN called. CBG in the 250's, BP 132/45. RN notified.  Thanks,  Christena Deem RN, MSN, [facility] Inpatient Diabetes Coordinator Team Pager [phone] (8a-5p)

## 2017-08-31 NOTE — Progress Notes (Signed)
New Admission Note: Late Entry  Arrival Method: By bed from HD around 2100 Mental Orientation: Alert and oriented, speaks only a few words in english, spanish-speaking, used interpreter Telemetry: CCMD notified, box 5M01 Assessment: Completed Skin: Completed, refer to flowsheets IV: Left AC S.L. Pain: Only when moves, just had pain meds in HD Tubes: None Safety Measures: Safety Fall Prevention Plan was given, discussed. Admission: Completed [facility] Orientation: Patient has been orientated to the room, unit and the staff. Family: None  Orders have been reviewed and implemented. Will continue to monitor the patient. Call light has been placed within reach and bed alarm has been activated.   Alfonse Ras, RN  Phone Number: [phone]

## 2017-08-31 NOTE — Evaluation (Signed)
Occupational Therapy Evaluation Patient Details Name: Frances Reeves MRN: [id number] DOB: January 05, 1951 Today's Date: 08/31/2017    History of Present Illness Pt is a 67 y.o. female presenting to ED after a fall resulting in L superior and inferior pubic rami fractures; PMH of ESRD HD TTS;  has a past medical history of Anemia, Chronic kidney disease, Diabetes mellitus without complication (HCC), GERD (gastroesophageal reflux disease), and Stroke (HCC).   Clinical Impression   Pt reports she was independent with ADL PTA but with hx of falls during ADL. Currently pt mod assist +2 for stand pivot transfer and max assist for LB ADL. Recommending SNF for follow up to maximize independence and safety with ADL and functional mobility prior to return home alone. Pt would benefit from continued skilled OT to address established goals.    Follow Up Recommendations  SNF    Equipment Recommendations  Other (comment)(TBD at next venue)    Recommendations for Other Services       Precautions / Restrictions Precautions Precautions: Fall Restrictions Weight Bearing Restrictions: Yes RLE Weight Bearing: Weight bearing as tolerated LLE Weight Bearing: Weight bearing as tolerated      Mobility Bed Mobility Overal bed mobility: Needs Assistance Bed Mobility: Supine to Sit     Supine to sit: Mod assist     General bed mobility comments: Cues for technique, HHA for trunk elevation to sitting and assist for LLE to EOB. Use of bed rail to assist with movement  Transfers Overall transfer level: Needs assistance Equipment used: Rolling walker (2 wheeled) Transfers: Sit to/from [employer] Sit to Stand: Mod assist;+2 physical assistance Stand pivot transfers: Mod assist;+2 physical assistance       General transfer comment: Cues for hand placement and technique. Mod assist +2 to boost up from EOB and pivot going toward L side.    Balance Overall balance assessment: Needs  assistance Sitting-balance support: Feet supported Sitting balance-Leahy Scale: Good     Standing balance support: Bilateral upper extremity supported Standing balance-Leahy Scale: Poor                             ADL either performed or assessed with clinical judgement   ADL Overall ADL's : Needs assistance/impaired Eating/Feeding: Set up;Sitting   Grooming: Set up;Supervision/safety;Sitting   Upper Body Bathing: Set up;Supervision/ safety;Sitting   Lower Body Bathing: Maximal assistance;Sit to/from stand;+2 for physical assistance   Upper Body Dressing : Set up;Supervision/safety;Sitting   Lower Body Dressing: Maximal assistance;+2 for physical assistance;Sit to/from stand   Toilet Transfer: Moderate assistance;Stand-pivot;+2 for physical assistance;BSC;RW Toilet Transfer Details (indicate cue type and reason): Simulated by stand pivot EOB>chair         Functional mobility during ADLs: Moderate assistance;+2 for physical assistance;Rolling walker(for stand pivot only)       Vision         Perception     Praxis      Pertinent Vitals/Pain Pain Assessment: 0-10 Pain Score: 8  Pain Location: L hip/groin Pain Descriptors / Indicators: Discomfort;Sore Pain Intervention(s): Monitored during session;Premedicated before session;Repositioned     Hand Dominance     Extremity/Trunk Assessment Upper Extremity Assessment Upper Extremity Assessment: Overall WFL for tasks assessed   Lower Extremity Assessment Lower Extremity Assessment: Defer to PT evaluation       Communication Communication Communication: No difficulties;Prefers language other than English;Interpreter utilized   Cognition Arousal/Alertness: Awake/alert Behavior During Therapy: WFL for tasks assessed/performed Overall Cognitive  Status: Within Functional Limits for tasks assessed                                     General Comments  SpO2 95% on RA following activity;  kept supplemental O2 off at end of session    Exercises     Shoulder Instructions      Home Living Family/patient expects to be discharged to:: Private residence Living Arrangements: Alone Available Help at Discharge: Family;Available PRN/intermittently Type of Home: Apartment Home Access: Ramped entrance     Home Layout: One level     Bathroom Shower/Tub: Chief Strategy Officer: Standard     Home Equipment: Cane - quad;Cane - single point;Grab bars - tub/shower          Prior Functioning/Environment Level of Independence: Independent with assistive device(s)        Comments: Cane for mobility on HD days, reports hx of falls in the shower        OT Problem List: Decreased activity tolerance;Impaired balance (sitting and/or standing);Decreased knowledge of use of DME or AE;Decreased knowledge of precautions;Pain      OT Treatment/Interventions: Self-care/ADL training;Therapeutic exercise;Energy conservation;DME and/or AE instruction;Therapeutic activities;Patient/family education;Balance training    OT Goals(Current goals can be found in the care plan section) Acute Rehab OT Goals Patient Stated Goal: decrease pain, get better OT Goal Formulation: With patient Time For Goal Achievement: 09/14/17 Potential to Achieve Goals: Good  OT Frequency: Min 2X/week   Barriers to D/C: Decreased caregiver support          Co-evaluation PT/OT/SLP Co-Evaluation/Treatment: Yes Reason for Co-Treatment: For patient/therapist safety PT goals addressed during session: Mobility/safety with mobility OT goals addressed during session: ADL's and self-care      AM-PAC PT "6 Clicks" Daily Activity     Outcome Measure Help from another person eating meals?: A Little Help from another person taking care of personal grooming?: A Little Help from another person toileting, which includes using toliet, bedpan, or urinal?: A Lot Help from another person bathing (including  washing, rinsing, drying)?: A Lot Help from another person to put on and taking off regular upper body clothing?: A Little Help from another person to put on and taking off regular lower body clothing?: A Lot 6 Click Score: 15   End of Session Equipment Utilized During Treatment: Gait belt;Rolling walker  Activity Tolerance: Patient tolerated treatment well Patient left: in chair;with call bell/phone within reach;with chair alarm set  OT Visit Diagnosis: Other abnormalities of gait and mobility (R26.89);Pain Pain - Right/Left: Left Pain - part of body: Hip                Time: 9604-5409 OT Time Calculation (min): 36 min Charges:  OT General Charges $OT Visit: 1 Visit OT Evaluation $OT Eval Moderate Complexity: 1 Mod G-Codes:     Brennon Otterness A. Brett Albino, M.S., OTR/L Acute Rehab Department: [phone]  Frances Reeves 08/31/2017, 1:27 PM

## 2017-08-31 NOTE — Progress Notes (Signed)
Initial Nutrition Assessment  DOCUMENTATION CODES:   Non-severe (moderate) malnutrition in context of chronic illness  INTERVENTION:    Nepro Shake po BID, each supplement provides 425 kcal and 19 grams protein  NUTRITION DIAGNOSIS:   Moderate Malnutrition related to chronic illness(ESRD) as evidenced by mild fat depletion, severe fat depletion, mild muscle depletion, moderate muscle depletion.  GOAL:   Patient will meet greater than or equal to 90% of their needs  MONITOR:   PO intake, Supplement acceptance  REASON FOR ASSESSMENT:   Malnutrition Screening Tool    ASSESSMENT:   67 yo female with PMH of DM, stroke, ESRD on HD, and GERD who was admitted on 5/11 S/P fall with L superior and inferior pubic rami fractures.  Patient c/o nausea currently. She has been eating poorly, consuming < 25% of meals since admission.  Labs reviewed. Potassium 5.7 (H), vitamin D, 25-hydroxy 29.3 (L) CBG's: [phone] Medications reviewed and include Rena-vit, Novolog, and Phoslo    NUTRITION - FOCUSED PHYSICAL EXAM:    Most Recent Value  Orbital Region  Mild depletion  Upper Arm Region  Mild depletion  Thoracic and Lumbar Region  Severe depletion  Buccal Region  No depletion  Temple Region  Mild depletion  Clavicle Bone Region  Moderate depletion  Clavicle and Acromion Bone Region  Moderate depletion  Scapular Bone Region  Mild depletion  Dorsal Hand  Moderate depletion  Patellar Region  No depletion  Anterior Thigh Region  Unable to assess  Posterior Calf Region  No depletion  Edema (RD Assessment)  Mild  Hair  Reviewed  Eyes  Reviewed  Mouth  Reviewed  Skin  Reviewed  Nails  Reviewed       Diet Order:   Diet Order           Diet renal/carb modified with fluid restriction Diet-HS Snack? Nothing; Fluid restriction: 1200 mL Fluid; Room service appropriate? Yes; Fluid consistency: Thin  Diet effective now          EDUCATION NEEDS:   No education needs have been  identified at this time  Skin:  Skin Assessment: Skin Integrity Issues: Skin Integrity Issues:: Other (Comment) Other: R leg black scab  Last BM:  unknown  Height:   Ht Readings from Last 1 Encounters:  08/29/17  (1.473 m)    Weight:   Wt Readings from Last 1 Encounters:  08/30/17 95 lb 0.3 oz (43.1 kg)    Ideal Body Weight:  43.9 kg  BMI:  Body mass index is 19.86 kg/m.  Estimated Nutritional Needs:   Kcal:  1400-1600  Protein:  60-70 gm  Fluid:  1.2 L    Joaquin Courts, RD, LDN, CNSC Pager [phone] After Hours Pager [phone]

## 2017-09-01 DIAGNOSIS — S32592A Other specified fracture of left pubis, initial encounter for closed fracture: Secondary | ICD-10-CM | POA: Diagnosis not present

## 2017-09-01 DIAGNOSIS — E440 Moderate protein-calorie malnutrition: Secondary | ICD-10-CM

## 2017-09-01 LAB — RENAL FUNCTION PANEL
ANION GAP: 15 (ref 5–15)
Albumin: 2.9 g/dL — ABNORMAL LOW (ref 3.5–5.0)
BUN: 54 mg/dL — ABNORMAL HIGH (ref 6–20)
CO2: 27 mmol/L (ref 22–32)
CREATININE: 4.67 mg/dL — AB (ref 0.44–1.00)
Calcium: 7.4 mg/dL — ABNORMAL LOW (ref 8.9–10.3)
Chloride: 89 mmol/L — ABNORMAL LOW (ref 101–111)
GFR calc Af Amer: 10 mL/min — ABNORMAL LOW (ref >60)
GFR calc non Af Amer: 9 mL/min — ABNORMAL LOW (ref >60)
GLUCOSE: 197 mg/dL — AB (ref 65–99)
Phosphorus: 6.7 mg/dL — ABNORMAL HIGH (ref 2.5–4.6)
Potassium: 4.8 mmol/L (ref 3.5–5.1)
SODIUM: 131 mmol/L — AB (ref 135–145)

## 2017-09-01 LAB — CBC
HEMATOCRIT: 35.5 % — AB (ref 36.0–46.0)
Hemoglobin: 11.6 g/dL — ABNORMAL LOW (ref 12.0–15.0)
MCH: 30.4 pg (ref 26.0–34.0)
MCHC: 32.7 g/dL (ref 30.0–36.0)
MCV: 92.9 fL (ref 78.0–100.0)
PLATELETS: 168 10*3/uL (ref 150–400)
RBC: 3.82 MIL/uL — AB (ref 3.87–5.11)
RDW: 15.5 % (ref 11.5–15.5)
WBC: 4.2 10*3/uL (ref 4.0–10.5)

## 2017-09-01 LAB — GLUCOSE, CAPILLARY
GLUCOSE-CAPILLARY: 136 mg/dL — AB (ref 65–99)
Glucose-Capillary: 204 mg/dL — ABNORMAL HIGH (ref 65–99)
Glucose-Capillary: 154 mg/dL — ABNORMAL HIGH (ref 65–99)

## 2017-09-01 MED ORDER — SORBITOL 70 % SOLN
30.0000 mL | Freq: Once | Status: AC
  Administered 2017-09-01: 30 mL via ORAL
  Filled 2017-09-01: qty 30

## 2017-09-01 NOTE — Progress Notes (Signed)
When discussing with the day shift nurse pt stated that she was getting nauseous with movement only, when getting pt up to pivot to [facility] she threw up again. She stated she feels dizzy but but it is not related to the pain.

## 2017-09-01 NOTE — Progress Notes (Signed)
Pt appears to have a hard time seeing, she does not grab things appropriately. Spoke with daughter about this and she states they have not been able to get her to the MD apt.

## 2017-09-01 NOTE — Progress Notes (Signed)
Physical Therapy Treatment Patient Details Name: Frances Reeves MRN: [id number] DOB: May 11, 1950 Today's Date: 09/01/2017    History of Present Illness Pt is a 67 y.o. female presenting following fall at home. Pt found to have L superior and inferior pubic rami fractures. PMHx: CKD on HD, DM, GERD, CVA.    PT Comments    Continuing work on functional mobility and activity tolerance;  Took bed mobility slower, and pt reports less pain with moving than yesterday; Dizzy and nauseated sitting EOB, BPs WNL; vomitted a minimal amount, RN aware; Still very motivated and participating beautifully despite pain and nausea; I anticipate good progress; [employer] interpreter, Helmut Muster [phone]) facilitated communication  Follow Up Recommendations  SNF     Equipment Recommendations  Rolling walker with 5" wheels;3in1 (PT)(youth-sized)    Recommendations for Other Services       Precautions / Restrictions Precautions Precautions: Fall Restrictions RLE Weight Bearing: Weight bearing as tolerated LLE Weight Bearing: Weight bearing as tolerated    Mobility  Bed Mobility Overal bed mobility: Needs Assistance Bed Mobility: Supine to Sit     Supine to sit: Mod assist     General bed mobility comments: Cues for technique, Light mod HHA for trunk elevation to sitting and assist for LLE to EOB. Use of bed rail to assist with movement  Transfers Overall transfer level: Needs assistance Equipment used: Rolling walker (2 wheeled) Transfers: Sit to/from [employer] Sit to Stand: Mod assist         General transfer comment: Light mod assist to power up; cues for hand placement; improved from yesterday  Ambulation/Gait Ambulation/Gait assistance: Mod assist Ambulation Distance (Feet): (pivot steps bed to chair) Assistive device: Rolling walker (2 wheeled) Gait Pattern/deviations: Shuffle;Decreased stance time - left     General Gait Details: Painful to bear weight on LLE,  dependent on RW for UE support; Decr tol of stance LLE, leading to very short steps, and difficulty with RLE advancement   Stairs             Wheelchair Mobility    Modified Rankin (Stroke Patients Only)       Balance     Sitting balance-Leahy Scale: Good     Standing balance support: Bilateral upper extremity supported Standing balance-Leahy Scale: Poor                              Cognition Arousal/Alertness: Awake/alert Behavior During Therapy: WFL for tasks assessed/performed Overall Cognitive Status: Within Functional Limits for tasks assessed                                        Exercises      General Comments General comments (skin integrity, edema, etc.): O2 sats 94-100% with mobility on Room Air      Pertinent Vitals/Pain Pain Assessment: 0-10 Pain Score: 8  Pain Location: L hip/groin Pain Descriptors / Indicators: Discomfort;Sore Pain Intervention(s): Monitored during session;Premedicated before session;Repositioned    Home Living                      Prior Function            PT Goals (current goals can now be found in the care plan section) Acute Rehab PT Goals Patient Stated Goal: decrease pain, get better PT Goal Formulation: With patient Time  For Goal Achievement: 09/14/17 Potential to Achieve Goals: Good Progress towards PT goals: Progressing toward goals    Frequency    Min 3X/week      PT Plan Current plan remains appropriate    Co-evaluation              AM-PAC PT "6 Clicks" Daily Activity  Outcome Measure  Difficulty turning over in bed (including adjusting bedclothes, sheets and blankets)?: A Lot Difficulty moving from lying on back to sitting on the side of the bed? : Unable Difficulty sitting down on and standing up from a chair with arms (e.g., wheelchair, bedside commode, etc,.)?: Unable Help needed moving to and from a bed to chair (including a wheelchair)?: A  Lot Help needed walking in hospital room?: A Lot Help needed climbing 3-5 steps with a railing? : Total 6 Click Score: 9    End of Session Equipment Utilized During Treatment: Gait belt Activity Tolerance: Patient tolerated treatment well Patient left: in chair;with call bell/phone within reach;with chair alarm set Nurse Communication: Mobility status PT Visit Diagnosis: Other abnormalities of gait and mobility (R26.89);History of falling (Z91.81);Pain Pain - Right/Left: Left Pain - part of body: Hip     Time: 1020-1102 PT Time Calculation (min) (ACUTE ONLY): 42 min  Charges:  $Gait Training: 8-22 mins $Therapeutic Exercise: 8-22 mins $Therapeutic Activity: 8-22 mins                    G Codes:       Van Clines, PT  Acute Rehabilitation Services Pager [phone] Office [phone]    Levi Aland 09/01/2017, 1:06 PM

## 2017-09-01 NOTE — Progress Notes (Signed)
Pts daughter at bedside and she is concerned about dizziness and nausea, and uncontrolled blood sugars. Asking about vertigo and diabetic gastroparesis. Pt seems to get dizzy and nauseous  when we move her. Educating pt to start taking blood sugar ACHS because of A1C. Family would like to talk to MD & social work and unable to come in during the day due to work. Daughter that is present only has a lunch break from 1-1:30 during the day.

## 2017-09-01 NOTE — Progress Notes (Signed)
[facility] Progress Note   Subjective: no new /co.    Objective Vitals:   08/31/17 1813 08/31/17 2015 09/01/17 0437 09/01/17 0918  BP: (!) 129/48 121/89 113/73 (!) 124/54  Pulse: 79 77 75 77  Resp: Temp: 98.9 F (37.2 C) 98.4 F (36.9 C) 98.2 F (36.8 C) 97.9 F (36.6 C)  TempSrc: Oral  Oral Oral  SpO2: 93%  100% 100%  Weight:      Height:       Physical Exam General: Thin, pleasant female in NAD Heart: S1,S2, RRR Lungs: CTAB. No WOB.  Abdomen: Active BS Extremities: No LE edema.  Dialysis Access: LUA AVF + bruit  Additional Objective Labs: Basic Metabolic Panel: Recent Labs  Lab 08/29/17 2240 08/30/17 0401 09/01/17 0726  NA 135 137 131*  K 5.1 5.7* 4.8  CL 94* 97* 89*  CO2 GLUCOSE 249* 321* 197*  BUN 65* 68* 54*  CREATININE 5.37* 5.55* 4.67*  CALCIUM 8.4* 8.3* 7.4*  PHOS  --   --  6.7*   Liver Function Tests: Recent Labs  Lab 08/30/17 0401 09/01/17 0726  ALBUMIN 3.2* 2.9*  CBC: Recent Labs  Lab 08/29/17 2240 08/30/17 0401 09/01/17 0726  WBC 6.0 6.6 4.2  NEUTROABS 4.4  --   --   HGB 13.5 12.8 11.6*  HCT 40.5 39.4 35.5*  MCV 92.3 92.7 92.9  PLT 197 177 168   CBG: Recent Labs  Lab 08/31/17 1454 08/31/17 1706 08/31/17 2014 09/01/17 0730 09/01/17 1153  GLUCAP 256* 199* 198* 204* 154*   I Studies/Results: Dg Esophagus  Result Date: 08/31/2017 CLINICAL DATA:  Dysphagia, difficulty swallowing food and liquids. EXAM: ESOPHOGRAM/BARIUM SWALLOW TECHNIQUE: Single contrast examination was performed using  thin barium. FLUOROSCOPY TIME:  Fluoroscopy Time:  1 minutes, 24 seconds Radiation Exposure Index (if provided by the fluoroscopic device): 8.0 mGy Number of Acquired Spot Images: 0 COMPARISON:  None. FINDINGS: The patient is relatively frail and accordingly the standard esophagram with standing, turning, carefully timed double contrast swallowing, and extensive maneuvering on the table was not feasible.  Instead, single swallows were performed with patient in the LPO position. There were occasional tertiary contractions in the esophagus as on image 10/3. Primary peristaltic waves in the esophagus were preserved on 3/4 swallows. As shown for example on image 19/1 and image 22/2, there is some slight narrowing of the thoracic esophagus distally which is smoothly marginated. This appears somewhat pulsatile, suggesting that the narrowing is probably due to esophageal compression between the enlarged heart and the spine. Mild smooth fold thickening in this segment of the esophagus is suggested on image 32/2. The patient swallowed a 13 mm barium pill which passed into the stomach without delay. Note is also made of cardiomegaly and atherosclerotic calcification of the aortic arch. IMPRESSION: 1. Smooth, likely extrinsic narrowing of the esophagus adjacent to the enlarged heart. This seems pulsatile and I suspect that the appearance is primarily due to the esophageal positioning between the enlarged heart and spine. Mild fold thickening of the esophagus in this vicinity suggests mild-to-moderate esophagitis. 2. Scattered tertiary contractions in the esophagus but primary peristaltic waves were preserved on 3/4 swallows. 3. Please note that due to patient frailty the typical protocol for esophagram could not be performed, and today's images were obtained with the patient laying down in the LPO position. This reduces the exams sensitivity and specificity compared to the normal comprehensive esophagram performed on patients able to stand,  turn, and follow complex swallowing instructions. 4. Atherosclerotic calcification of the aortic arch. Electronically Signed   By: Gaylyn Rong M.D.   On: 08/31/2017 08:42   Medications: . methocarbamol (ROBAXIN)  IV     . calcium acetate  667 mg Oral TID WC  . doxercalciferol  3 mcg Intravenous Q T,Th,Sa-HD  . feeding supplement (NEPRO CARB STEADY)  237 mL Oral BID BM  .  insulin aspart  0-5 Units Subcutaneous QHS  . insulin aspart  0-9 Units Subcutaneous TID WC  . multivitamin  1 tablet Oral QHS  . pantoprazole  40 mg Oral BID  . senna  1 tablet Oral BID   HD orders: [facility] T,Th,S 4 hrs 180 NRe 400/Auto 1.5 43.5 kg 2.0 K/2.25 Ca  - Heparin 1400 units IV TIW -Hectorol 3 mcg IV TIW   Assessment: 1. L Superior and inferior pubic rami fxs: Per primary. Seen by ortho-recommending conservative therapy. Consider whether or not patient will be able to tolerate sitting in recliner/transport to and from HD. Working with PT.  2. ESRD - cont TTS HD, ok to use heparin 1400 (is a low dose).  3. Anemia - HGB 12.8 monitor. No OP ESA.  4. Secondary hyperparathyroidism -Continue binders, VDRA. Ca 8.3 C Ca 8.9   5. HTN/volume - Fair control of BP.  No antihypertensive meds on OP med list. HD 05/12 Pre wt 44.6 Net UF 1.5 Post wt 43.1. No evidence of volume overload by exam. Keep EDW as it is for now.  6. Nutrition - Albumin 3.2. Renal/Carb mod diet, nepro, renal vits 7. DM: per primary 8. Dispo - per phys therapy recommending SNF placement   Plan - HD today  Vinson Moselle MD [facility] pgr [phone]   09/01/2017, 12:07 PM

## 2017-09-01 NOTE — Progress Notes (Signed)
Inpatient Diabetes Program Recommendations  AACE/ADA: New Consensus Statement on Inpatient Glycemic Control (2015)  Target Ranges:  Prepandial:   less than 140 mg/dL      Peak postprandial:   less than 180 mg/dL (1-2 hours)      Critically ill patients:  140 - 180 mg/dL   Lab Results  Component Value Date   GLUCAP 204 (H) 09/01/2017   HGBA1C 10.2 (H) 08/30/2017    Review of Glycemic Control Results for Frances Reeves, Frances Reeves (MRN [id number]) as of 09/01/2017 10:27  Ref. Range 08/31/2017 17:06 08/31/2017 20:14 09/01/2017 07:30  Glucose-Capillary Latest Ref Range: 65 - 99 mg/dL 409 (H) 811 (H) 914 (H)   Diabetes history: Type 2 DM Outpatient Diabetes medications: Amaryl 4 mg QD Current orders for Inpatient glycemic control: Novolog 0-9 units TID, Novolog 0-5 units QHS  Inpatient Diabetes Program Recommendations:     Noted progress and recommendations for SNF.   Would recommend adding Levemir 6 units (43 kg x .15) QHS. This seems a much safer option given renal status and inconsistent eating patterns. Additionally, would not order Amaryl at discharge to prevent potential hypoglycemia.   Needs PCP, will place case management consult to aide in this process.   Thanks, Lujean Rave, MSN, RNC-OB Diabetes Coordinator [phone] (8a-5p)

## 2017-09-01 NOTE — Progress Notes (Signed)
PROGRESS NOTE    Frances Reeves  ZOX:[id number] DOB: Jul 10, 1950 DOA: 08/29/2017 PCP: Patient, No Pcp Per   Brief Narrative: Frances Reeves is a 67 y.o. female with medical history significant of ESRD on HD, DM 2 anemia GERD,'s history of CVA    Presented with fall coming from home occurring Saturday, May 11 at 1 PM.  Patient fell down on her buttocks it was a mechanical fall. She was trying to reach up and fell backwards.  Patient initially thought she was okay and called her friend to let her know she was fine but later in the evening the friend has came over and found that the patient was sitting down the chair crying because of significant bilateral hip pain and unable to move. At baseline patient needs a cane or walker to ambulate. He was due for hemodialysis today but missed it last HD was Thursday. Reports poor appetite cough for the  Past 1 week, no chest pain,  No current shortness of breath, no fever. No appetite  X ray; Noted to have left superior and inferior pubic rami fractures  Patient admitted with pubic ramus fracture, plan for medical management. She will need SNF   Assessment & Plan:   Active Problems:   DM (diabetes mellitus) (HCC)   ESRD (end stage renal disease) (HCC)   GERD (gastroesophageal reflux disease)   Anemia   History of CVA (cerebrovascular accident)   Noncompliance of patient with renal dialysis (HCC)   Pubic ramus fracture, left, closed, initial encounter (HCC)   Chronic diastolic CHF (congestive heart failure) (HCC)   Pubic ramus fracture (HCC)   Malnutrition of moderate degree   1-Pubic Ramus fracture left closed.  Pain management.  PT. Weight bearing as tolerated.  Ortho recommended medical management. Pain controlled.  Needs SNF  2-ESRD, on HD T, Thus, Sat.  No HD since Tuesday.  Nephrology consulted. Had HD 5-12   3-Dysphagia; esophagogram; tertiary contraction esophagus, esophagitis. Not accurate due to inability to  stand up.  Start PPI and will likely need repeat esophagogram when she is able to stands up.   4-DM; SSI.    DVT prophylaxis: lovenox.  Code Status: full code.  Family Communication: care discussed with patient  Disposition Plan: might need snf   Consultants:   Ortho  Nephrology    Procedures:  none   Antimicrobials:   none   Subjective: She report pain as moderate.  She vomited yesterday. Today she has been abl;e to eat.   Objective: Vitals:   08/31/17 1813 08/31/17 2015 09/01/17 0437 09/01/17 0918  BP: (!) 129/48 121/89 113/73 (!) 124/54  Pulse: 79 77 75 77  Resp: Temp: 98.9 F (37.2 C) 98.4 F (36.9 C) 98.2 F (36.8 C) 97.9 F (36.6 C)  TempSrc: Oral  Oral Oral  SpO2: 93%  100% 100%  Weight:      Height:        Intake/Output Summary (Last 24 hours) at 09/01/2017 1655 Last data filed at 09/01/2017 1300 Gross per 24 hour  Intake 360 ml  Output 0 ml  Net 360 ml   Filed Weights   08/29/17 2157 08/30/17 1601 08/30/17 2001  Weight: 44.6 kg (98 lb 5.2 oz) 44.6 kg (98 lb 5.2 oz) 43.1 kg (95 lb 0.3 oz)    Examination:  General exam: NAD Respiratory system: CTA Cardiovascular system: S 1, S 2 RRR Gastrointestinal system: BS present, soft, nt Central nervous system: Alert.  Extremities;  symmetric power.  Skin: No rashes, lesions or ulcers    Data Reviewed: I have personally reviewed following labs and imaging studies  CBC: Recent Labs  Lab 08/29/17 2240 08/30/17 0401 09/01/17 0726  WBC 6.0 6.6 4.2  NEUTROABS 4.4  --   --   HGB 13.5 12.8 11.6*  HCT 40.5 39.4 35.5*  MCV 92.3 92.7 92.9  PLT 197 177 168   Basic Metabolic Panel: Recent Labs  Lab 08/29/17 2240 08/30/17 0401 09/01/17 0726  NA 135 137 131*  K 5.1 5.7* 4.8  CL 94* 97* 89*  CO2 GLUCOSE 249* 321* 197*  BUN 65* 68* 54*  CREATININE 5.37* 5.55* 4.67*  CALCIUM 8.4* 8.3* 7.4*  PHOS  --   --  6.7*   GFR: Estimated Creatinine Clearance: 7.3 mL/min  (A) (by C-G formula based on SCr of 4.67 mg/dL (H)). Liver Function Tests: Recent Labs  Lab 08/30/17 0401 09/01/17 0726  ALBUMIN 3.2* 2.9*   No results for input(s): LIPASE, AMYLASE in the last 168 hours. No results for input(s): AMMONIA in the last 168 hours. Coagulation Profile: No results for input(s): INR, PROTIME in the last 168 hours. Cardiac Enzymes: No results for input(s): CKTOTAL, CKMB, CKMBINDEX, TROPONINI in the last 168 hours. BNP (last 3 results) No results for input(s): PROBNP in the last 8760 hours. HbA1C: Recent Labs    08/30/17 0401  HGBA1C 10.2*   CBG: Recent Labs  Lab 08/31/17 1706 08/31/17 2014 09/01/17 0730 09/01/17 1153 09/01/17 1624  GLUCAP 199* 198* 204* 154* 136*   Lipid Profile: No results for input(s): CHOL, HDL, LDLCALC, TRIG, CHOLHDL, LDLDIRECT in the last 72 hours. Thyroid Function Tests: No results for input(s): TSH, T4TOTAL, FREET4, T3FREE, THYROIDAB in the last 72 hours. Anemia Panel: No results for input(s): VITAMINB12, FOLATE, FERRITIN, TIBC, IRON, RETICCTPCT in the last 72 hours. Sepsis Labs: No results for input(s): PROCALCITON, LATICACIDVEN in the last 168 hours.  Recent Results (from the past 240 hour(s))  MRSA PCR Screening     Status: None   Collection Time: 08/30/17 11:30 PM  Result Value Ref Range Status   MRSA by PCR NEGATIVE NEGATIVE Final    Comment:        The GeneXpert MRSA Assay (FDA approved for NASAL specimens only), is one component of a comprehensive MRSA colonization surveillance program. It is not intended to diagnose MRSA infection nor to guide or monitor treatment for MRSA infections. Performed at [facility], [address] [place], [place] [address]          Radiology Studies: Dg Esophagus  Result Date: 08/31/2017 CLINICAL DATA:  Dysphagia, difficulty swallowing food and liquids. EXAM: ESOPHOGRAM/BARIUM SWALLOW TECHNIQUE: Single contrast examination was performed using  thin barium.  FLUOROSCOPY TIME:  Fluoroscopy Time:  1 minutes, 24 seconds Radiation Exposure Index (if provided by the fluoroscopic device): 8.0 mGy Number of Acquired Spot Images: 0 COMPARISON:  None. FINDINGS: The patient is relatively frail and accordingly the standard esophagram with standing, turning, carefully timed double contrast swallowing, and extensive maneuvering on the table was not feasible. Instead, single swallows were performed with patient in the LPO position. There were occasional tertiary contractions in the esophagus as on image 10/3. Primary peristaltic waves in the esophagus were preserved on 3/4 swallows. As shown for example on image 19/1 and image 22/2, there is some slight narrowing of the thoracic esophagus distally which is smoothly marginated. This appears somewhat pulsatile, suggesting that the narrowing is probably  due to esophageal compression between the enlarged heart and the spine. Mild smooth fold thickening in this segment of the esophagus is suggested on image 32/2. The patient swallowed a 13 mm barium pill which passed into the stomach without delay. Note is also made of cardiomegaly and atherosclerotic calcification of the aortic arch. IMPRESSION: 1. Smooth, likely extrinsic narrowing of the esophagus adjacent to the enlarged heart. This seems pulsatile and I suspect that the appearance is primarily due to the esophageal positioning between the enlarged heart and spine. Mild fold thickening of the esophagus in this vicinity suggests mild-to-moderate esophagitis. 2. Scattered tertiary contractions in the esophagus but primary peristaltic waves were preserved on 3/4 swallows. 3. Please note that due to patient frailty the typical protocol for esophagram could not be performed, and today's images were obtained with the patient laying down in the LPO position. This reduces the exams sensitivity and specificity compared to the normal comprehensive esophagram performed on patients able to stand,  turn, and follow complex swallowing instructions. 4. Atherosclerotic calcification of the aortic arch. Electronically Signed   By: Gaylyn Rong M.D.   On: 08/31/2017 08:42        Scheduled Meds: . calcium acetate  667 mg Oral TID WC  . doxercalciferol  3 mcg Intravenous Q T,Th,Sa-HD  . feeding supplement (NEPRO CARB STEADY)  237 mL Oral BID BM  . insulin aspart  0-5 Units Subcutaneous QHS  . insulin aspart  0-9 Units Subcutaneous TID WC  . multivitamin  1 tablet Oral QHS  . pantoprazole  40 mg Oral BID  . senna  1 tablet Oral BID   Continuous Infusions: . methocarbamol (ROBAXIN)  IV       LOS: 2 days    Time spent: 35 minutes.     Alba Cory, MD [facility] Pager [phone]  If 7PM-7AM, please contact night-coverage [email or web address] Password [email or web address] 09/01/2017, 4:55 PM

## 2017-09-02 DIAGNOSIS — R1310 Dysphagia, unspecified: Secondary | ICD-10-CM

## 2017-09-02 DIAGNOSIS — N186 End stage renal disease: Secondary | ICD-10-CM | POA: Diagnosis not present

## 2017-09-02 DIAGNOSIS — E1122 Type 2 diabetes mellitus with diabetic chronic kidney disease: Secondary | ICD-10-CM

## 2017-09-02 DIAGNOSIS — Z992 Dependence on renal dialysis: Secondary | ICD-10-CM | POA: Diagnosis not present

## 2017-09-02 DIAGNOSIS — S32592A Other specified fracture of left pubis, initial encounter for closed fracture: Secondary | ICD-10-CM | POA: Diagnosis not present

## 2017-09-02 LAB — GLUCOSE, CAPILLARY
GLUCOSE-CAPILLARY: 215 mg/dL — AB (ref 65–99)
GLUCOSE-CAPILLARY: 157 mg/dL — AB (ref 65–99)
Glucose-Capillary: 212 mg/dL — ABNORMAL HIGH (ref 65–99)
Glucose-Capillary: 210 mg/dL — ABNORMAL HIGH (ref 65–99)
Glucose-Capillary: 329 mg/dL — ABNORMAL HIGH (ref 65–99)

## 2017-09-02 MED ORDER — PRO-STAT SUGAR FREE PO LIQD
30.0000 mL | Freq: Two times a day (BID) | ORAL | Status: DC
  Administered 2017-09-02 – 2017-09-04 (×5): 30 mL via ORAL
  Filled 2017-09-02 (×5): qty 30

## 2017-09-02 MED ORDER — HEPARIN SODIUM (PORCINE) 5000 UNIT/ML IJ SOLN
5000.0000 [IU] | Freq: Three times a day (TID) | INTRAMUSCULAR | Status: DC
  Administered 2017-09-02 – 2017-09-04 (×6): 5000 [IU] via SUBCUTANEOUS
  Filled 2017-09-02 (×5): qty 1

## 2017-09-02 NOTE — Progress Notes (Addendum)
[facility] Progress Note   Subjective: Seen on HD, No C/O pain.   Objective Vitals:   09/01/17 0918 09/01/17 1803 09/01/17 2203 09/02/17 0541  BP: (!) 124/54 (!) 143/64 (!) 148/55 (!) 128/54  Pulse: 77 79 77 78  Resp: Temp: 97.9 F (36.6 C) 98 F (36.7 C) 98.3 F (36.8 C) (!) 97.3 F (36.3 C)  TempSrc: Oral Oral  Oral  SpO2: 100% 95% 100% 97%  Weight:   46 kg (101 lb 8 oz)   Height:       Physical Exam General: Thin, pleasant female in NAD Heart: S1,S2, RRR Lungs: CTAB. No WOB.  Abdomen: Active BS Extremities: No LE edema.  Dialysis Access: LUA AVF cannulated at present   Additional Objective Labs: Basic Metabolic Panel: Recent Labs  Lab 08/29/17 2240 08/30/17 0401 09/01/17 0726  NA 135 137 131*  K 5.1 5.7* 4.8  CL 94* 97* 89*  CO2 GLUCOSE 249* 321* 197*  BUN 65* 68* 54*  CREATININE 5.37* 5.55* 4.67*  CALCIUM 8.4* 8.3* 7.4*  PHOS  --   --  6.7*   Liver Function Tests: Recent Labs  Lab 08/30/17 0401 09/01/17 0726  ALBUMIN 3.2* 2.9*   No results for input(s): LIPASE, AMYLASE in the last 168 hours. CBC: Recent Labs  Lab 08/29/17 2240 08/30/17 0401 09/01/17 0726  WBC 6.0 6.6 4.2  NEUTROABS 4.4  --   --   HGB 13.5 12.8 11.6*  HCT 40.5 39.4 35.5*  MCV 92.3 92.7 92.9  PLT 197 177 168   Blood Culture No results found for: SDES, SPECREQUEST, CULT, REPTSTATUS  Cardiac Enzymes: No results for input(s): CKTOTAL, CKMB, CKMBINDEX, TROPONINI in the last 168 hours. CBG: Recent Labs  Lab 08/31/17 2014 09/01/17 0730 09/01/17 1153 09/01/17 1624 09/01/17 2202  GLUCAP 198* 204* 154* 136* 212*   Iron Studies: No results for input(s): IRON, TIBC, TRANSFERRIN, FERRITIN in the last 72 hours. @ Studies/Results: No results found. Medications: . methocarbamol (ROBAXIN)  IV     . calcium acetate  667 mg Oral TID WC  . doxercalciferol  3 mcg Intravenous Q T,Th,Sa-HD  . feeding supplement (NEPRO CARB  STEADY)  237 mL Oral BID BM  . insulin aspart  0-5 Units Subcutaneous QHS  . insulin aspart  0-9 Units Subcutaneous TID WC  . multivitamin  1 tablet Oral QHS  . pantoprazole  40 mg Oral BID  . senna  1 tablet Oral BID   HD orders: [facility] T,Th,S 4 hrs 180 NRe 400/Auto 1.5 43.5 kg 2.0 K/2.25 Ca  - Heparin 1400 units IV TIW -Hectorol 3 mcg IV TIW   Assessment: 1. L Superior and inferior pubic rami fxs: Per primary. Seen by ortho-recommending conservative therapy. Working with PT. Will need to try HD in recliner prior to DC 2. ESRD - cont TTS HD. HD today, tight heparin. K+ 4.8 3. Anemia - HGB 11.6 monitor. No OP ESA.  4. Secondary hyperparathyroidism -Continue binders, VDRA. Ca 7.4 C Ca 8.3 Phos high 6.7 adjust binders.   5. HTN/volume - Fair control of BP.  No antihypertensive meds on OP med list. Pre wt 46 kg UFG 2.0  6. Nutrition - Albumin 2.9. Renal/Carb mod diet, nepro, renal vits, add prostat 7. DM: per primary 8. Dispo - per phys therapy recommending SNF placement   Frances H. Brown NP-C 09/02/2017, 10:37 AM   [facility] [phone]  Pt seen, examined and agree w A/P  as above.  Vinson Moselle MD [employer] pager [phone]   09/02/2017, 2:14 PM

## 2017-09-02 NOTE — Progress Notes (Signed)
   09/02/17 0900  Clinical Encounter Type  Visit Type Initial   Pt was not in her room . Chaplain talked to nurse who said pt had gone to dialysis. Chaplain will revisit before end of the day.  Ramiya Delahunty a Water quality scientist, [employer]

## 2017-09-02 NOTE — Progress Notes (Signed)
[facility] PROGRESS NOTE  CARMEN TOLLIVER WUJ:[id number] DOB: 06/15/1950 DOA: 08/29/2017  PCP: Patient, No Pcp Per  Brief History/Interval Summary: 67 year old female with a past medical history of end-stage renal disease on hemodialysis, known compliance, diabetes mellitus type 2, anemia, GERD presented after mechanical fall at home.  She could not get up due to pain.  EMS was called.  Patient was found to have superior and inferior pubic rami fracture on the left.  Due to severe pain and inability to ambulate she was hospitalized.  Reason for Visit: Pubic rami fracture  Consultants: Nephrology  Procedures: None  Antibiotics: None  Subjective/Interval History: Patient seen while she was getting dialyzed.  Denies any complaints.  Apparently there was some issues overnight with nausea and dizziness.   Objective:  Vital Signs  Vitals:   09/02/17 1100 09/02/17 1130 09/02/17 1159 09/02/17 1252  BP: (!) 132/54 (!) 138/55 (P) 135/75 (!) 147/51  Pulse: 80 79 (P) 79 83  Resp: 13 11 (P) 10 18  Temp:    98.4 F (36.9 C)  TempSrc:    Oral  SpO2: 96% 100% (P) 95% 96%  Weight:      Height:        Intake/Output Summary (Last 24 hours) at 09/02/2017 1323 Last data filed at 09/02/2017 0541 Gross per 24 hour  Intake 0 ml  Output 0 ml  Net 0 ml   Filed Weights   08/30/17 2001 09/01/17 2203 09/02/17 0745  Weight: 43.1 kg (95 lb 0.3 oz) 46 kg (101 lb 8 oz) 44.6 kg (98 lb 5.2 oz)    General appearance: alert, cooperative, appears stated age and no distress Head: Normocephalic, without obvious abnormality, atraumatic Resp: clear to auscultation bilaterally Cardio: regular rate and rhythm, S1, S2 normal, no murmur, click, rub or gallop GI: soft, non-tender; bowel sounds normal; no masses,  no organomegaly Extremities: Restricted range of motion of both lower extremities. Neurologic: No obvious focal neurological deficits.  Lab Results:  Data Reviewed: I have  personally reviewed following labs and imaging studies  CBC: Recent Labs  Lab 08/29/17 2240 08/30/17 0401 09/01/17 0726  WBC 6.0 6.6 4.2  NEUTROABS 4.4  --   --   HGB 13.5 12.8 11.6*  HCT 40.5 39.4 35.5*  MCV 92.3 92.7 92.9  PLT 197 177 168    Basic Metabolic Panel: Recent Labs  Lab 08/29/17 2240 08/30/17 0401 09/01/17 0726  NA 135 137 131*  K 5.1 5.7* 4.8  CL 94* 97* 89*  CO2 GLUCOSE 249* 321* 197*  BUN 65* 68* 54*  CREATININE 5.37* 5.55* 4.67*  CALCIUM 8.4* 8.3* 7.4*  PHOS  --   --  6.7*    GFR: Estimated Creatinine Clearance: 7.3 mL/min (A) (by C-G formula based on SCr of 4.67 mg/dL (H)).  Liver Function Tests: Recent Labs  Lab 08/30/17 0401 09/01/17 0726  ALBUMIN 3.2* 2.9*    CBG: Recent Labs  Lab 09/01/17 1153 09/01/17 1624 09/01/17 2202 09/02/17 0800 09/02/17 1253  GLUCAP 154* 136* 212* 210* 215*     Recent Results (from the past 240 hour(s))  MRSA PCR Screening     Status: None   Collection Time: 08/30/17 11:30 PM  Result Value Ref Range Status   MRSA by PCR NEGATIVE NEGATIVE Final    Comment:        The GeneXpert MRSA Assay (FDA approved for NASAL specimens only), is one component of a comprehensive MRSA colonization surveillance program. It is  not intended to diagnose MRSA infection nor to guide or monitor treatment for MRSA infections. Performed at [facility], [address], [place], [place] [address]       Radiology Studies: No results found.   Medications:  Scheduled: . calcium acetate  667 mg Oral TID WC  . doxercalciferol  3 mcg Intravenous Q T,Th,Sa-HD  . feeding supplement (NEPRO CARB STEADY)  237 mL Oral BID BM  . feeding supplement (PRO-STAT SUGAR FREE 64)  30 mL Oral BID  . insulin aspart  0-5 Units Subcutaneous QHS  . insulin aspart  0-9 Units Subcutaneous TID WC  . multivitamin  1 tablet Oral QHS  . pantoprazole  40 mg Oral BID  . senna  1 tablet Oral BID   Continuous: . methocarbamol  (ROBAXIN)  IV     UEA:[id number], HYDROcodone-acetaminophen, methocarbamol **OR** methocarbamol (ROBAXIN)  IV, morphine injection, ondansetron (ZOFRAN) IV, polyethylene glycol  Assessment/Plan:     Pubic Ramus fracture left closed.  Main management is pain control.  This was recommended by orthopedics.  PT and OT following.  Patient needs short-term rehab at a skilled nursing facility.  Social worker is following.    ESRD, on HD T, Thus, Sat.  Patient noncompliant with her dialysis regimen.  Nephrology is following.  Being dialyzed in the hospital.  Will need to be dialyzed in recliner prior to discharge.  Dysphagia Esophagogram; tertiary contraction esophagus, esophagitis. Not accurate due to inability to stand up.  Study will need to be repeated when she is more mobile and able to be upright, perhaps in a few weeks.  Continue PPI for now.  Seems to be tolerating her diet.  Could downgrade to soft diet if needed.  Diabetes mellitus type 2  Continue SSI   DVT Prophylaxis: SQ Heparin Code Status: Full code Family Communication: Discussed with the patient Disposition Plan: Management as outlined above.    LOS: 3 days   Osvaldo Shipper  [facility] Pager [phone] 09/02/2017, 1:23 PM  If 7PM-7AM, please contact night-coverage at [email or web address], password [email or web address]

## 2017-09-03 DIAGNOSIS — Z992 Dependence on renal dialysis: Secondary | ICD-10-CM | POA: Diagnosis not present

## 2017-09-03 DIAGNOSIS — E1122 Type 2 diabetes mellitus with diabetic chronic kidney disease: Secondary | ICD-10-CM | POA: Diagnosis not present

## 2017-09-03 DIAGNOSIS — S32592A Other specified fracture of left pubis, initial encounter for closed fracture: Secondary | ICD-10-CM | POA: Diagnosis not present

## 2017-09-03 DIAGNOSIS — N186 End stage renal disease: Secondary | ICD-10-CM | POA: Diagnosis not present

## 2017-09-03 LAB — CBC
HEMATOCRIT: 38.5 % (ref 36.0–46.0)
Hemoglobin: 12.6 g/dL (ref 12.0–15.0)
MCH: 29.7 pg (ref 26.0–34.0)
MCHC: 32.7 g/dL (ref 30.0–36.0)
MCV: 90.8 fL (ref 78.0–100.0)
PLATELETS: 213 10*3/uL (ref 150–400)
RBC: 4.24 MIL/uL (ref 3.87–5.11)
RDW: 14.5 % (ref 11.5–15.5)
WBC: 7.9 10*3/uL (ref 4.0–10.5)

## 2017-09-03 LAB — GLUCOSE, CAPILLARY
GLUCOSE-CAPILLARY: 209 mg/dL — AB (ref 65–99)
Glucose-Capillary: 169 mg/dL — ABNORMAL HIGH (ref 65–99)
Glucose-Capillary: 266 mg/dL — ABNORMAL HIGH (ref 65–99)
Glucose-Capillary: 211 mg/dL — ABNORMAL HIGH (ref 65–99)

## 2017-09-03 LAB — BASIC METABOLIC PANEL
Anion gap: 15 (ref 5–15)
BUN: 33 mg/dL — AB (ref 6–20)
CALCIUM: 8.1 mg/dL — AB (ref 8.9–10.3)
CO2: 28 mmol/L (ref 22–32)
Chloride: 90 mmol/L — ABNORMAL LOW (ref 101–111)
Creatinine, Ser: 3.45 mg/dL — ABNORMAL HIGH (ref 0.44–1.00)
GFR calc Af Amer: 15 mL/min — ABNORMAL LOW (ref >60)
GFR, EST NON AFRICAN AMERICAN: 13 mL/min — AB (ref >60)
GLUCOSE: 276 mg/dL — AB (ref 65–99)
Potassium: 3.9 mmol/L (ref 3.5–5.1)
Sodium: 133 mmol/L — ABNORMAL LOW (ref 135–145)

## 2017-09-03 NOTE — Progress Notes (Signed)
[facility] PROGRESS NOTE  Frances Reeves ZOX:[id number] DOB: [birth year] DOA: 08/29/2017  PCP: Patient, No Pcp Per  Brief History/Interval Summary: 67 year old female with a past medical history of end-stage renal disease on hemodialysis, noncompliance, diabetes mellitus type 2, anemia, GERD presented after mechanical fall at home.  She could not get up due to pain.  EMS was called.  Patient was found to have superior and inferior pubic rami fracture on the left.  Due to severe pain and inability to ambulate she was hospitalized.  Reason for Visit: Pubic rami fracture  Consultants: Nephrology  Procedures: Hemodialysis  Antibiotics: None  Subjective/Interval History: Patient states that she feels well.  Pain is better controlled.  No new complaints.     Objective:  Vital Signs  Vitals:   09/02/17 1252 09/02/17 1544 09/02/17 2028 09/03/17 0452  BP: (!) 147/51 (!) 115/49 (!) 141/53 (!) 151/58  Pulse: 83 79 83 95  Resp: Temp: 98.4 F (36.9 C) 98.4 F (36.9 C) 98 F (36.7 C) 98 F (36.7 C)  TempSrc: Oral Oral Oral   SpO2: 96% (!) 87% 98% 100%  Weight:   43.6 kg (96 lb 1.9 oz)   Height:        Intake/Output Summary (Last 24 hours) at 09/03/2017 0908 Last data filed at 09/03/2017 0452 Gross per 24 hour  Intake 360 ml  Output 1600 ml  Net -1240 ml   Filed Weights   09/02/17 0745 09/02/17 1230 09/02/17 2028  Weight: 44.6 kg (98 lb 5.2 oz) 43.6 kg (96 lb 1.9 oz) 43.6 kg (96 lb 1.9 oz)    General appearance: Awake alert.  No distress. Resp: Clear to auscultation bilaterally.  No wheezing rales or rhonchi. Cardio: S1-S2 is normal and regular.  No S3-S4.  No rubs murmurs or bruit. GI: Abdomen is soft.  Nontender nondistended.  Bowel sounds are present.  No masses organomegaly Extremities: Restricted range of motion of both lower extremities.  Some improvement in range of motion noted today. Neurologic: No obvious focal neurological  deficits.  Lab Results:  Data Reviewed: I have personally reviewed following labs and imaging studies  CBC: Recent Labs  Lab 08/29/17 2240 08/30/17 0401 09/01/17 0726 09/03/17 0353  WBC 6.0 6.6 4.2 7.9  NEUTROABS 4.4  --   --   --   HGB 13.5 12.8 11.6* 12.6  HCT 40.5 39.4 35.5* 38.5  MCV 92.3 92.7 92.9 90.8  PLT 197 177 168 213    Basic Metabolic Panel: Recent Labs  Lab 08/29/17 2240 08/30/17 0401 09/01/17 0726 09/03/17 0353  NA 135 137 131* 133*  K 5.1 5.7* 4.8 3.9  CL 94* 97* 89* 90*  CO2 GLUCOSE 249* 321* 197* 276*  BUN 65* 68* 54* 33*  CREATININE 5.37* 5.55* 4.67* 3.45*  CALCIUM 8.4* 8.3* 7.4* 8.1*  PHOS  --   --  6.7*  --     GFR: Estimated Creatinine Clearance: 9.9 mL/min (A) (by C-G formula based on SCr of 3.45 mg/dL (H)).  Liver Function Tests: Recent Labs  Lab 08/30/17 0401 09/01/17 0726  ALBUMIN 3.2* 2.9*    CBG: Recent Labs  Lab 09/01/17 2202 09/02/17 0800 09/02/17 1253 09/02/17 1656 09/02/17 2027  GLUCAP 212* 210* 215* 329* 157*     Recent Results (from the past 240 hour(s))  MRSA PCR Screening     Status: None   Collection Time: 08/30/17 11:30 PM  Result Value Ref Range Status  MRSA by PCR NEGATIVE NEGATIVE Final    Comment:        The GeneXpert MRSA Assay (FDA approved for NASAL specimens only), is one component of a comprehensive MRSA colonization surveillance program. It is not intended to diagnose MRSA infection nor to guide or monitor treatment for MRSA infections. Performed at [facility], [address], [place], [place] [address]       Radiology Studies: No results found.   Medications:  Scheduled: . calcium acetate  667 mg Oral TID WC  . doxercalciferol  3 mcg Intravenous Q T,Th,Sa-HD  . feeding supplement (NEPRO CARB STEADY)  237 mL Oral BID BM  . feeding supplement (PRO-STAT SUGAR FREE 64)  30 mL Oral BID  . heparin injection (subcutaneous)  5,000 Units Subcutaneous Q8H  . insulin  aspart  0-5 Units Subcutaneous QHS  . insulin aspart  0-9 Units Subcutaneous TID WC  . multivitamin  1 tablet Oral QHS  . pantoprazole  40 mg Oral BID  . senna  1 tablet Oral BID   Continuous: . methocarbamol (ROBAXIN)  IV     DGL:OVFIEPPIR, HYDROcodone-acetaminophen, methocarbamol **OR** methocarbamol (ROBAXIN)  IV, morphine injection, ondansetron (ZOFRAN) IV, polyethylene glycol  Assessment/Plan:   Left Pubic Ramus fracture, closed.  Continue pain medications.  PT and OT.  No role for surgical intervention.  Will likely need skilled nursing facility for rehab.  Social worker is following.  Await bed availability.     ESRD, on HD TTS  Patient noncompliant with her dialysis regimen.  Nephrology is following.  Being dialyzed in the hospital.  Will need to be dialyzed in recliner prior to discharge.  Dysphagia Esophagogram showed tertiary contraction and esophagitis. Not accurate due to inability to stand up.  Study will need to be repeated when she is more mobile and able to be upright, perhaps in a few weeks.  Continue PPI for now.  Seems to be tolerating her diet.  Could downgrade to soft diet if needed.  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  CBGs noted to be elevated.  She takes Amaryl at home.  DVT Prophylaxis: SQ Heparin Code Status: Full code Family Communication: Discussed with the patient Disposition Plan: Management as outlined above.  Anticipate discharge tomorrow once skilled nursing facility bed is available.    LOS: 4 days   Osvaldo Shipper  [facility] Pager [phone] 09/03/2017, 9:08 AM  If 7PM-7AM, please contact night-coverage at [email or web address], password [facility]

## 2017-09-03 NOTE — Progress Notes (Signed)
Inpatient Diabetes Program Recommendations  AACE/ADA: New Consensus Statement on Inpatient Glycemic Control (2015)  Target Ranges:  Prepandial:   less than 140 mg/dL      Peak postprandial:   less than 180 mg/dL (1-2 hours)      Critically ill patients:  140 - 180 mg/dL   Results for JELISA, [place] (MRN [id number]) as of 09/03/2017 10:09  Ref. Range 09/02/2017 08:00 09/02/2017 12:53 09/02/2017 16:56 09/02/2017 20:27 09/03/2017 07:42  Glucose-Capillary Latest Ref Range: 65 - 99 mg/dL 409 (H) 811 (H) 914 (H) 157 (H) 209 (H)   Review of Glycemic Control  Diabetes history: DM2 Outpatient Diabetes medications: Amaryl 4 mg daily Current orders for Inpatient glycemic control: Novolog 0-9 units TID with meals, Novolog 0-5 units QHS  Inpatient Diabetes Program Recommendations: Insulin - Basal: Glucose ranged from 157 to 329 mg/dl over the past 24 hours and fasting glucose 209 mg/dl today. Please consider ordering Levemir 5 units Q24H.  Thanks, Orlando Penner, RN, MSN, CDE Diabetes Coordinator Inpatient Diabetes Program [phone] (Team Pager from 8am to 5pm)

## 2017-09-03 NOTE — Progress Notes (Signed)
Physical Therapy Treatment Patient Details Name: GIANNI FUCHS MRN: [id number] DOB: 19-Jan-1951 Today's Date: 09/03/2017    History of Present Illness Pt is a 67 y.o. female presenting following fall at home. Pt found to have L superior and inferior pubic rami fractures. PMHx: CKD on HD, DM, GERD, CVA.    PT Comments    Patient is progressing very well towards their physical therapy goals. Increased ambulation distance to 30 feet today with RW and two person minimal assistance (chair follow utilized). Increased LLE weightbearing throughout gait although patient still limited by pain. SNF remains appropriate recommendation in order to maximize functional independence and decrease caregiver burden.    Follow Up Recommendations  SNF     Equipment Recommendations  Rolling walker with 5" wheels;3in1 (PT);Other (comment)((youth sized))    Recommendations for Other Services       Precautions / Restrictions Precautions Precautions: Fall Restrictions Weight Bearing Restrictions: Yes RLE Weight Bearing: Weight bearing as tolerated LLE Weight Bearing: Weight bearing as tolerated    Mobility  Bed Mobility Overal bed mobility: Needs Assistance Bed Mobility: Supine to Sit     Supine to sit: Min assist     General bed mobility comments: min assist for progressing LLE to edge of bed. Increased time overall for supine to sit  Transfers Overall transfer level: Needs assistance Equipment used: Rolling walker (2 wheeled) Transfers: Sit to/from [employer] Sit to Stand: Mod assist         General transfer comment: Patient requiring moderate assistance to power up. Once standing, patient with heavy posterior lean, requiring her to sit back down. On 2nd trial of standing, patient requiring min assist with tactile/verbal cueing for hip extension. Patient incorporating wide BOS and needing reminders for hand placement.  Ambulation/Gait Ambulation/Gait assistance:  Min assist;+2 physical assistance;+2 safety/equipment Ambulation Distance (Feet): 20 Feet Assistive device: Rolling walker (2 wheeled) Gait Pattern/deviations: Shuffle;Decreased stance time - left   Gait velocity interpretation: <1.31 ft/sec, indicative of household ambulator General Gait Details: VC's for sequencing. Very slow, guarded gait with decreased LLE weightbearing. Tactile cueing for weight shifting provided. Chair follow utilized.   Stairs             Wheelchair Mobility    Modified Rankin (Stroke Patients Only)       Balance Overall balance assessment: Needs assistance Sitting-balance support: No upper extremity supported;Feet unsupported Sitting balance-Leahy Scale: Good     Standing balance support: Bilateral upper extremity supported Standing balance-Leahy Scale: Poor Standing balance comment: reliant on UE support                            Cognition Arousal/Alertness: Awake/alert Behavior During Therapy: WFL for tasks assessed/performed Overall Cognitive Status: Within Functional Limits for tasks assessed                                        Exercises General Exercises - Lower Extremity Ankle Circles/Pumps: 10 reps;Both;Seated Quad Sets: Seated;10 reps;Both    General Comments General comments (skin integrity, edema, etc.): O2 saturations 93-95% on RA      Pertinent Vitals/Pain Pain Assessment: Faces Faces Pain Scale: Hurts even more Pain Location: L hip/groin Pain Descriptors / Indicators: Discomfort;Sore Pain Intervention(s): Limited activity within patient's tolerance;Monitored during session    Home Living  Prior Function            PT Goals (current goals can now be found in the care plan section) Acute Rehab PT Goals Patient Stated Goal: decrease pain, get better PT Goal Formulation: With patient Time For Goal Achievement: 09/14/17 Potential to Achieve Goals:  Good Progress towards PT goals: Progressing toward goals    Frequency    Min 3X/week      PT Plan Current plan remains appropriate    Co-evaluation              AM-PAC PT "6 Clicks" Daily Activity  Outcome Measure  Difficulty turning over in bed (including adjusting bedclothes, sheets and blankets)?: A Lot Difficulty moving from lying on back to sitting on the side of the bed? : Unable Difficulty sitting down on and standing up from a chair with arms (e.g., wheelchair, bedside commode, etc,.)?: Unable Help needed moving to and from a bed to chair (including a wheelchair)?: A Little Help needed walking in hospital room?: A Little Help needed climbing 3-5 steps with a railing? : Total 6 Click Score: 11    End of Session Equipment Utilized During Treatment: Gait belt Activity Tolerance: Patient tolerated treatment well Patient left: in chair;with call bell/phone within reach;with chair alarm set Nurse Communication: Mobility status PT Visit Diagnosis: Other abnormalities of gait and mobility (R26.89);History of falling (Z91.81);Pain Pain - Right/Left: Left Pain - part of body: Hip     Time: 9604-5409 PT Time Calculation (min) (ACUTE ONLY): 20 min  Charges:  $Gait Training: 8-22 mins                    G Codes:       Laurina Bustle, PT, DPT Acute Rehabilitation Services  Pager: [phone]    Vanetta Mulders 09/03/2017, 12:17 PM

## 2017-09-03 NOTE — Progress Notes (Signed)
[facility] Progress Note   Subjective: Seen in room w/ daughter as interpreter  Objective Vitals:   09/02/17 1544 09/02/17 2028 09/03/17 0452 09/03/17 0948  BP: (!) 115/49 (!) 141/53 (!) 151/58 (!) 115/52  Pulse: 79 83 95 80  Resp: Temp: 98.4 F (36.9 C) 98 F (36.7 C) 98 F (36.7 C) 99.9 F (37.7 C)  TempSrc: Oral Oral  Oral  SpO2: (!) 87% 98% 100% 100%  Weight:  43.6 kg (96 lb 1.9 oz)    Height:       Physical Exam General: Thin, pleasant female in NAD Heart: S1,S2, RRR Lungs: CTAB. No WOB.  Abdomen: Active BS Extremities: No LE edema.  Dialysis Access: LUA AVF cannulated at present   Additional Objective Labs: Basic Metabolic Panel: Recent Labs  Lab 08/30/17 0401 09/01/17 0726 09/03/17 0353  NA 137 131* 133*  K 5.7* 4.8 3.9  CL 97* 89* 90*  CO2 GLUCOSE 321* 197* 276*  BUN 68* 54* 33*  CREATININE 5.55* 4.67* 3.45*  CALCIUM 8.3* 7.4* 8.1*  PHOS  --  6.7*  --    Liver Function Tests: Recent Labs  Lab 08/30/17 0401 09/01/17 0726  ALBUMIN 3.2* 2.9*   No results for input(s): LIPASE, AMYLASE in the last 168 hours. CBC: Recent Labs  Lab 08/29/17 2240 08/30/17 0401 09/01/17 0726 09/03/17 0353  WBC 6.0 6.6 4.2 7.9  NEUTROABS 4.4  --   --   --   HGB 13.5 12.8 11.6* 12.6  HCT 40.5 39.4 35.5* 38.5  MCV 92.3 92.7 92.9 90.8  PLT 197 177 168 213   Blood Culture No results found for: SDES, SPECREQUEST, CULT, REPTSTATUS  Cardiac Enzymes: No results for input(s): CKTOTAL, CKMB, CKMBINDEX, TROPONINI in the last 168 hours. CBG: Recent Labs  Lab 09/02/17 1253 09/02/17 1656 09/02/17 2027 09/03/17 0742 09/03/17 1145  GLUCAP 215* 329* 157* 209* 169*   Iron Studies: No results for input(s): IRON, TIBC, TRANSFERRIN, FERRITIN in the last 72 hours. @ Studies/Results: No results found. Medications: . methocarbamol (ROBAXIN)  IV     . calcium acetate  667 mg Oral TID WC  . doxercalciferol  3 mcg  Intravenous Q T,Th,Sa-HD  . feeding supplement (NEPRO CARB STEADY)  237 mL Oral BID BM  . feeding supplement (PRO-STAT SUGAR FREE 64)  30 mL Oral BID  . heparin injection (subcutaneous)  5,000 Units Subcutaneous Q8H  . insulin aspart  0-5 Units Subcutaneous QHS  . insulin aspart  0-9 Units Subcutaneous TID WC  . multivitamin  1 tablet Oral QHS  . pantoprazole  40 mg Oral BID  . senna  1 tablet Oral BID   HD orders: [facility] TTS 4h  43.5kg  2/2.25 bath  Hep 1400  LUA AVF -Hectorol 3 mcg IV TIW   Assessment/Plan: 1. L Sup/ inferior pubic ramus fx's - per ortho conservative Rx with PT 2. ESRD - cont TTS HD. Plan HD Fri am off schedule , needs to do HD in a chair prior to dc.  3. Anemia - HGB 11.6 monitor. No OP ESA.  4. MBD ckd -Continue binders, VDRA. Ca 7.4 C Ca 8.3 Phos high 6.7 adjust binders.   5. HTN/volume - Fair control of BP.  No antihypertensive meds on OP med list 6. Nutrition - Albumin 2.9. Renal/Carb mod diet, nepro, renal vits, add prostat 7. DM: per primary 8. Dispo - SNF placement    Vinson Moselle MD [facility] pager [phone]   09/03/2017, 12:46 PM

## 2017-09-03 NOTE — NC FL2 (Signed)
[place] MEDICAID FL2 LEVEL OF CARE SCREENING TOOL     IDENTIFICATION  Patient Name: Frances Reeves Birthdate: 1951/02/10 Sex: female Admission Date (Current Location): 08/29/2017  [place] and [place] Number:  [facility]nes Bast [id number] S Facility and Address:  [facility] [place], [address] [place], [place] [address]      Provider Number: [id number]  Attending Physician Name and Address:  Osvaldo Shipper, MD  Relative Name and Phone Number:  Raechel Chute; [phone]    Current Level of Care: Hospital Recommended Level of Care: Skilled Nursing Facility Prior Approval Number:    Date Approved/Denied:   PASRR Number: [id number](Eff. 09/03/17)  Discharge Plan: SNF    Current Diagnoses: Patient Active Problem List   Diagnosis Date Noted  . Malnutrition of moderate degree 09/01/2017  . Pubic ramus fracture, left, closed, initial encounter (HCC) 08/30/2017  . Chronic diastolic CHF (congestive heart failure) (HCC) 08/30/2017  . Pubic ramus fracture (HCC) 08/30/2017  . Noncompliance of patient with renal dialysis (HCC) 05/13/2017  . DM (diabetes mellitus) (HCC) 02/07/2017  . ESRD (end stage renal disease) (HCC) 02/07/2017  . GERD (gastroesophageal reflux disease) 02/07/2017  . Anemia 02/07/2017  . History of CVA (cerebrovascular accident) 02/07/2017  . Chest pain 02/07/2017  . Shortness of breath 02/07/2017    Orientation RESPIRATION BLADDER Height & Weight     Self, Time, Situation, Place  Normal Continent Weight: 96 lb 1.9 oz (43.6 kg) Height:   (147.3 cm)  BEHAVIORAL SYMPTOMS/MOOD NEUROLOGICAL BOWEL NUTRITION STATUS      Continent Diet(Renal/carb modified with 1200 mL fluid restriction)  AMBULATORY STATUS COMMUNICATION OF NEEDS Skin   Limited Assist Verbally Other (Comment)(Small black scab right lower leg)                       Personal Care Assistance Level of Assistance  Bathing, Feeding, Dressing Bathing  Assistance: Maximum assistance(Upper body set-up and Lower body max assist) Feeding assistance: Limited assistance(Assistance with set-up) Dressing Assistance: Maximum assistance(Upper body set-up and Lower body max assist)     Functional Limitations Info  Sight, Hearing, Speech Sight Info: Adequate Hearing Info: Adequate Speech Info: Adequate(Spanish speaking)    SPECIAL CARE FACTORS FREQUENCY  PT (By licensed PT), OT (By licensed OT)     PT Frequency: Evaluated 5/9 and a minimum of 3X per week recommended during acute inpatient stay OT Frequency: Evaluated 5/13 and a minimum of 2X per week recommended during acute inpatient stay            Contractures Contractures Info: Not present    Additional Factors Info  Code Status, Allergies, Insulin Sliding Scale Code Status Info: Full Allergies Info: No known allergies   Insulin Sliding Scale Info: 0-5 Units daily at bedtime; 0-9 Units daily with meals       Current Medications (09/03/2017):  This is the current hospital active medication list Current Facility-Administered Medications  Medication Dose Route Frequency Provider Last Rate Last Dose  . bisacodyl (DULCOLAX) suppository 10 mg  10 mg Rectal Daily PRN Therisa Doyne, MD   10 mg at 09/02/17 2258  . calcium acetate (PHOSLO) capsule 667 mg  667 mg Oral TID WC Pola Corn, NP   667 mg at 09/03/17 1231  . doxercalciferol (HECTOROL) injection 3 mcg  3 mcg Intravenous Q T,Th,Sa-HD Zeyfang, David, PA-C      . feeding supplement (NEPRO CARB STEADY) liquid 237 mL  237 mL Oral BID BM Pola Corn, NP  237 mL at 09/03/17 1232  . feeding supplement (PRO-STAT SUGAR FREE 64) liquid 30 mL  30 mL Oral BID Pola Corn, NP   30 mL at 09/03/17 0820  . heparin injection 5,000 Units  5,000 Units Subcutaneous Q8H Osvaldo Shipper, MD   5,000 Units at 09/02/17 2147  . HYDROcodone-acetaminophen (NORCO/VICODIN) 5-325 MG per tablet 1-2 tablet  1-2 tablet Oral Q6H PRN  Therisa Doyne, MD   2 tablet at 09/03/17 1231  . insulin aspart (novoLOG) injection 0-5 Units  0-5 Units Subcutaneous QHS Therisa Doyne, MD   2 Units at 09/01/17 2206  . insulin aspart (novoLOG) injection 0-9 Units  0-9 Units Subcutaneous TID WC Therisa Doyne, MD   2 Units at 09/03/17 1231  . methocarbamol (ROBAXIN) tablet 500 mg  500 mg Oral Q6H PRN Therisa Doyne, MD   500 mg at 09/02/17 2147   Or  . methocarbamol (ROBAXIN) 500 mg in dextrose 5 % 50 mL IVPB  500 mg Intravenous Q6H PRN Doutova, Anastassia, MD      . morphine 4 MG/ML injection 0.52 mg  0.52 mg Intravenous Q2H PRN Doutova, Anastassia, MD   0.52 mg at 09/02/17 2257  . multivitamin (RENA-VIT) tablet 1 tablet  1 tablet Oral QHS Lenny Pastel, PA-C   1 tablet at 09/02/17 2147  . ondansetron (ZOFRAN) injection 4 mg  4 mg Intravenous Q8H PRN Pola Corn, NP   4 mg at 09/02/17 1935  . pantoprazole (PROTONIX) EC tablet 40 mg  40 mg Oral BID Regalado, Belkys A, MD   40 mg at 09/03/17 0821  . polyethylene glycol (MIRALAX / GLYCOLAX) packet 17 g  17 g Oral Daily PRN Doutova, Anastassia, MD      . senna (SENOKOT) tablet 8.6 mg  1 tablet Oral BID Therisa Doyne, MD   8.6 mg at 09/03/17 1610     Discharge Medications: Please see discharge summary for a list of discharge medications.  Relevant Imaging Results:  Relevant Lab Results:   Additional Information ss#[id number]. Dialsyis patient [facility]) TTS.  Patient is spanish speaking.  Cristobal Goldmann, LCSW

## 2017-09-04 ENCOUNTER — Other Ambulatory Visit: Payer: Self-pay

## 2017-09-04 DIAGNOSIS — S32592A Other specified fracture of left pubis, initial encounter for closed fracture: Secondary | ICD-10-CM | POA: Diagnosis not present

## 2017-09-04 DIAGNOSIS — R05 Cough: Secondary | ICD-10-CM | POA: Diagnosis not present

## 2017-09-04 LAB — RENAL FUNCTION PANEL
ANION GAP: 15 (ref 5–15)
Albumin: 2.7 g/dL — ABNORMAL LOW (ref 3.5–5.0)
BUN: 66 mg/dL — ABNORMAL HIGH (ref 6–20)
CHLORIDE: 88 mmol/L — AB (ref 101–111)
CO2: 27 mmol/L (ref 22–32)
CREATININE: 5.1 mg/dL — AB (ref 0.44–1.00)
Calcium: 8.1 mg/dL — ABNORMAL LOW (ref 8.9–10.3)
GFR, EST AFRICAN AMERICAN: 9 mL/min — AB (ref >60)
GFR, EST NON AFRICAN AMERICAN: 8 mL/min — AB (ref >60)
Glucose, Bld: 233 mg/dL — ABNORMAL HIGH (ref 65–99)
Phosphorus: 4.5 mg/dL (ref 2.5–4.6)
Potassium: 4.4 mmol/L (ref 3.5–5.1)
Sodium: 130 mmol/L — ABNORMAL LOW (ref 135–145)

## 2017-09-04 LAB — CBC
HEMATOCRIT: 36.4 % (ref 36.0–46.0)
HEMOGLOBIN: 11.8 g/dL — AB (ref 12.0–15.0)
MCH: 29.5 pg (ref 26.0–34.0)
MCHC: 32.4 g/dL (ref 30.0–36.0)
MCV: 91 fL (ref 78.0–100.0)
PLATELETS: 220 10*3/uL (ref 150–400)
RBC: 4 MIL/uL (ref 3.87–5.11)
RDW: 14.4 % (ref 11.5–15.5)
WBC: 7.3 10*3/uL (ref 4.0–10.5)

## 2017-09-04 LAB — GLUCOSE, CAPILLARY
GLUCOSE-CAPILLARY: 197 mg/dL — AB (ref 65–99)
GLUCOSE-CAPILLARY: 311 mg/dL — AB (ref 65–99)

## 2017-09-04 MED ORDER — HYDROCODONE-ACETAMINOPHEN 5-325 MG PO TABS: 1.0000 | ORAL_TABLET | Freq: Four times a day (QID) | ORAL | 0 refills | Status: DC | PRN

## 2017-09-04 MED ORDER — DOXERCALCIFEROL 4 MCG/2ML IV SOLN
INTRAVENOUS | Status: AC
  Filled 2017-09-04: qty 2

## 2017-09-04 MED ORDER — NEPRO/CARBSTEADY PO LIQD: 237.0000 mL | Freq: Two times a day (BID) | ORAL | 0 refills | Status: AC

## 2017-09-04 MED ORDER — RENA-VITE PO TABS: 1.0000 | ORAL_TABLET | Freq: Every day | ORAL | 0 refills | Status: AC

## 2017-09-04 MED ORDER — HYDROCODONE-ACETAMINOPHEN 5-325 MG PO TABS: 1.0000 | ORAL_TABLET | Freq: Four times a day (QID) | ORAL | 0 refills | Status: AC | PRN

## 2017-09-04 MED ORDER — PRO-STAT SUGAR FREE PO LIQD: 30.0000 mL | Freq: Two times a day (BID) | ORAL | 0 refills | Status: AC

## 2017-09-04 MED ORDER — PANTOPRAZOLE SODIUM 40 MG PO TBEC: 40.0000 mg | DELAYED_RELEASE_TABLET | Freq: Every day | ORAL | Status: AC

## 2017-09-04 MED ORDER — METHOCARBAMOL 500 MG PO TABS: 500.0000 mg | ORAL_TABLET | Freq: Four times a day (QID) | ORAL | Status: AC | PRN

## 2017-09-04 MED ORDER — POLYETHYLENE GLYCOL 3350 17 G PO PACK: 17.0000 g | PACK | Freq: Every day | ORAL | 0 refills | Status: AC | PRN

## 2017-09-04 MED ORDER — HEPARIN SODIUM (PORCINE) 1000 UNIT/ML DIALYSIS
1400.0000 [IU] | Freq: Once | INTRAMUSCULAR | Status: AC
  Administered 2017-09-04: 1400 [IU] via INTRAVENOUS_CENTRAL

## 2017-09-04 NOTE — Procedures (Signed)
For d/c today after dialysis. No new issues on dialysis.    I was present at this dialysis session, have reviewed the session itself and made  appropriate changes Vinson Moselle MD [facility] pager [phone]   09/04/2017, 2:19 PM

## 2017-09-04 NOTE — Telephone Encounter (Signed)
Rx faxed to [facility] (P) [phone], (F) [phone] 

## 2017-09-04 NOTE — Discharge Summary (Signed)
[facility]  Physician Discharge Summary   Patient ID: Frances Reeves MRN: [id number] DOB/AGE: [birth year] 67 y.o.  Admit date: 08/29/2017 Discharge date: 09/04/2017  PCP: Patient, No Pcp Per  DISCHARGE DIAGNOSES:  Left Pubic rami fracture  RECOMMENDATIONS FOR OUTPATIENT FOLLOW UP: 1. Continue hemodialysis as outpatient on a Tuesday Thursday Saturday schedule 2. Consider outpatient barium swallow once she is able to ambulate.  DISCHARGE CONDITION: fair  Diet recommendation: Carb modified renal  Filed Weights   09/03/17 2127 09/04/17 0715 09/04/17 1129  Weight: 43.6 kg (96 lb 1.1 oz) 44.6 kg (98 lb 5.2 oz) 43.6 kg (96 lb 1.9 oz)    INITIAL HISTORY: 67 year old female with a past medical history of end-stage renal disease on hemodialysis, noncompliance, diabetes mellitus type 2, anemia, GERD presented after mechanical fall at home.  She could not get up due to pain.  EMS was called.  Patient was found to have superior and inferior pubic rami fracture on the left.  Due to severe pain and inability to ambulate she was hospitalized.  Consultants: Nephrology  Procedures: Hemodialysis   HOSPITAL COURSE:   Left Pubic Ramus fracture, closed.  Case was discussed with orthopedics.  Pain control was recommended.  No role for surgical intervention.  Seen by physical and Occupational Therapy.  She is thought to benefit from short-term rehab.  Social worker consulted.  Medically stable for discharge when bed is available.     ESRD, on HD TTS  Patient noncompliant with her dialysis regimen.    Nephrology was consulted.  She was dialyzed in the hospital.  Dialyzed today.  Discussed with nephrology.  They are okay with her being discharged after diagnosis today.    Dysphagia Esophagogram showed tertiary contraction and esophagitis. Not accurate due to inability to stand up.  Study will need to be repeated when she is more mobile and able to be upright, perhaps in a few  weeks.  Continue PPI for now.  Seems to be tolerating her diet.  Could downgrade to soft diet if needed.  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  Monitor CBGs.  Overall stable.  Okay for discharge to skilled nursing facility.   PERTINENT LABS:  The results of significant diagnostics from this hospitalization (including imaging, microbiology, ancillary and laboratory) are listed below for reference.    Microbiology: Recent Results (from the past 240 hour(s))  MRSA PCR Screening     Status: None   Collection Time: 08/30/17 11:30 PM  Result Value Ref Range Status   MRSA by PCR NEGATIVE NEGATIVE Final    Comment:        The GeneXpert MRSA Assay (FDA approved for NASAL specimens only), is one component of a comprehensive MRSA colonization surveillance program. It is not intended to diagnose MRSA infection nor to guide or monitor treatment for MRSA infections. Performed at [facility], [address], [place], [place] [address]      Labs: Basic Metabolic Panel: Recent Labs  Lab 08/29/17 2240 08/30/17 0401 09/01/17 0726 09/03/17 0353 09/04/17 0751  NA 135 137 131* 133* 130*  K 5.1 5.7* 4.8 3.9 4.4  CL 94* 97* 89* 90* 88*  CO2 GLUCOSE 249* 321* 197* 276* 233*  BUN 65* 68* 54* 33* 66*  CREATININE 5.37* 5.55* 4.67* 3.45* 5.10*  CALCIUM 8.4* 8.3* 7.4* 8.1* 8.1*  PHOS  --   --  6.7*  --  4.5   Liver Function Tests:  Recent Labs  Lab 08/30/17 0401 09/01/17 0726 09/04/17 0751  ALBUMIN 3.2* 2.9* 2.7*   CBC: Recent Labs  Lab 08/29/17 2240 08/30/17 0401 09/01/17 0726 09/03/17 0353 09/04/17 0751  WBC 6.0 6.6 4.2 7.9 7.3  NEUTROABS 4.4  --   --   --   --   HGB 13.5 12.8 11.6* 12.6 11.8*  HCT 40.5 39.4 35.5* 38.5 36.4  MCV 92.3 92.7 92.9 90.8 91.0  PLT 197 177 168 213 220    CBG: Recent Labs  Lab 09/02/17 2027 09/03/17 0742 09/03/17 1145 09/03/17 1654 09/03/17 2127  GLUCAP 157* 209* 169*  266* 211*     IMAGING STUDIES Dg Lumbar Spine Complete  Result Date: 08/29/2017 CLINICAL DATA:  Recent fall with low back pain, initial encounter EXAM: LUMBAR SPINE - COMPLETE 4+ VIEW COMPARISON:  04/18/2006 FINDINGS: Five lumbar type vertebral bodies are well visualized. Vertebral body height is well maintained. No pars defects are seen. No compression deformity is noted. No anterolisthesis is seen. No definitive soft tissue abnormality is noted. The known pubic rami fractures are not well appreciated on this exam. IMPRESSION: No acute abnormality in the lumbar spine. Electronically Signed   By: Alcide Clever M.D.   On: 08/29/2017 22:30   Dg Esophagus  Result Date: 08/31/2017 CLINICAL DATA:  Dysphagia, difficulty swallowing food and liquids. EXAM: ESOPHOGRAM/BARIUM SWALLOW TECHNIQUE: Single contrast examination was performed using  thin barium. FLUOROSCOPY TIME:  Fluoroscopy Time:  1 minutes, 24 seconds Radiation Exposure Index (if provided by the fluoroscopic device): 8.0 mGy Number of Acquired Spot Images: 0 COMPARISON:  None. FINDINGS: The patient is relatively frail and accordingly the standard esophagram with standing, turning, carefully timed double contrast swallowing, and extensive maneuvering on the table was not feasible. Instead, single swallows were performed with patient in the LPO position. There were occasional tertiary contractions in the esophagus as on image 10/3. Primary peristaltic waves in the esophagus were preserved on 3/4 swallows. As shown for example on image 19/1 and image 22/2, there is some slight narrowing of the thoracic esophagus distally which is smoothly marginated. This appears somewhat pulsatile, suggesting that the narrowing is probably due to esophageal compression between the enlarged heart and the spine. Mild smooth fold thickening in this segment of the esophagus is suggested on image 32/2. The patient swallowed a 13 mm barium pill which passed into the stomach  without delay. Note is also made of cardiomegaly and atherosclerotic calcification of the aortic arch. IMPRESSION: 1. Smooth, likely extrinsic narrowing of the esophagus adjacent to the enlarged heart. This seems pulsatile and I suspect that the appearance is primarily due to the esophageal positioning between the enlarged heart and spine. Mild fold thickening of the esophagus in this vicinity suggests mild-to-moderate esophagitis. 2. Scattered tertiary contractions in the esophagus but primary peristaltic waves were preserved on 3/4 swallows. 3. Please note that due to patient frailty the typical protocol for esophagram could not be performed, and today's images were obtained with the patient laying down in the LPO position. This reduces the exams sensitivity and specificity compared to the normal comprehensive esophagram performed on patients able to stand, turn, and follow complex swallowing instructions. 4. Atherosclerotic calcification of the aortic arch. Electronically Signed   By: Gaylyn Rong M.D.   On: 08/31/2017 08:42   Dg Chest Port 1 View  Result Date: 08/30/2017 CLINICAL DATA:  Patient fell around 1300 hours today. Bilateral hip and back pain. EXAM: PORTABLE CHEST 1 VIEW COMPARISON:  05/13/2017 FINDINGS: Cardiomegaly  with aortic atherosclerosis unchanged in appearance. Subsegmental atelectasis and/or scarring is seen along the periphery of the left lung base and to a lesser degree the right lower lobe. Clearing of bilateral pleural effusions since prior study. Chronic interstitial lung disease without of bili consolidation or CHF. 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 No acute displaced rib fracture. IMPRESSION: Stable cardiomegaly with aortic atherosclerosis. Chronic interstitial disease with subsegmental atelectasis and/or scarring within both lower lobes, left greater than right. Electronically Signed   By: Tollie Eth M.D.   On: 08/30/2017 01:50   Dg Hips Bilat  With Pelvis 2v  Result Date: 08/29/2017 CLINICAL DATA:  Recent fall with hip pain, initial encounter EXAM: DG HIP (WITH OR WITHOUT PELVIS) 2V BILAT COMPARISON:  None. FINDINGS: The proximal femurs are within normal limits. There is a fracture through the superior pubic ramus on the left with only minimal displacement. Mild irregularity of the inferior pubic ramus on the left is noted as well consistent with an undisplaced fracture. No other definitive fracture is seen. No soft tissue changes are noted. IMPRESSION: Fractures through the superior and inferior pubic rami on the left. Electronically Signed   By: Alcide Clever M.D.   On: 08/29/2017 22:29    DISCHARGE EXAMINATION: Vitals:   09/04/17 1100 09/04/17 1126 09/04/17 1129 09/04/17 1156  BP: (!) 146/54 (!) 131/55 (!) 122/52 (!) 133/42  Pulse: 91 85 84 86  Resp: Temp:   98 F (36.7 C) 98.3 F (36.8 C)  TempSrc:   Oral Oral  SpO2:   95% 96%  Weight:   43.6 kg (96 lb 1.9 oz)   Height:       General appearance: alert, cooperative, appears stated age and no distress Resp: clear to auscultation bilaterally Cardio: regular rate and rhythm, S1, S2 normal, no murmur, click, rub or gallop GI: soft, non-tender; bowel sounds normal; no masses,  no organomegaly  DISPOSITION: SNF  Discharge Instructions    Call MD for:  extreme fatigue   Complete by:  As directed    Call MD for:  persistant dizziness or light-headedness   Complete by:  As directed    Call MD for:  persistant nausea and vomiting   Complete by:  As directed    Call MD for:  severe uncontrolled pain   Complete by:  As directed    Call MD for:  temperature >100.4   Complete by:  As directed    Discharge instructions   Complete by:  As directed    Please review instructions on the discharge summary.  You were cared for by a hospitalist during your hospital stay. If you have any questions about your discharge medications or the care you received while you were in  the hospital after you are discharged, you can call the unit and asked to speak with the hospitalist on call if the hospitalist that took care of you is not available. Once you are discharged, your primary care physician will handle any further medical issues. Please note that NO REFILLS for any discharge medications will be authorized once you are discharged, as it is imperative that you return to your primary care physician (or establish a relationship with a primary care physician if you do not have one) for your aftercare needs so that they can reassess your need for medications and monitor your lab values. If you do not have a primary care physician, you can call [phone] for a physician referral.  Increase activity slowly   Complete by:  As directed         Allergies as of 09/04/2017   No Known Allergies     Medication List    TAKE these medications   acetaminophen 650 MG CR tablet Commonly known as:  TYLENOL Take 650 mg by mouth every 8 (eight) hours as needed for pain.   calcium acetate 667 MG capsule Commonly known as:  PHOSLO Take 667 mg by mouth 3 (three) times daily with meals.   feeding supplement (NEPRO CARB STEADY) Liqd Take 237 mLs by mouth 2 (two) times daily between meals.   feeding supplement (PRO-STAT SUGAR FREE 64) Liqd Take 30 mLs by mouth 2 (two) times daily.   glimepiride 4 MG tablet Commonly known as:  AMARYL Take 4 mg by mouth daily.   HYDROcodone-acetaminophen 5-325 MG tablet Commonly known as:  NORCO/VICODIN Take 1-2 tablets by mouth every 6 (six) hours as needed for up to 5 days for moderate pain.   methocarbamol 500 MG tablet Commonly known as:  ROBAXIN Take 1 tablet (500 mg total) by mouth every 6 (six) hours as needed for muscle spasms.   multivitamin Tabs tablet Take 1 tablet by mouth at bedtime.   pantoprazole 40 MG tablet Commonly known as:  PROTONIX Take 1 tablet (40 mg total) by mouth daily.   polyethylene glycol packet Commonly  known as:  MIRALAX / GLYCOLAX Take 17 g by mouth daily as needed for mild constipation.        TOTAL DISCHARGE TIME: 35 mins  Osvaldo Shipper  [facility] Pager [phone]  09/04/2017, 12:04 PM

## 2017-09-04 NOTE — Progress Notes (Signed)
Inpatient Diabetes Program Recommendations  AACE/ADA: New Consensus Statement on Inpatient Glycemic Control (2019)  Target Ranges:  Prepandial:   less than 140 mg/dL      Peak postprandial:   less than 180 mg/dL (1-2 hours)      Critically ill patients:  140 - 180 mg/dL  Results for Frances Reeves, Frances Reeves (MRN [id number]) as of 09/04/2017 09:21  Ref. Range 09/04/2017 07:51  Glucose Latest Ref Range: 65 - 99 mg/dL 409 (H)   Results for Frances Reeves, Frances Reeves (MRN [id number]) as of 09/04/2017 09:21  Ref. Range 09/03/2017 07:42 09/03/2017 11:45 09/03/2017 16:54 09/03/2017 21:27  Glucose-Capillary Latest Ref Range: 65 - 99 mg/dL 956 (H)  Novolog 3 units 169 (H)  Novolog 2 units 266 (H)  Novolog 5 units 211 (H)  Novolog 2 units   Results for Frances Reeves, Frances Reeves (MRN [id number]) as of 09/03/2017 10:09  Ref. Range 09/02/2017 08:00 09/02/2017 12:53 09/02/2017 16:56 09/02/2017 20:27 09/03/2017 07:42  Glucose-Capillary Latest Ref Range: 65 - 99 mg/dL 469 (H)   629 (H)  Novolog 3 units 329 (H)  Novolog 7 units 157 (H) 209 (H)   Review of Glycemic Control  Diabetes history: DM2 Outpatient Diabetes medications: Amaryl 4 mg daily Current orders for Inpatient glycemic control: Novolog 0-9 units TID with meals, Novolog 0-5 units QHS  Inpatient Diabetes Program Recommendations: Insulin - Basal: Glucose ranged from 169 to 266 mg/dl over the past 24 hours and fasting glucose 233 mg/dl today. Please consider ordering Levemir 5 units Q24H.  Thanks, Orlando Penner, RN, MSN, CDE Diabetes Coordinator Inpatient Diabetes Program [phone] (Team Pager from 8am to 5pm)

## 2017-09-04 NOTE — Progress Notes (Signed)
Patient discharged to [facility],transported by [facility]. All belongings sent with patient. Frances Reeves, [name], Charity fundraiser

## 2017-09-04 NOTE — Clinical Social Work Placement (Signed)
   CLINICAL SOCIAL WORK PLACEMENT  NOTE 09/04/17 - DISCHARGED TO [facility]IA AMBULANCE  Date:  09/04/2017  Patient Details  Name: MARCHETA HORSEY MRN: [id number] Date of Birth: Sep 26, 1950  Clinical Social Work is seeking post-discharge placement for this patient at the Skilled  Nursing Facility level of care (*CSW will initial, date and re-position this form in  chart as items are completed):  Yes   Patient/family provided with [place] Clinical Social Work Department's list of facilities offering this level of care within the geographic area requested by the patient (or if unable, by the patient's family).  Yes   Patient/family informed of their freedom to choose among providers that offer the needed level of care, that participate in Medicare, Medicaid or managed care program needed by the patient, have an available bed and are willing to accept the patient.  Yes   Patient/family informed of [facility]'s ownership interest in [facility] : [facility] and [facility], as well as of the fact that they are under no obligation to receive care at these facilities.  PASRR submitted to EDS on 09/03/17     PASRR number received on 09/03/17     Existing PASRR number confirmed on       FL2 transmitted to all facilities in geographic area requested by pt/family on 09/03/17     FL2 transmitted to all facilities within larger geographic area on       Patient informed that his/her managed care company has contracts with or will negotiate with certain facilities, including the following:        Yes   Patient/family informed of bed offers received.  Patient chooses bed at   [facility]   Physician recommends and patient chooses bed at      Patient to be transferred to  [facility] on 09/04/17.  Patient to be transferred to facility by Ambulance     Patient family notified on 09/11/17 of transfer.  Name of family member notified:  Daughter [name] by phone      PHYSICIAN       Additional Comment:    _______________________________________________ Cristobal Goldmann, LCSW 09/04/2017, 6:17 PM

## 2017-09-04 NOTE — Clinical Social Work Note (Signed)
Clinical Social Work Assessment  Patient Details  Name: Frances Reeves MRN: [id number] Date of Birth: 01/12/1951  Date of referral:  09/02/17               Reason for consult:  Facility Placement, Discharge Planning                Permission sought to share information with:  Family Supports Permission granted to share information::  Yes, Verbal Permission Granted  Name::     Frances Reeves  Agency::     Relationship::  Daughter  Contact Information:  [phone]  Housing/Transportation Living arrangements for the past 2 months:  Apartment Source of Information:  Patient, Adult Children(Daughter Frances Reeves) Patient Interpreter Needed:  Spanish(Used [employer] Video interpreting Service) Criminal Activity/Legal Involvement Pertinent to Current Situation/Hospitalization:  No - Comment as needed Significant Relationships:  Adult Children Lives with:  Self Do you feel safe going back to the place where you live?  No(Patient and daughter in agreement with rehab) Need for family participation in patient care:  Yes (Comment)  Care giving concerns:  Patient lives alone and was agreeable to rehab before returning home. Daughter also expressed concern as she works long hours and would not be able to be with her mother.  Social Worker assessment / plan: CSW talked patient initially on 5/15 at the beside, using [employer] Interpreting Service. Patient reported that she lives alone and 3 children work. Patient reported that one child lives in [place] one lives in [place] and [place] lives outside of [place]. She indicated that her daughter Frances Reeves works all day (Leaves home at 6 am and gets back home around 10 or 11 pm), however she gave CSW permission to call her and leave a message if necessary. When asked, Ms. Shreffler responded that she has glaucoma or something like that but does not wear glasses, and she also does not use hearing aids.   CSW explained the recommendation for ST  rehab and wht this entails, and when asked patient responded that she has never been to rehab before. CSW left list for patient's daughter and informed her that her daughter would be contacted.  On 5/16, CSW visited room and daughter was with patient. CSW talked with Ms. Moreno about ST rehab, and the facility search process and she expressed agreement. Daughter talked with CSW about her job with [employer] and the long hours she works. She also requested a letter for her job regarding her visit to hospital and this was provided to her. Daughter was given SNF list and provided CSW with her facility preferences.  Employment status:  Retired Health and safety inspector:  Medicare PT Recommendations:  Skilled Nursing Facility Information / Referral to community resources:  Skilled Nursing Facility(SNF left with patient and later given to daughter while talking with patient/daughter at the bedside)  Patient/Family's Response to care:  Patient and daughter did not express any concerns regarding her care during hospitalization.  Patient/Family's Understanding of and Emotional Response to Diagnosis, Current Treatment, and Prognosis: Daughter in agreement that patient needs rehab before going home as she lives alone.  Emotional Assessment Appearance:  Appears stated age Attitude/Demeanor/Rapport:  Engaged Affect (typically observed):  Appropriate, Pleasant Orientation:    Alcohol / Substance use:  Never Used Psych involvement (Current and /or in the community):  No (Comment)  Discharge Needs  Concerns to be addressed:  Discharge Planning Concerns Readmission within the last 30 days:  No Current discharge risk:  None Barriers to Discharge:  No Barriers Identified   Frances Goldmann, LCSW 09/04/2017, 6:03 PM

## 2017-09-07 ENCOUNTER — Non-Acute Institutional Stay (SKILLED_NURSING_FACILITY): Payer: Medicare Other | Admitting: Adult Health

## 2017-09-07 ENCOUNTER — Encounter: Payer: Self-pay | Admitting: Adult Health

## 2017-09-07 DIAGNOSIS — T402X5A Adverse effect of other opioids, initial encounter: Secondary | ICD-10-CM

## 2017-09-07 DIAGNOSIS — E1165 Type 2 diabetes mellitus with hyperglycemia: Secondary | ICD-10-CM

## 2017-09-07 DIAGNOSIS — Z992 Dependence on renal dialysis: Secondary | ICD-10-CM | POA: Diagnosis not present

## 2017-09-07 DIAGNOSIS — I5032 Chronic diastolic (congestive) heart failure: Secondary | ICD-10-CM

## 2017-09-07 DIAGNOSIS — S32592S Other specified fracture of left pubis, sequela: Secondary | ICD-10-CM

## 2017-09-07 DIAGNOSIS — IMO0002 Reserved for concepts with insufficient information to code with codable children: Secondary | ICD-10-CM

## 2017-09-07 DIAGNOSIS — K5903 Drug induced constipation: Secondary | ICD-10-CM | POA: Diagnosis not present

## 2017-09-07 DIAGNOSIS — N186 End stage renal disease: Secondary | ICD-10-CM

## 2017-09-07 DIAGNOSIS — E1122 Type 2 diabetes mellitus with diabetic chronic kidney disease: Secondary | ICD-10-CM

## 2017-09-07 DIAGNOSIS — K219 Gastro-esophageal reflux disease without esophagitis: Secondary | ICD-10-CM | POA: Diagnosis not present

## 2017-09-07 NOTE — Progress Notes (Signed)
Location:   [facility] Room Number: 125 A Place of Service:  SNF (31)   CODE STATUS: Full Code  No Known Allergies  Chief Complaint  Patient presents with  . Hospitalization Follow-up    Hospital follow up    HPI:  She is a 67 year old woman with history of ESRD and diabetes; who suffered a mechanical fall at home. She was found to have a pubic fracture. She was treated with conservative treatment and pain management. She is here for short tern rehab with her goal to return back home. She is unable to fully participate in the hpi or ros; but did indicated that she is having difficulty with urination and constipation; she does have pelvic pain. There are no nursing concerns. She will continue to be followed for her chronic illnesses including: diastolic heart failure; diabetes and esrd.    Past Medical History:  Diagnosis Date  . Anemia   . Chronic kidney disease   . Diabetes mellitus without complication (HCC)   . GERD (gastroesophageal reflux disease)   . Stroke [facility])    in her 66's    Past Surgical History:  Procedure Laterality Date  . APPENDECTOMY    . BASCILIC VEIN TRANSPOSITION Left 11/24/2016   Procedure: FIRST STAGE BASCILIC VEIN TRANSPOSITION;  Surgeon: Maeola Harman, MD;  Location: [facility] OR;  Service: Vascular;  Laterality: Left;  . EYE SURGERY Bilateral    cataract surgery with lens implant  . INSERTION OF DIALYSIS CATHETER Right 11/24/2016   Procedure: INSERTION OF TUNNELED DIALYSIS CATHETER;  Surgeon: Maeola Harman, MD;  Location: [facility] OR;  Service: Vascular;  Laterality: Right;    Social History   Socioeconomic History  . Marital status: Widowed    Spouse name: Not on file  . Number of children: Not on file  . Years of education: Not on file  . Highest education level: Not on file  Occupational History  . Not on file  Social Needs  . Financial resource strain: Not on file  . Food insecurity:    Worry: Not on file    Inability: Not on file  . Transportation needs:    Medical: Not on file    Non-medical: Not on file  Tobacco Use  . Smoking status: Never Smoker  . Smokeless tobacco: Never Used  Substance and Sexual Activity  . Alcohol use: No  . Drug use: No  . Sexual activity: Not on file  Lifestyle  . Physical activity:    Days per week: Not on file    Minutes per session: Not on file  . Stress: Not on file  Relationships  . Social connections:    Talks on phone: Not on file    Gets together: Not on file    Attends religious service: Not on file    Active member of club or organization: Not on file    Attends meetings of clubs or organizations: Not on file    Relationship status: Not on file  . Intimate partner violence:    Fear of current or ex partner: Not on file    Emotionally abused: Not on file    Physically abused: Not on file    Forced sexual activity: Not on file  Other Topics Concern  . Not on file  Social History Narrative  . Not on file   Family History  Problem Relation Age of Onset  . Diabetes Other   . CAD Neg Hx   .  Cancer Neg Hx   . Renal Disease Neg Hx       VITAL SIGNS BP 105/76   Pulse 76   Temp 98.3 F (36.8 C)   Resp 18   Ht  (1.473 m)   Wt 96 lb (43.5 kg)   SpO2 97%   BMI 20.06 kg/m   Outpatient Encounter Medications as of 09/07/2017  Medication Sig  . acetaminophen (TYLENOL) 650 MG CR tablet Take 650 mg by mouth every 8 (eight) hours as needed for pain.  . Amino Acids-Protein Hydrolys (FEEDING SUPPLEMENT, PRO-STAT SUGAR FREE 64,) LIQD Take 30 mLs by mouth 2 (two) times daily.  . calcium acetate (PHOSLO) 667 MG capsule Take 667 mg by mouth 3 (three) times daily with meals.  Marland Kitchen glimepiride (AMARYL) 4 MG tablet Take 4 mg by mouth daily.  Marland Kitchen HYDROcodone-acetaminophen (NORCO/VICODIN) 5-325 MG tablet Take 1-2 tablets by mouth every 6 (six) hours as needed for up to 5 days for moderate pain.  . methocarbamol (ROBAXIN) 500 MG tablet Take 1 tablet  (500 mg total) by mouth every 6 (six) hours as needed for muscle spasms.  . multivitamin (RENA-VIT) TABS tablet Take 1 tablet by mouth at bedtime.  . Nutritional Supplements (FEEDING SUPPLEMENT, NEPRO CARB STEADY,) LIQD Take 237 mLs by mouth 2 (two) times daily between meals.  . pantoprazole (PROTONIX) 40 MG tablet Take 1 tablet (40 mg total) by mouth daily.  . polyethylene glycol (MIRALAX / GLYCOLAX) packet Take 17 g by mouth daily as needed for mild constipation.   No facility-administered encounter medications on file as of 09/07/2017.      SIGNIFICANT DIAGNOSTIC EXAMS  TODAY:  08-29-17 :bilateral hip and pelvis x-ray: Fractures through the superior and inferior pubic rami on the left.   08-29-17: lumbar spine x-ray: No acute abnormality in the lumbar spine.   08-30-17: chest x-ray: Stable cardiomegaly with aortic atherosclerosis. Chronic interstitial disease with subsegmental atelectasis and/or scarring within both lower lobes, left greater than right.  08-31-17: esophagram/ barium swallow: 1. Smooth, likely extrinsic narrowing of the esophagus adjacent to the enlarged heart. This seems pulsatile and I suspect that the appearance is primarily due to the esophageal positioning between the enlarged heart and spine. Mild fold thickening of the esophagus in this vicinity suggests mild-to-moderate esophagitis. 2. Scattered tertiary contractions in the esophagus but primary peristaltic waves were preserved on 3/4 swallows. 3. Please note that due to patient frailty the typical protocol for esophagram could not be performed, and today's images were obtained with the patient laying down in the LPO position. This reduces the exams sensitivity and specificity compared to the normal comprehensive esophagram performed on patients able to stand, turn, and follow complex swallowing instructions. 4. Atherosclerotic calcification of the aortic arch.    LABS REVIEWED: TODAY:   08-29-17: wbc 6.0; hgb 13.5;  hct 40.5; mcv 92.3; plt 197; glucose 249; bun 65; creat 5.37; k+ 5.1; na++ 135; ca 8.4 08-30-17: albumin 3.2; vit D 29.3; hgb a1c 10.2 09-01-17: wbc 4.2; hgb 11.6; hct 35.5; mcv 92.9; plt 168; glucose 197; bun 54; creat 4.67; k+ 4.8; na++ 131; ca 7.4; phos 6.7; albumin 2.9 09-04-17: wbc 7.3; hgb 11.8; hct 36.4; mcv 91.0 ;plt 220; glucose 233; bun 66; creat 5.10 ;k+ 4.4; na++ 131; ca 8.1; phos 4.5; albumin 2.7    Review of Systems  Unable to perform ROS: Other (language barrier)    Physical Exam  Constitutional: No distress.  caachexia  Neck: No thyromegaly present.  Cardiovascular: Normal  rate, regular rhythm and intact distal pulses.  Murmur heard. 1/6  Pulmonary/Chest: Effort normal and breath sounds normal. No respiratory distress.  Abdominal: Soft. Bowel sounds are normal. She exhibits no distension. There is no tenderness.  Musculoskeletal: She exhibits no edema.  Able to move upper extremities Did not move lower extremities   Lymphadenopathy:    She has no cervical adenopathy.  Neurological: She is alert.  Skin: Skin is warm and dry. She is not diaphoretic.  Psychiatric: She has a normal mood and affect.      ASSESSMENT/ PLAN:  TODAY:   1.  Chronic diastolic heart failure stable: EF 55-60% (02-08-17): will not make changes will monitor her status.   2. GERD without esophagitis: stable will continue protonix 40 mg daily   3. 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   4. Left closed pubic ramus fracture: without change: will follow up with orthopedics as indicated will continue therapy as directed; has vicodin 5/325 mg 1 or 2 tabs every 6 hours as needed for pain and has robaxin 500 mg every 6 hours as needed for spasms  5. 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 Will continue phoslo 667 mg three times daily is followed by nephrology  Will I/O cath every 8 hours as needed    6. Constipation due to opioid therapy: is worse; will give dulcolax supp now and will change miralax to 17 gm daily     MD is aware of resident's narcotic use and is in agreement with current plan of care. We will attempt to wean resident as apropriate   Synthia Innocent NP [facility]  Contact [phone] Monday through Friday 8am- 5pm  After hours call [phone]

## 2017-09-10 ENCOUNTER — Non-Acute Institutional Stay (SKILLED_NURSING_FACILITY): Payer: Medicare Other | Admitting: Internal Medicine

## 2017-09-10 ENCOUNTER — Encounter: Payer: Self-pay | Admitting: Internal Medicine

## 2017-09-10 DIAGNOSIS — E1122 Type 2 diabetes mellitus with diabetic chronic kidney disease: Secondary | ICD-10-CM | POA: Diagnosis not present

## 2017-09-10 DIAGNOSIS — IMO0002 Reserved for concepts with insufficient information to code with codable children: Secondary | ICD-10-CM

## 2017-09-10 DIAGNOSIS — R5381 Other malaise: Secondary | ICD-10-CM

## 2017-09-10 DIAGNOSIS — N186 End stage renal disease: Secondary | ICD-10-CM | POA: Diagnosis not present

## 2017-09-10 DIAGNOSIS — E1165 Type 2 diabetes mellitus with hyperglycemia: Secondary | ICD-10-CM

## 2017-09-10 DIAGNOSIS — S32592S Other specified fracture of left pubis, sequela: Secondary | ICD-10-CM

## 2017-09-10 DIAGNOSIS — Z992 Dependence on renal dialysis: Secondary | ICD-10-CM

## 2017-09-10 NOTE — Progress Notes (Signed)
Patient ID: Frances Reeves, female   DOB: May 05, 1950, 67 y.o.   MRN: [id number]  Provider:  DR Elmon Kirschner Location:  [facility] Room Number: 125 A Place of Service:  SNF (31)  PCP: Patient, No Pcp Per Patient Care Team: Patient, No Pcp Per as PCP - General (General Practice)  Extended Emergency Contact Information Primary Emergency Contact: Hoyle Sauer [place] Phone: [phone] Relation: Daughter Secondary Emergency Contact: Aleman,Carlos Address: [address]          [place], [place] [address] [place] Home Phone: [phone] Relation: Son  Code Status: Full Code Goals of Care: Advanced Directive information Advanced Directives 09/10/2017  Does Patient Have a Medical Advance Directive? No  Would patient like information on creating a medical advance directive? No - Patient declined      Chief Complaint  Patient presents with  . New Admit To SNF    Admission    HPI: Patient is a 67 y.o. female seen today for admission to SNF following hospital stay for closed left pubic ramus fx, dysphagia, ESRD/HD TThSa, DM type 2. She presented to the ED form home following mechanical fall. xrays revealed left superior and inferior pubic rami fx. Albumin 3.2; Vit D 25OH level 29.3; Hgb 11.8; Plts 301K; Mg 2.7. She presents to SNF for short term rehab.  Today she reports pain well controlled on norco 5/325 mg 1-2 tabs every 6 hours as needed for pain; robaxin 500 mg every 6 hours as needed for spasms. She is tolerating tx. She speaks very little [place].   Chronic diastolic heart failure stable - EF 55-60% (02-08-17); managed by HD  GERD/dysphagia - stable on protonix 40 mg daily   DM - uncontrolled. A1c 10.2%; she takes amaryl 4 mg daily; recently started on lantus 10 units nightly and novolog 5 units after meals for cbg >=150. CBGs improved 120-190s; rarely >300 or < 120. No low BS reactions  ESRD - HD TThSa  via left arm AVF. She takes phoslo 667 mg three times daily; followed by nephrology. She still makes urine; Vitamin D 25OH level 29.3  Constipation due to opioid therapy - stable on dulcolax supp;  miralax to 17 gm daily   Past Medical History:  Diagnosis Date  . Anemia   . Chronic kidney disease   . Diabetes mellitus without complication (HCC)   . GERD (gastroesophageal reflux disease)   . Stroke [facility])    in her 4's   Past Surgical History:  Procedure Laterality Date  . APPENDECTOMY    . BASCILIC VEIN TRANSPOSITION Left 11/24/2016   Procedure: FIRST STAGE BASCILIC VEIN TRANSPOSITION;  Surgeon: Maeola Harman, MD;  Location: [facility] OR;  Service: Vascular;  Laterality: Left;  . EYE SURGERY Bilateral    cataract surgery with lens implant  . INSERTION OF DIALYSIS CATHETER Right 11/24/2016   Procedure: INSERTION OF TUNNELED DIALYSIS CATHETER;  Surgeon: Maeola Harman, MD;  Location: [facility] OR;  Service: Vascular;  Laterality: Right;    reports that she has never smoked. She has never used smokeless tobacco. She reports that she does not drink alcohol or use drugs. Social History   Socioeconomic History  . Marital status: Widowed    Spouse name: Not on file  . Number of children: Not on file  . Years of education: Not on file  . Highest education level: Not on file  Occupational History  . Not on file  Social Needs  .  Financial resource strain: Not on file  . Food insecurity:    Worry: Not on file    Inability: Not on file  . Transportation needs:    Medical: Not on file    Non-medical: Not on file  Tobacco Use  . Smoking status: Never Smoker  . Smokeless tobacco: Never Used  Substance and Sexual Activity  . Alcohol use: No  . Drug use: No  . Sexual activity: Not on file  Lifestyle  . Physical activity:    Days per week: Not on file    Minutes per session: Not on file  . Stress: Not on file  Relationships  . Social connections:    Talks on phone: Not on  file    Gets together: Not on file    Attends religious service: Not on file    Active member of club or organization: Not on file    Attends meetings of clubs or organizations: Not on file    Relationship status: Not on file  . Intimate partner violence:    Fear of current or ex partner: Not on file    Emotionally abused: Not on file    Physically abused: Not on file    Forced sexual activity: Not on file  Other Topics Concern  . Not on file  Social History Narrative  . Not on file    Functional Status Survey:    Family History  Problem Relation Age of Onset  . Diabetes Other   . CAD Neg Hx   . Cancer Neg Hx   . Renal Disease Neg Hx     Health Maintenance  Topic Date Due  . FOOT EXAM  09/08/2018 (Originally 05/29/1960)  . MAMMOGRAM  09/08/2018 (Originally 05/29/1998)  . OPHTHALMOLOGY EXAM  09/08/2018 (Originally 05/29/1960)  . URINE MICROALBUMIN  09/08/2018 (Originally 05/29/1960)  . DEXA SCAN  09/08/2018 (Originally 05/29/2013)  . COLONOSCOPY  09/08/2018 (Originally 05/29/1998)  . Hepatitis C Screening  09/08/2018 (Originally 30-Aug-1950)  . INFLUENZA VACCINE  11/19/2017  . HEMOGLOBIN A1C  03/02/2018  . PNA vac Low Risk Adult (2 of 2 - PCV13) 05/17/2018  . TETANUS/TDAP  Discontinued    No Known Allergies  Outpatient Encounter Medications as of 09/10/2017  Medication Sig  . acetaminophen (TYLENOL) 650 MG CR tablet Take 650 mg by mouth every 8 (eight) hours as needed for pain.  . Amino Acids-Protein Hydrolys (FEEDING SUPPLEMENT, PRO-STAT SUGAR FREE 64,) LIQD Take 30 mLs by mouth 2 (two) times daily.  . calcium acetate (PHOSLO) 667 MG capsule Take 667 mg by mouth 3 (three) times daily with meals.  Marland Kitchen glimepiride (AMARYL) 4 MG tablet Take 4 mg by mouth daily.  . insulin aspart (NOVOLOG FLEXPEN) 100 UNIT/ML FlexPen Inject 5 Units into the skin 3 (three) times daily after meals.  . Insulin Glargine (LANTUS SOLOSTAR) 100 UNIT/ML Solostar Pen Inject 10 Units into the skin every evening.    . methocarbamol (ROBAXIN) 500 MG tablet Take 1 tablet (500 mg total) by mouth every 6 (six) hours as needed for muscle spasms.  . multivitamin (RENA-VIT) TABS tablet Take 1 tablet by mouth at bedtime.  . Nutritional Supplements (FEEDING SUPPLEMENT, NEPRO CARB STEADY,) LIQD Take 237 mLs by mouth 2 (two) times daily between meals.  . pantoprazole (PROTONIX) 40 MG tablet Take 1 tablet (40 mg total) by mouth daily.  . polyethylene glycol (MIRALAX / GLYCOLAX) packet Take 17 g by mouth daily as needed for mild constipation.   No facility-administered encounter medications on  file as of 09/10/2017.     Review of Systems  Musculoskeletal: Positive for gait problem.  All other systems reviewed and are negative.   Vitals:   09/10/17 0855  BP: (!) 102/56  Pulse: 66  Resp: 18  Temp: 98.3 F (36.8 C)  SpO2: 97%  Weight: 100 lb 4.8 oz (45.5 kg)  Height:  (1.473 m)   Body mass index is 20.96 kg/m. Physical Exam  Constitutional: She is oriented to person, place, and time. She appears well-developed and well-nourished.  Frail appearing in NAD, lying in bed  HENT:  Mouth/Throat: Oropharynx is clear and moist. No oropharyngeal exudate.  MMM; no oral thrush  Eyes: Pupils are equal, round, and reactive to light. No scleral icterus.  Neck: Neck supple. Carotid bruit is not present. No tracheal deviation present. No thyromegaly present.  Cardiovascular: Normal rate, regular rhythm and intact distal pulses. Exam reveals no gallop and no friction rub.  Murmur (1/6 SEM) heard. Left arm AVF with palpable thrill/audible bruit. No LE edema b/l. No calf TTP  Pulmonary/Chest: Effort normal and breath sounds normal. No stridor. No respiratory distress. She has no wheezes. She has no rales.  Abdominal: Soft. Normal appearance and bowel sounds are normal. She exhibits no distension and no mass. There is no hepatomegaly. There is no tenderness. There is no rigidity, no rebound and no guarding. No hernia.   Musculoskeletal: She exhibits edema and tenderness (left pubic bone).  FROM toes  Lymphadenopathy:    She has no cervical adenopathy.  Neurological: She is alert and oriented to person, place, and time. She has normal reflexes.  Skin: Skin is warm and dry. No rash noted.  Psychiatric: She has a normal mood and affect. Her behavior is normal. Judgment and thought content normal.    Labs reviewed: Basic Metabolic Panel: Recent Labs    05/17/17 0442  09/01/17 0726 09/03/17 0353 09/04/17 0751  NA 136   < > 131* 133* 130*  K 4.2   < > 4.8 3.9 4.4  CL 95*   < > 89* 90* 88*  CO2 26   < > GLUCOSE 166*   < > 197* 276* 233*  BUN 40*   < > 54* 33* 66*  CREATININE 3.21*   < > 4.67* 3.45* 5.10*  CALCIUM 8.5*   < > 7.4* 8.1* 8.1*  MG 2.7*  --   --   --   --   PHOS 3.8  --  6.7*  --  4.5   < > = values in this interval not displayed.   Liver Function Tests: Recent Labs    02/02/17 1611  08/30/17 0401 09/01/17 0726 09/04/17 0751  AST 24  --   --   --   --   ALT 25  --   --   --   --   ALKPHOS 592*  --   --   --   --   BILITOT 0.3  --   --   --   --   PROT 7.4  --   --   --   --   ALBUMIN 2.5*   < > 3.2* 2.9* 2.7*   < > = values in this interval not displayed.   No results for input(s): LIPASE, AMYLASE in the last 8760 hours. No results for input(s): AMMONIA in the last 8760 hours. CBC: Recent Labs    05/13/17 2018 05/14/17 0210  08/29/17 2240  09/01/17 0726 09/03/17  7564 09/04/17 0751  WBC 9.1 7.7   < > 6.0   < > 4.2 7.9 7.3  NEUTROABS 7.0 6.2  --  4.4  --   --   --   --   HGB 12.6 11.6*   < > 13.5   < > 11.6* 12.6 11.8*  HCT 40.2 36.7   < > 40.5   < > 35.5* 38.5 36.4  MCV 87.8 87.0   < > 92.3   < > 92.9 90.8 91.0  PLT 301 262   < > 197   < > 168 213 220   < > = values in this interval not displayed.   Cardiac Enzymes: Recent Labs    02/07/17 1230  TROPONINI <0.03   BNP: Invalid input(s): POCBNP Lab Results  Component Value Date   HGBA1C 10.2 (H)  08/30/2017   No results found for: TSH No results found for: VITAMINB12 No results found for: FOLATE No results found for: IRON, TIBC, FERRITIN  Imaging and Procedures obtained prior to SNF admission: Dg Lumbar Spine Complete  Result Date: 08/29/2017 CLINICAL DATA:  Recent fall with low back pain, initial encounter EXAM: LUMBAR SPINE - COMPLETE 4+ VIEW COMPARISON:  04/18/2006 FINDINGS: Five lumbar type vertebral bodies are well visualized. Vertebral body height is well maintained. No pars defects are seen. No compression deformity is noted. No anterolisthesis is seen. No definitive soft tissue abnormality is noted. The known pubic rami fractures are not well appreciated on this exam. IMPRESSION: No acute abnormality in the lumbar spine. Electronically Signed   By: Alcide Clever M.D.   On: 08/29/2017 22:30   Dg Chest Port 1 View  Result Date: 08/30/2017 CLINICAL DATA:  Patient fell around 1300 hours today. Bilateral hip and back pain. EXAM: PORTABLE CHEST 1 VIEW COMPARISON:  05/13/2017 FINDINGS: Cardiomegaly with aortic atherosclerosis unchanged in appearance. Subsegmental atelectasis and/or scarring is seen along the periphery of the left lung base and to a lesser degree the right lower lobe. Clearing of bilateral pleural effusions since prior study. Chronic interstitial lung disease without of bili consolidation or CHF. 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 No acute displaced rib fracture. IMPRESSION: Stable cardiomegaly with aortic atherosclerosis. Chronic interstitial disease with subsegmental atelectasis and/or scarring within both lower lobes, left greater than right. Electronically Signed   By: Tollie Eth M.D.   On: 08/30/2017 01:50   Dg Hips Bilat With Pelvis 2v  Result Date: 08/29/2017 CLINICAL DATA:  Recent fall with hip pain, initial encounter EXAM: DG HIP (WITH OR WITHOUT PELVIS) 2V BILAT COMPARISON:  None. FINDINGS: The proximal femurs are within normal  limits. There is a fracture through the superior pubic ramus on the left with only minimal displacement. Mild irregularity of the inferior pubic ramus on the left is noted as well consistent with an undisplaced fracture. No other definitive fracture is seen. No soft tissue changes are noted. IMPRESSION: Fractures through the superior and inferior pubic rami on the left. Electronically Signed   By: Alcide Clever M.D.   On: 08/29/2017 22:29    Assessment/Plan   ICD-10-CM   1. Physical deconditioning R53.81   2. Closed fracture of ramus of left pubis, sequela S32.592S   3. 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 Communicated with pt and nursing.  Will follow  Labs/tests ordered: none    Arrabella Westerman S. Ancil Linsey  [facility] [address] [place], [place] [address] [phone] Cell (Monday-Friday 8 AM - 5 PM) [phone] After 5 PM and follow prompts

## 2017-09-11 ENCOUNTER — Encounter: Payer: Self-pay | Admitting: Adult Health

## 2017-09-11 NOTE — Progress Notes (Signed)
Entered in error

## 2017-09-15 ENCOUNTER — Emergency Department (HOSPITAL_COMMUNITY): Payer: Medicare Other

## 2017-09-15 ENCOUNTER — Observation Stay (HOSPITAL_COMMUNITY)
Admission: EM | Admit: 2017-09-15 | Discharge: 2017-09-16 | Disposition: A | Discharge status: Skilled Nursing Facility | Payer: Medicare Other | Attending: Internal Medicine | Admitting: Internal Medicine

## 2017-09-15 ENCOUNTER — Other Ambulatory Visit: Payer: Self-pay

## 2017-09-15 DIAGNOSIS — E11649 Type 2 diabetes mellitus with hypoglycemia without coma: Principal | ICD-10-CM | POA: Insufficient documentation

## 2017-09-15 DIAGNOSIS — E871 Hypo-osmolality and hyponatremia: Secondary | ICD-10-CM | POA: Diagnosis present

## 2017-09-15 DIAGNOSIS — K219 Gastro-esophageal reflux disease without esophagitis: Secondary | ICD-10-CM | POA: Diagnosis present

## 2017-09-15 DIAGNOSIS — E162 Hypoglycemia, unspecified: Secondary | ICD-10-CM | POA: Diagnosis present

## 2017-09-15 DIAGNOSIS — J90 Pleural effusion, not elsewhere classified: Secondary | ICD-10-CM | POA: Insufficient documentation

## 2017-09-15 DIAGNOSIS — B961 Klebsiella pneumoniae [K. pneumoniae] as the cause of diseases classified elsewhere: Secondary | ICD-10-CM | POA: Insufficient documentation

## 2017-09-15 DIAGNOSIS — N390 Urinary tract infection, site not specified: Secondary | ICD-10-CM

## 2017-09-15 DIAGNOSIS — Z992 Dependence on renal dialysis: Secondary | ICD-10-CM | POA: Diagnosis not present

## 2017-09-15 DIAGNOSIS — N186 End stage renal disease: Secondary | ICD-10-CM | POA: Diagnosis not present

## 2017-09-15 DIAGNOSIS — Z9115 Patient's noncompliance with renal dialysis: Secondary | ICD-10-CM | POA: Diagnosis not present

## 2017-09-15 DIAGNOSIS — R4189 Other symptoms and signs involving cognitive functions and awareness: Secondary | ICD-10-CM | POA: Diagnosis present

## 2017-09-15 DIAGNOSIS — E1122 Type 2 diabetes mellitus with diabetic chronic kidney disease: Secondary | ICD-10-CM | POA: Diagnosis not present

## 2017-09-15 DIAGNOSIS — IMO0002 Reserved for concepts with insufficient information to code with codable children: Secondary | ICD-10-CM | POA: Diagnosis present

## 2017-09-15 DIAGNOSIS — I700 Atherosclerosis of aorta: Secondary | ICD-10-CM | POA: Insufficient documentation

## 2017-09-15 DIAGNOSIS — I5032 Chronic diastolic (congestive) heart failure: Secondary | ICD-10-CM | POA: Diagnosis not present

## 2017-09-15 DIAGNOSIS — Z79899 Other long term (current) drug therapy: Secondary | ICD-10-CM | POA: Diagnosis not present

## 2017-09-15 DIAGNOSIS — E1165 Type 2 diabetes mellitus with hyperglycemia: Secondary | ICD-10-CM

## 2017-09-15 DIAGNOSIS — Z794 Long term (current) use of insulin: Secondary | ICD-10-CM | POA: Diagnosis not present

## 2017-09-15 DIAGNOSIS — N3001 Acute cystitis with hematuria: Secondary | ICD-10-CM | POA: Insufficient documentation

## 2017-09-15 DIAGNOSIS — Z8673 Personal history of transient ischemic attack (TIA), and cerebral infarction without residual deficits: Secondary | ICD-10-CM | POA: Diagnosis not present

## 2017-09-15 DIAGNOSIS — R1310 Dysphagia, unspecified: Secondary | ICD-10-CM | POA: Diagnosis not present

## 2017-09-15 DIAGNOSIS — R4182 Altered mental status, unspecified: Secondary | ICD-10-CM | POA: Diagnosis not present

## 2017-09-15 LAB — CBC WITH DIFFERENTIAL/PLATELET
Abs Immature Granulocytes: 0.1 10*3/uL (ref 0.0–0.1)
BASOS PCT: 1 %
Basophils Absolute: 0.1 10*3/uL (ref 0.0–0.1)
EOS ABS: 0.1 10*3/uL (ref 0.0–0.7)
EOS PCT: 1 %
HEMATOCRIT: 37.3 % (ref 36.0–46.0)
Hemoglobin: 12 g/dL (ref 12.0–15.0)
Immature Granulocytes: 1 %
LYMPHS ABS: 1 10*3/uL (ref 0.7–4.0)
Lymphocytes Relative: 10 %
MCH: 29.6 pg (ref 26.0–34.0)
MCHC: 32.2 g/dL (ref 30.0–36.0)
MCV: 92.1 fL (ref 78.0–100.0)
Monocytes Absolute: 0.3 10*3/uL (ref 0.1–1.0)
Monocytes Relative: 3 %
NEUTROS PCT: 84 %
Neutro Abs: 8.1 10*3/uL — ABNORMAL HIGH (ref 1.7–7.7)
PLATELETS: 400 10*3/uL (ref 150–400)
RBC: 4.05 MIL/uL (ref 3.87–5.11)
RDW: 14.4 % (ref 11.5–15.5)
WBC: 9.5 10*3/uL (ref 4.0–10.5)

## 2017-09-15 LAB — COMPREHENSIVE METABOLIC PANEL
ALBUMIN: 2.9 g/dL — AB (ref 3.5–5.0)
ALT: 14 U/L (ref 14–54)
ANION GAP: 12 (ref 5–15)
AST: 23 U/L (ref 15–41)
Alkaline Phosphatase: 133 U/L — ABNORMAL HIGH (ref 38–126)
BILIRUBIN TOTAL: 0.4 mg/dL (ref 0.3–1.2)
BUN: 62 mg/dL — ABNORMAL HIGH (ref 6–20)
CHLORIDE: 87 mmol/L — AB (ref 101–111)
CO2: 29 mmol/L (ref 22–32)
Calcium: 8.5 mg/dL — ABNORMAL LOW (ref 8.9–10.3)
Creatinine, Ser: 4.98 mg/dL — ABNORMAL HIGH (ref 0.44–1.00)
GFR calc Af Amer: 9 mL/min — ABNORMAL LOW (ref >60)
GFR, EST NON AFRICAN AMERICAN: 8 mL/min — AB (ref >60)
Glucose, Bld: 250 mg/dL — ABNORMAL HIGH (ref 65–99)
POTASSIUM: 5 mmol/L (ref 3.5–5.1)
Sodium: 128 mmol/L — ABNORMAL LOW (ref 135–145)
TOTAL PROTEIN: 6.9 g/dL (ref 6.5–8.1)

## 2017-09-15 LAB — CBG MONITORING, ED
GLUCOSE-CAPILLARY: 41 mg/dL — AB (ref 65–99)
GLUCOSE-CAPILLARY: 173 mg/dL — AB (ref 65–99)
GLUCOSE-CAPILLARY: 183 mg/dL — AB (ref 65–99)
GLUCOSE-CAPILLARY: 118 mg/dL — AB (ref 65–99)
GLUCOSE-CAPILLARY: 158 mg/dL — AB (ref 65–99)
GLUCOSE-CAPILLARY: 93 mg/dL (ref 65–99)
GLUCOSE-CAPILLARY: 179 mg/dL — AB (ref 65–99)
Glucose-Capillary: 177 mg/dL — ABNORMAL HIGH (ref 65–99)
Glucose-Capillary: 59 mg/dL — ABNORMAL LOW (ref 65–99)
Glucose-Capillary: 117 mg/dL — ABNORMAL HIGH (ref 65–99)
Glucose-Capillary: 71 mg/dL (ref 65–99)
Glucose-Capillary: 222 mg/dL — ABNORMAL HIGH (ref 65–99)
Glucose-Capillary: 189 mg/dL — ABNORMAL HIGH (ref 65–99)
Glucose-Capillary: 161 mg/dL — ABNORMAL HIGH (ref 65–99)

## 2017-09-15 LAB — I-STAT CG4 LACTIC ACID, ED
Lactic Acid, Venous: 1.97 mmol/L — ABNORMAL HIGH (ref 0.5–1.9)
Lactic Acid, Venous: 1.25 mmol/L (ref 0.5–1.9)

## 2017-09-15 LAB — RAPID URINE DRUG SCREEN, HOSP PERFORMED
Amphetamines: NOT DETECTED
BARBITURATES: NOT DETECTED
Benzodiazepines: NOT DETECTED
Cocaine: NOT DETECTED
Opiates: POSITIVE — AB
Tetrahydrocannabinol: NOT DETECTED

## 2017-09-15 LAB — URINALYSIS, ROUTINE W REFLEX MICROSCOPIC
BILIRUBIN URINE: NEGATIVE
Glucose, UA: >=500 mg/dL — AB
KETONES UR: NEGATIVE mg/dL
Nitrite: NEGATIVE
PH: 8 (ref 5.0–8.0)
Protein, ur: >=300 mg/dL — AB
Specific Gravity, Urine: 1.013 (ref 1.005–1.030)

## 2017-09-15 LAB — HEMOGLOBIN A1C
Hgb A1c MFr Bld: 9.2 % — ABNORMAL HIGH (ref 4.8–5.6)
Mean Plasma Glucose: 217.34 mg/dL

## 2017-09-15 MED ORDER — HEPARIN SODIUM (PORCINE) 5000 UNIT/ML IJ SOLN
5000.0000 [IU] | Freq: Three times a day (TID) | INTRAMUSCULAR | Status: DC
  Administered 2017-09-15 – 2017-09-16 (×2): 5000 [IU] via SUBCUTANEOUS
  Filled 2017-09-15 (×2): qty 1

## 2017-09-15 MED ORDER — POLYETHYLENE GLYCOL 3350 17 G PO PACK: 17.0000 g | PACK | Freq: Every day | ORAL | Status: DC | PRN

## 2017-09-15 MED ORDER — NEPRO/CARBSTEADY PO LIQD
237.0000 mL | Freq: Two times a day (BID) | ORAL | Status: DC
  Administered 2017-09-16: 237 mL via ORAL
  Filled 2017-09-15 (×3): qty 237

## 2017-09-15 MED ORDER — INSULIN ASPART 100 UNIT/ML ~~LOC~~ SOLN
0.0000 [IU] | Freq: Three times a day (TID) | SUBCUTANEOUS | Status: DC
  Administered 2017-09-15: 2 [IU] via SUBCUTANEOUS
  Filled 2017-09-15: qty 1

## 2017-09-15 MED ORDER — ZOLPIDEM TARTRATE 5 MG PO TABS: 5.0000 mg | ORAL_TABLET | Freq: Every evening | ORAL | Status: DC | PRN

## 2017-09-15 MED ORDER — HYDRALAZINE HCL 20 MG/ML IJ SOLN: 5.0000 mg | INTRAMUSCULAR | Status: DC | PRN

## 2017-09-15 MED ORDER — INSULIN ASPART 100 UNIT/ML ~~LOC~~ SOLN: 0.0000 [IU] | Freq: Every day | SUBCUTANEOUS | Status: DC

## 2017-09-15 MED ORDER — DOXERCALCIFEROL 4 MCG/2ML IV SOLN
INTRAVENOUS | Status: AC
  Filled 2017-09-15: qty 2

## 2017-09-15 MED ORDER — SODIUM CHLORIDE 0.9 % IV BOLUS
500.0000 mL | Freq: Once | INTRAVENOUS | Status: AC
  Administered 2017-09-15: 500 mL via INTRAVENOUS

## 2017-09-15 MED ORDER — DEXTROSE 10 % IV SOLN
INTRAVENOUS | Status: DC
  Administered 2017-09-15: 06:00:00 via INTRAVENOUS

## 2017-09-15 MED ORDER — METHOCARBAMOL 500 MG PO TABS: 500.0000 mg | ORAL_TABLET | Freq: Four times a day (QID) | ORAL | Status: DC | PRN

## 2017-09-15 MED ORDER — SODIUM CHLORIDE 0.9 % IV SOLN
1.0000 g | Freq: Once | INTRAVENOUS | Status: AC
  Administered 2017-09-16: 1 g via INTRAVENOUS
  Filled 2017-09-15: qty 10

## 2017-09-15 MED ORDER — RENA-VITE PO TABS
1.0000 | ORAL_TABLET | Freq: Every day | ORAL | Status: DC
  Administered 2017-09-16: 1 via ORAL
  Filled 2017-09-15 (×2): qty 1

## 2017-09-15 MED ORDER — ACETAMINOPHEN 325 MG PO TABS
650.0000 mg | ORAL_TABLET | Freq: Three times a day (TID) | ORAL | Status: DC | PRN
  Administered 2017-09-16: 650 mg via ORAL
  Filled 2017-09-15: qty 2

## 2017-09-15 MED ORDER — DEXTROSE 50 % IV SOLN
50.0000 mL | Freq: Once | INTRAVENOUS | Status: AC
  Administered 2017-09-15: 50 mL via INTRAVENOUS
  Filled 2017-09-15: qty 50

## 2017-09-15 MED ORDER — DEXTROSE 50 % IV SOLN
INTRAVENOUS | Status: AC
  Administered 2017-09-15: 50 mL
  Filled 2017-09-15: qty 100

## 2017-09-15 MED ORDER — CALCIUM ACETATE (PHOS BINDER) 667 MG PO CAPS
667.0000 mg | ORAL_CAPSULE | Freq: Three times a day (TID) | ORAL | Status: DC
  Filled 2017-09-15 (×7): qty 1

## 2017-09-15 MED ORDER — DEXTROSE 50 % IV SOLN: 50.0000 mL | INTRAVENOUS | Status: DC | PRN

## 2017-09-15 MED ORDER — ONDANSETRON HCL 4 MG PO TABS: 4.0000 mg | ORAL_TABLET | Freq: Four times a day (QID) | ORAL | Status: DC | PRN

## 2017-09-15 MED ORDER — PANTOPRAZOLE SODIUM 40 MG PO TBEC
40.0000 mg | DELAYED_RELEASE_TABLET | Freq: Every day | ORAL | Status: DC
  Administered 2017-09-15 – 2017-09-16 (×2): 40 mg via ORAL
  Filled 2017-09-15 (×2): qty 1

## 2017-09-15 MED ORDER — DOXERCALCIFEROL 4 MCG/2ML IV SOLN
3.0000 ug | INTRAVENOUS | Status: DC
  Administered 2017-09-15: 3 ug via INTRAVENOUS
  Filled 2017-09-15: qty 2

## 2017-09-15 MED ORDER — SODIUM CHLORIDE 0.9 % IV SOLN
1.0000 g | Freq: Once | INTRAVENOUS | Status: AC
  Administered 2017-09-15: 1 g via INTRAVENOUS
  Filled 2017-09-15: qty 10

## 2017-09-15 MED ORDER — ACETAMINOPHEN 325 MG PO TABS
ORAL_TABLET | ORAL | Status: AC
  Administered 2017-09-15: 650 mg
  Filled 2017-09-15: qty 2

## 2017-09-15 MED ORDER — ONDANSETRON HCL 4 MG/2ML IJ SOLN: 4.0000 mg | Freq: Four times a day (QID) | INTRAMUSCULAR | Status: DC | PRN

## 2017-09-15 MED ORDER — PRO-STAT SUGAR FREE PO LIQD
30.0000 mL | Freq: Two times a day (BID) | ORAL | Status: DC
  Administered 2017-09-16 (×3): 30 mL via ORAL
  Filled 2017-09-15 (×6): qty 30

## 2017-09-15 NOTE — ED Notes (Signed)
Messaged pharmacy to send patients missing meds 

## 2017-09-15 NOTE — ED Notes (Signed)
Attempted to call pt daughter at her request and left voicemail. Speaking with patient using translator

## 2017-09-15 NOTE — ED Notes (Signed)
Breakfast tray given and patient sat up in bed ready to eat

## 2017-09-15 NOTE — Progress Notes (Addendum)
Subjective:  Recently admitted 5/11- 17 /19  L Sup/ inferior pubic ramus fx's and NHP now admitted  From NH ([facility])with Unresponsiveness and hypoglycemia CBG 38 . CT hd and CXR  NAD.  She last had HD on schedule  Saturday  . IN ER  Alert just ate breakfast  No cos . For  HD today .  Objective Vital signs in last 24 hours: Vitals:   09/15/17 0600 09/15/17 0615 09/15/17 0630 09/15/17 1000  BP: (!) 148/72 (!) 146/65 (!) 148/67 (!) 149/62  Pulse: (!) 107 (!) 105 (!) 105 (!) 103  Resp: SpO2: 97% 99% 94% 98%   Weight change:   Physical Exam: General: Thin, pleasant female in NAD Heart: S1,S2, RRR Lungs: CTAB. No WOB.  Abdomen: Active BS ,soft , NT, ND Extremities: No LE edema.  Dialysis Access: LUA AVF pos bruit .    HD orders: [facility] TTS 4h  43.5kg  2k /2.25ca bath  Hep 1400  LUA AVF -Hectorol 3 mcg IV TIW   Problem/Plan:  1. ESRD - HD today  K 5.0 use 2k bath , volume ok  Na 129  With DM rx in progress  2. Uncontrolled DM type 2 with hypoglycemia - per admit team  3. Anemia - hgb 12 no esa  Needs  4. Secondary hyperparathyroidism - on hec on hd / phoslo binder  5. HTN/volume  No excess vol on exam pro cxr / no bp meds  6. HO CVA 7. HO  08/29/17  L Sup/ inferior pubic ramus fx's   Lenny Pastel, PA-C [facility] Beeper [phone] 09/15/2017,10:39 AM  LOS: 0 days   Pt seen, examined and agree w A/P as above.  Vinson Moselle MD [employer] pager [phone]   09/15/2017, 12:07 PM    Labs: Basic Metabolic Panel: Recent Labs  Lab 09/15/17 0215  NA 128*  K 5.0  CL 87*  CO2 29  GLUCOSE 250*  BUN 62*  CREATININE 4.98*  CALCIUM 8.5*   Liver Function Tests: Recent Labs  Lab 09/15/17 0215  AST 23  ALT 14  ALKPHOS 133*  BILITOT 0.4  PROT 6.9  ALBUMIN 2.9*   No results for input(s): LIPASE, AMYLASE in the last 168 hours. No results for input(s): AMMONIA in the last 168 hours. CBC: Recent Labs  Lab  09/15/17 0215  WBC 9.5  NEUTROABS 8.1*  HGB 12.0  HCT 37.3  MCV 92.1  PLT 400   Cardiac Enzymes: No results for input(s): CKTOTAL, CKMB, CKMBINDEX, TROPONINI in the last 168 hours. CBG: Recent Labs  Lab 09/15/17 0520 09/15/17 0559 09/15/17 0832 09/15/17 0908 09/15/17 1033  GLUCAP 41* 173* 183* 222* 189*    Studies/Results: Dg Chest 2 View  Result Date: 09/15/2017 CLINICAL DATA:  Acute onset of altered mental status. Found unresponsive. EXAM: CHEST - 2 VIEW COMPARISON:  Chest radiograph performed 08/30/2017 FINDINGS: The lungs are well-aerated. Small bilateral pleural effusions are noted. Left basilar airspace opacity is similar in appearance and may reflect atelectasis or scarring. Peribronchial thickening is noted. Chronically increased interstitial markings are seen. There is no evidence of pneumothorax. The heart is enlarged.  No acute osseous abnormalities are seen. IMPRESSION: Small bilateral pleural effusions noted. Left basilar airspace opacity is similar in appearance and may reflect atelectasis or scarring. Chronically increased interstitial markings noted. Electronically Signed   By: Roanna Raider M.D.   On: 09/15/2017 02:57   Ct Head Wo Contrast  Result Date: 09/15/2017 CLINICAL  DATA:  Altered mental status. Found unresponsive at nursing facility. EXAM: CT HEAD WITHOUT CONTRAST TECHNIQUE: Contiguous axial images were obtained from the base of the skull through the vertex without intravenous contrast. COMPARISON:  None. FINDINGS: Brain: There is no mass, hemorrhage or extra-axial collection. The size and configuration of the ventricles and extra-axial CSF spaces are normal. There is no acute or chronic infarction. The brain parenchyma is normal. Vascular: No abnormal hyperdensity of the major intracranial arteries or dural venous sinuses. No intracranial atherosclerosis. Skull: The visualized skull base, calvarium and extracranial soft tissues are normal. Sinuses/Orbits: No  fluid levels or advanced mucosal thickening of the visualized paranasal sinuses. No mastoid or middle ear effusion. The orbits are normal. IMPRESSION: Normal aging brain without acute intracranial abnormality. Electronically Signed   By: Deatra Robinson M.D.   On: 09/15/2017 03:15   Medications: . [START ON 09/16/2017] cefTRIAXone (ROCEPHIN)  IV    . dextrose 50 mL/hr at 09/15/17 0530   . calcium acetate  667 mg Oral TID WC  . feeding supplement (NEPRO CARB STEADY)  237 mL Oral BID BM  . feeding supplement (PRO-STAT SUGAR FREE 64)  30 mL Oral BID  . heparin  5,000 Units Subcutaneous Q8H  . multivitamin  1 tablet Oral QHS  . pantoprazole  40 mg Oral Daily

## 2017-09-15 NOTE — ED Triage Notes (Signed)
Pt from [facility]. Last seen normal at 2100. Checked on at 0100 and found unresponsive. CBG 38. Given oral glucose and glucagon.  Increased to 54. EMS gave 12.5 of D10 with CBG of 125 at 0150.  CBG on arrival to room 59.  Pt given 1 amp of D50 per verbal from EDP

## 2017-09-15 NOTE — Progress Notes (Signed)
Frances Reeves, is a 67 y.o. female, DOB - 05/23/1950, MRN:[id number]  67 year old Hispanic female who is a nursing home resident admitted few hours ago for hypoglycemia likely caused by her being on combination of Amaryl along with insulin, she is also a dialysis patient, hypoglycemia has resolved after initial treatment in the ER, will discontinue Amaryl, continue sliding scale with oral diet and monitor, renal has been consulted as she is due for dialysis today.  If stable after dialysis and sugars remain fine can be discharged tomorrow back to SNF without Amaryl.  Her CT head was nonacute and chest x-ray was stable as well.  She was evaluated with the help of online interpreter, she had no subjective complaints today.   Vitals:   09/15/17 0630 09/15/17 1000 09/15/17 1030 09/15/17 1100  BP: (!) 148/67 (!) 149/62 (!) 148/65 (!) 142/59  Pulse: (!) 105 (!) 103 (!) 104 (!) 102  Resp: SpO2: 94% 98% 96% 95%        Data Review   Micro Results No results found for this or any previous visit (from the past 240 hour(s)).  Radiology Reports Dg Chest 2 View  Result Date: 09/15/2017 CLINICAL DATA:  Acute onset of altered mental status. Found unresponsive. EXAM: CHEST - 2 VIEW COMPARISON:  Chest radiograph performed 08/30/2017 FINDINGS: The lungs are well-aerated. Small bilateral pleural effusions are noted. Left basilar airspace opacity is similar in appearance and may reflect atelectasis or scarring. Peribronchial thickening is noted. Chronically increased interstitial markings are seen. There is no evidence of pneumothorax. The heart is enlarged.  No acute osseous abnormalities are seen. IMPRESSION: Small bilateral pleural effusions noted. Left basilar airspace opacity is similar in appearance and may reflect atelectasis or scarring. Chronically increased interstitial markings noted. Electronically  Signed   By: Roanna Raider M.D.   On: 09/15/2017 02:57   Dg Lumbar Spine Complete  Result Date: 08/29/2017 CLINICAL DATA:  Recent fall with low back pain, initial encounter EXAM: LUMBAR SPINE - COMPLETE 4+ VIEW COMPARISON:  04/18/2006 FINDINGS: Five lumbar type vertebral bodies are well visualized. Vertebral body height is well maintained. No pars defects are seen. No compression deformity is noted. No anterolisthesis is seen. No definitive soft tissue abnormality is noted. The known pubic rami fractures are not well appreciated on this exam. IMPRESSION: No acute abnormality in the lumbar spine. Electronically Signed   By: Alcide Clever M.D.   On: 08/29/2017 22:30   Ct Head Wo Contrast  Result Date: 09/15/2017 CLINICAL DATA:  Altered mental status. Found unresponsive at nursing facility. EXAM: CT HEAD WITHOUT CONTRAST TECHNIQUE: Contiguous axial images were obtained from the base of the skull through the vertex without intravenous contrast. COMPARISON:  None. FINDINGS: Brain: There is no mass, hemorrhage or extra-axial collection. The size and configuration of the ventricles and extra-axial CSF spaces are normal. There is no acute or chronic infarction. The brain parenchyma is normal. Vascular: No abnormal hyperdensity of the major intracranial arteries or dural venous sinuses. No intracranial atherosclerosis. Skull: The visualized skull base, calvarium and extracranial soft tissues are normal. Sinuses/Orbits: No fluid levels or advanced mucosal thickening of the visualized paranasal sinuses. No mastoid or middle ear effusion. The orbits are normal. IMPRESSION: Normal aging brain without acute intracranial abnormality. Electronically Signed   By: Deatra Robinson M.D.   On: 09/15/2017 03:15   Dg Esophagus  Result Date: 08/31/2017 CLINICAL DATA:  Dysphagia, difficulty swallowing food and liquids. EXAM: ESOPHOGRAM/BARIUM SWALLOW TECHNIQUE: Single  contrast examination was performed using  thin barium.  FLUOROSCOPY TIME:  Fluoroscopy Time:  1 minutes, 24 seconds Radiation Exposure Index (if provided by the fluoroscopic device): 8.0 mGy Number of Acquired Spot Images: 0 COMPARISON:  None. FINDINGS: The patient is relatively frail and accordingly the standard esophagram with standing, turning, carefully timed double contrast swallowing, and extensive maneuvering on the table was not feasible. Instead, single swallows were performed with patient in the LPO position. There were occasional tertiary contractions in the esophagus as on image 10/3. Primary peristaltic waves in the esophagus were preserved on 3/4 swallows. As shown for example on image 19/1 and image 22/2, there is some slight narrowing of the thoracic esophagus distally which is smoothly marginated. This appears somewhat pulsatile, suggesting that the narrowing is probably due to esophageal compression between the enlarged heart and the spine. Mild smooth fold thickening in this segment of the esophagus is suggested on image 32/2. The patient swallowed a 13 mm barium pill which passed into the stomach without delay. Note is also made of cardiomegaly and atherosclerotic calcification of the aortic arch. IMPRESSION: 1. Smooth, likely extrinsic narrowing of the esophagus adjacent to the enlarged heart. This seems pulsatile and I suspect that the appearance is primarily due to the esophageal positioning between the enlarged heart and spine. Mild fold thickening of the esophagus in this vicinity suggests mild-to-moderate esophagitis. 2. Scattered tertiary contractions in the esophagus but primary peristaltic waves were preserved on 3/4 swallows. 3. Please note that due to patient frailty the typical protocol for esophagram could not be performed, and today's images were obtained with the patient laying down in the LPO position. This reduces the exams sensitivity and specificity compared to the normal comprehensive esophagram performed on patients able to stand,  turn, and follow complex swallowing instructions. 4. Atherosclerotic calcification of the aortic arch. Electronically Signed   By: Gaylyn Rong M.D.   On: 08/31/2017 08:42   Dg Chest Port 1 View  Result Date: 08/30/2017 CLINICAL DATA:  Patient fell around 1300 hours today. Bilateral hip and back pain. EXAM: PORTABLE CHEST 1 VIEW COMPARISON:  05/13/2017 FINDINGS: Cardiomegaly with aortic atherosclerosis unchanged in appearance. Subsegmental atelectasis and/or scarring is seen along the periphery of the left lung base and to a lesser degree the right lower lobe. Clearing of bilateral pleural effusions since prior study. Chronic interstitial lung disease without of bili consolidation or CHF. 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 No acute displaced rib fracture. IMPRESSION: Stable cardiomegaly with aortic atherosclerosis. Chronic interstitial disease with subsegmental atelectasis and/or scarring within both lower lobes, left greater than right. Electronically Signed   By: Tollie Eth M.D.   On: 08/30/2017 01:50   Dg Hips Bilat With Pelvis 2v  Result Date: 08/29/2017 CLINICAL DATA:  Recent fall with hip pain, initial encounter EXAM: DG HIP (WITH OR WITHOUT PELVIS) 2V BILAT COMPARISON:  None. FINDINGS: The proximal femurs are within normal limits. There is a fracture through the superior pubic ramus on the left with only minimal displacement. Mild irregularity of the inferior pubic ramus on the left is noted as well consistent with an undisplaced fracture. No other definitive fracture is seen. No soft tissue changes are noted. IMPRESSION: Fractures through the superior and inferior pubic rami on the left. Electronically Signed   By: Alcide Clever M.D.   On: 08/29/2017 22:29    CBC Recent Labs  Lab 09/15/17 0215  WBC 9.5  HGB 12.0  HCT 37.3  PLT 400  MCV 92.1  MCH 29.6  MCHC 32.2  RDW 14.4  LYMPHSABS 1.0  MONOABS 0.3  EOSABS 0.1  BASOSABS 0.1    Chemistries    Recent Labs  Lab 09/15/17 0215  NA 128*  K 5.0  CL 87*  CO2 29  GLUCOSE 250*  BUN 62*  CREATININE 4.98*  CALCIUM 8.5*  AST 23  ALT 14  ALKPHOS 133*  BILITOT 0.4   ------------------------------------------------------------------------------------------------------------------ estimated creatinine clearance is 6.9 mL/min (A) (by C-G formula based on SCr of 4.98 mg/dL (H)). ------------------------------------------------------------------------------------------------------------------ No results for input(s): HGBA1C in the last 72 hours. ------------------------------------------------------------------------------------------------------------------ No results for input(s): CHOL, HDL, LDLCALC, TRIG, CHOLHDL, LDLDIRECT in the last 72 hours. ------------------------------------------------------------------------------------------------------------------ No results for input(s): TSH, T4TOTAL, T3FREE, THYROIDAB in the last 72 hours.  Invalid input(s): FREET3 ------------------------------------------------------------------------------------------------------------------ No results for input(s): VITAMINB12, FOLATE, FERRITIN, TIBC, IRON, RETICCTPCT in the last 72 hours.  Coagulation profile No results for input(s): INR, PROTIME in the last 168 hours.  No results for input(s): DDIMER in the last 72 hours.  Cardiac Enzymes No results for input(s): CKMB, TROPONINI, MYOGLOBIN in the last 168 hours.  Invalid input(s): CK ------------------------------------------------------------------------------------------------------------------ Invalid input(s): POCBNP

## 2017-09-15 NOTE — ED Provider Notes (Signed)
[facility] EMERGENCY DEPARTMENT Provider Note   CSN: [id number] Arrival date & time: 09/15/17  4098     History   Chief Complaint Chief Complaint  Patient presents with  . Hypoglycemia    HPI Frances Reeves is a 67 y.o. female.  Level 5 caveat for language barrier and altered mental status.  Patient from nursing home with hypoglycemia and decreased mental status.  She was last seen normal at 9 PM.  She was checked on at 1 AM and found to be minimally responsive.  CBG was 38.  She is given oral glucose and glucagon.  Glucose increased only to 54.  EMS gave D10 and CBG increased to 125.  Patient does have a history of diabetes and takes Lantus as well as sliding scale. She was recently hospitalized for pelvic fractures were nonoperative.  She denies any fevers but has been having chills.  She does have a indwelling Foley catheter in place.  Had 2 episodes of vomiting yesterday but none today.  No abdominal pain, chest pain, shortness of breath.  Denies any missed dialysis sessions No recent illnesses or sick contacts.  The history is provided by the patient and the EMS personnel. The history is limited by a language barrier and the condition of the patient.  Hypoglycemia    Past Medical History:  Diagnosis Date  . Anemia   . Chronic kidney disease   . Diabetes mellitus without complication (HCC)   . GERD (gastroesophageal reflux disease)   . Stroke [facility])    in her 31's    Patient Active Problem List   Diagnosis Date Noted  . 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 Constipation due to opioid therapy 09/07/2017  . Malnutrition of moderate degree 09/01/2017  . Pubic ramus fracture, left, closed, initial encounter (HCC) 08/30/2017  . Chronic diastolic CHF (congestive heart failure) (HCC) 08/30/2017  . Pubic ramus fracture (HCC) 08/30/2017  . Noncompliance of patient with renal dialysis (HCC) 05/13/2017    . Uncontrolled type 2 diabetes mellitus with ESRD (end-stage renal disease) (HCC) 02/07/2017  . ESRD (end stage renal disease) (HCC) 02/07/2017  . GERD without esophagitis 02/07/2017  . Anemia 02/07/2017  . History of CVA (cerebrovascular accident) 02/07/2017  . Chest pain 02/07/2017  . Shortness of breath 02/07/2017    Past Surgical History:  Procedure Laterality Date  . APPENDECTOMY    . BASCILIC VEIN TRANSPOSITION Left 11/24/2016   Procedure: FIRST STAGE BASCILIC VEIN TRANSPOSITION;  Surgeon: Maeola Harman, MD;  Location: [facility] OR;  Service: Vascular;  Laterality: Left;  . EYE SURGERY Bilateral    cataract surgery with lens implant  . INSERTION OF DIALYSIS CATHETER Right 11/24/2016   Procedure: INSERTION OF TUNNELED DIALYSIS CATHETER;  Surgeon: Maeola Harman, MD;  Location: [facility] OR;  Service: Vascular;  Laterality: Right;     OB History   None      Home Medications    Prior to Admission medications   Medication Sig Start Date End Date Taking? Authorizing Provider  acetaminophen (TYLENOL) 650 MG CR tablet Take 650 mg by mouth every 8 (eight) hours as needed for pain.    [provider]  Amino Acids-Protein Hydrolys (FEEDING SUPPLEMENT, PRO-STAT SUGAR FREE 64,) LIQD Take 30 mLs by mouth 2 (two) times daily. 09/04/17   Osvaldo Shipper, MD  calcium acetate (PHOSLO) 667 MG capsule Take 667 mg by mouth 3 (three) times daily with meals. 02/20/17  [provider]  glimepiride (AMARYL) 4 MG tablet Take 4 mg by mouth daily. 11/03/16   [provider]  insulin aspart (NOVOLOG FLEXPEN) 100 UNIT/ML FlexPen Inject 5 Units into the skin 3 (three) times daily after meals. 09/07/17   [provider]  Insulin Glargine (LANTUS SOLOSTAR) 100 UNIT/ML Solostar Pen Inject 10 Units into the skin every evening. 09/07/17   [provider]  methocarbamol (ROBAXIN) 500 MG tablet Take 1 tablet (500 mg total) by mouth every 6 (six) hours as needed  for muscle spasms. 09/04/17   Osvaldo Shipper, MD  multivitamin (RENA-VIT) TABS tablet Take 1 tablet by mouth at bedtime. 09/04/17   Osvaldo Shipper, MD  Nutritional Supplements (FEEDING SUPPLEMENT, NEPRO CARB STEADY,) LIQD Take 237 mLs by mouth 2 (two) times daily between meals. 09/04/17   Osvaldo Shipper, MD  pantoprazole (PROTONIX) 40 MG tablet Take 1 tablet (40 mg total) by mouth daily. 09/04/17   Osvaldo Shipper, MD  polyethylene glycol [facility] Ethelene Hal) packet Take 17 g by mouth daily as needed for mild constipation. 09/04/17   Osvaldo Shipper, MD    Family History Family History  Problem Relation Age of Onset  . Diabetes Other   . CAD Neg Hx   . Cancer Neg Hx   . Renal Disease Neg Hx     Social History Social History   Tobacco Use  . Smoking status: Never Smoker  . Smokeless tobacco: Never Used  Substance Use Topics  . Alcohol use: No  . Drug use: No     Allergies   Patient has no known allergies.   Review of Systems Review of Systems  Unable to perform ROS: Mental status change     Physical Exam Updated Vital Signs BP (!) 158/85   Pulse (!) 101   Resp 16   SpO2 97%   Physical Exam  Constitutional: She is oriented to person, place, and time. She appears well-developed and well-nourished. No distress.  Chronically ill-appearing, awake and alert  HENT:  Head: Normocephalic and atraumatic.  Mouth/Throat: Oropharynx is clear and moist. No oropharyngeal exudate.  Eyes: Pupils are equal, round, and reactive to light. Conjunctivae and EOM are normal.  Neck: Normal range of motion. Neck supple.  No meningismus.  Cardiovascular: Normal rate, regular rhythm, normal heart sounds and intact distal pulses.  No murmur heard. Pulmonary/Chest: Effort normal and breath sounds normal. No respiratory distress.  Abdominal: Soft. There is no tenderness. There is no rebound and no guarding.  Genitourinary:  Genitourinary Comments: Foley catheter in place  Musculoskeletal:  Normal range of motion. She exhibits no edema or tenderness.  Dialysis fistula left upper extremity with thrill  Neurological: She is alert and oriented to person, place, and time. No cranial nerve deficit. She exhibits normal muscle tone. Coordination normal.  No ataxia on finger to nose bilaterally. No pronator drift. 5/5 strength throughout. CN 2-12 intact.Equal grip strength. Sensation intact.   Skin: Skin is warm.  Psychiatric: She has a normal mood and affect. Her behavior is normal.  Nursing note and vitals reviewed.    ED Treatments / Results  Labs (all labs ordered are listed, but only abnormal results are displayed) Labs Reviewed  CBC WITH DIFFERENTIAL/PLATELET - Abnormal; Notable for the following components:      Result Value   Neutro Abs 8.1 (*)    All other components within normal limits  COMPREHENSIVE METABOLIC PANEL - Abnormal; Notable for the following components:   Sodium 128 (*)  Chloride 87 (*)    Glucose, Bld 250 (*)    BUN 62 (*)    Creatinine, Ser 4.98 (*)    Calcium 8.5 (*)    Albumin 2.9 (*)    Alkaline Phosphatase 133 (*)    GFR calc non Af Amer 8 (*)    GFR calc Af Amer 9 (*)    All other components within normal limits  URINALYSIS, ROUTINE W REFLEX MICROSCOPIC - Abnormal; Notable for the following components:   APPearance HAZY (*)    Glucose, UA >=500 (*)    Hgb urine dipstick SMALL (*)    Protein, ur >=300 (*)    Leukocytes, UA SMALL (*)    WBC, UA >50 (*)    Bacteria, UA FEW (*)    All other components within normal limits  I-STAT CG4 LACTIC ACID, ED - Abnormal; Notable for the following components:   Lactic Acid, Venous 1.97 (*)    All other components within normal limits  CBG MONITORING, ED - Abnormal; Notable for the following components:   Glucose-Capillary 177 (*)    All other components within normal limits  CBG MONITORING, ED - Abnormal; Notable for the following components:   Glucose-Capillary 117 (*)    All other components  within normal limits  CBG MONITORING, ED - Abnormal; Notable for the following components:   Glucose-Capillary 41 (*)    All other components within normal limits  CBG MONITORING, ED - Abnormal; Notable for the following components:   Glucose-Capillary 173 (*)    All other components within normal limits  URINE CULTURE  CULTURE, BLOOD (ROUTINE X 2)  CULTURE, BLOOD (ROUTINE X 2)  RAPID URINE DRUG SCREEN, HOSP PERFORMED  HIV ANTIBODY (ROUTINE TESTING)  CBG MONITORING, ED  I-STAT CG4 LACTIC ACID, ED  CBG MONITORING, ED  CBG MONITORING, ED  CBG MONITORING, ED  CBG MONITORING, ED    EKG EKG Interpretation  Date/Time:  Tuesday Sep 15 2017 02:21:07 EDT Ventricular Rate:  101 PR Interval:    QRS Duration: 94 QT Interval:  380 QTC Calculation: 493 R Axis:   65 Text Interpretation:  Sinus tachycardia Probable left atrial enlargement Borderline prolonged QT interval No significant change was found Confirmed by Glynn Octave [phone]) on 09/15/2017 2:25:47 AM   Radiology Dg Chest 2 View  Result Date: 09/15/2017 CLINICAL DATA:  Acute onset of altered mental status. Found unresponsive. EXAM: CHEST - 2 VIEW COMPARISON:  Chest radiograph performed 08/30/2017 FINDINGS: The lungs are well-aerated. Small bilateral pleural effusions are noted. Left basilar airspace opacity is similar in appearance and may reflect atelectasis or scarring. Peribronchial thickening is noted. Chronically increased interstitial markings are seen. There is no evidence of pneumothorax. The heart is enlarged.  No acute osseous abnormalities are seen. IMPRESSION: Small bilateral pleural effusions noted. Left basilar airspace opacity is similar in appearance and may reflect atelectasis or scarring. Chronically increased interstitial markings noted. Electronically Signed   By: Roanna Raider M.D.   On: 09/15/2017 02:57   Ct Head Wo Contrast  Result Date: 09/15/2017 CLINICAL DATA:  Altered mental status. Found unresponsive  at nursing facility. EXAM: CT HEAD WITHOUT CONTRAST TECHNIQUE: Contiguous axial images were obtained from the base of the skull through the vertex without intravenous contrast. COMPARISON:  None. FINDINGS: Brain: There is no mass, hemorrhage or extra-axial collection. The size and configuration of the ventricles and extra-axial CSF spaces are normal. There is no acute or chronic infarction. The brain parenchyma is normal. Vascular: No abnormal hyperdensity  of the major intracranial arteries or dural venous sinuses. No intracranial atherosclerosis. Skull: The visualized skull base, calvarium and extracranial soft tissues are normal. Sinuses/Orbits: No fluid levels or advanced mucosal thickening of the visualized paranasal sinuses. No mastoid or middle ear effusion. The orbits are normal. IMPRESSION: Normal aging brain without acute intracranial abnormality. Electronically Signed   By: Deatra Robinson M.D.   On: 09/15/2017 03:15    Procedures Procedures (including critical care time)  Medications Ordered in ED Medications  dextrose 50 % solution (50 mLs  Given 09/15/17 0207)     Initial Impression / Assessment and Plan / ED Course  I have reviewed the triage vital signs and the nursing notes.  Pertinent labs & imaging results that were available during my care of the patient were reviewed by me and considered in my medical decision making (see chart for details).    Patient from [facility] with hypoglycemia. Did respond to glucagon and glucose.  Will check labs, r/o infection.  Patient denies any complaints.  She is gently hydrated.  Blood sugar has declined again and required repeat boluses of dextrose and patient started on D10 drip.  Urinalysis concerning for infection will change Foley and start antibiotics. Blood sugar has trended down again to 41 despite multiple doses of dextrose and p.o. Food.  We will plan admission for persistent hypoglycemia and possible UTI.  Discussed with Dr.  Clyde Lundborg.  CRITICAL CARE Performed by: Glynn Octave Total critical care time: 33 minutes Critical care time was exclusive of separately billable procedures and treating other patients. Critical care was necessary to treat or prevent imminent or life-threatening deterioration. Critical care was time spent personally by me on the following activities: development of treatment plan with patient and/or surrogate as well as nursing, discussions with consultants, evaluation of patient's response to treatment, examination of patient, obtaining history from patient or surrogate, ordering and performing treatments and interventions, ordering and review of laboratory studies, ordering and review of radiographic studies, pulse oximetry and re-evaluation of patient's condition.   Final Clinical Impressions(s) / ED Diagnoses   Final diagnoses:  Hypoglycemia  Lower urinary tract infectious disease    ED Discharge Orders    None       Alix Lahmann, Jeannett Senior, MD 09/15/17 [phone]

## 2017-09-15 NOTE — Discharge Planning (Signed)
Pt from [facility], will continue therapy if discharged back to SNF.

## 2017-09-15 NOTE — H&P (Signed)
History and Physical    Frances Reeves ZOX:[id number] DOB: 05/18/1950 DOA: 09/15/2017  Referring MD/NP/PA:   PCP: Patient, No Pcp Per   Patient coming from:  The patient is coming from [place].  At baseline, pt is partially dependent for most of ADL.   Chief Complaint: Unresponsiveness and hypoglycemia  HPI: Frances Reeves is a 67 y.o. female with medical history significant of diabetes mellitus, stroke, GERD, ESRD-HD (TTS), dCHF, who presents with unresponsiveness and hypoglycemia.  Pt speaks in Spanish, history is obtained via Science writer. Pt was recently hospitalized from 5/11-5/17 due to left pubic ramus fracture.  Patient had Foley catheter placed. Per report, pt was LKN at 21:00 and found to be unresponsive at 01:00. Her CBG was 38. Pt was treated with oral glucose, glucagon and D10. CBG on arrival to ED is 59. Pt was given D50 in ED and started with D10 at 50 cc/h. When I saw pt in ED, she is drowsy, but oriented x 3.  She reports back pain.  Patient denies chest pain, shortness breath, cough.  No nausea, vomiting, diarrhea or abdominal pain.  Does not have symptoms of UTI (patient had Foley catheter).  Patient does not have unilateral weakness, numbness or tingling his extremities.  No facial droop or slurred speech.  Patient states that she did not miss any session of dialysis.  She is due for dialysis today.  ED Course: pt was found to have WBC 9.5, lactic acid of 1.25, sodium 128, potassium 5.0, bicarbonate 29, creatinine 4.98, BUN 62, urinalysis positive for small amount of leukocyte, >50 WBC and few bacteria.  Chest x-ray showed a small amount of her bilateral pleural effusion.  CT head is negative for acute intracranial abnormalities.  Patient is placed on stepdown bed for observation.  Review of Systems:   General: no fevers, chills, no body weight gain, has fatigue HEENT: no blurry vision, hearing changes or sore throat Respiratory: no dyspnea, coughing,  wheezing CV: no chest pain, no palpitations GI: no nausea, vomiting, abdominal pain, diarrhea, constipation GU: no dysuria, burning on urination, increased urinary frequency, hematuria  Ext: no leg edema Neuro: no unilateral weakness, numbness, or tingling, no vision change or hearing loss.  Had unresponsiveness. Skin: no rash, no skin tear. MSK: has back pain. Heme: No easy bruising.  Travel history: No recent long distant travel.  Allergy: No Known Allergies  Past Medical History:  Diagnosis Date  . Anemia   . Chronic kidney disease   . Diabetes mellitus without complication (HCC)   . GERD (gastroesophageal reflux disease)   . Stroke [facility])    in her 89's    Past Surgical History:  Procedure Laterality Date  . APPENDECTOMY    . BASCILIC VEIN TRANSPOSITION Left 11/24/2016   Procedure: FIRST STAGE BASCILIC VEIN TRANSPOSITION;  Surgeon: Maeola Harman, MD;  Location: [facility] OR;  Service: Vascular;  Laterality: Left;  . EYE SURGERY Bilateral    cataract surgery with lens implant  . INSERTION OF DIALYSIS CATHETER Right 11/24/2016   Procedure: INSERTION OF TUNNELED DIALYSIS CATHETER;  Surgeon: Maeola Harman, MD;  Location: [facility] OR;  Service: Vascular;  Laterality: Right;    Social History:  reports that she has never smoked. She has never used smokeless tobacco. She reports that she does not drink alcohol or use drugs.  Family History:  Family History  Problem Relation Age of Onset  . Diabetes Other   . CAD Neg Hx   . Cancer Neg  Hx   . Renal Disease Neg Hx      Prior to Admission medications   Medication Sig Start Date End Date Taking? Authorizing Provider  acetaminophen (TYLENOL) 650 MG CR tablet Take 650 mg by mouth every 8 (eight) hours as needed for pain.    [provider]  Amino Acids-Protein Hydrolys (FEEDING SUPPLEMENT, PRO-STAT SUGAR FREE 64,) LIQD Take 30 mLs by mouth 2 (two) times daily. 09/04/17   Osvaldo Shipper, MD  calcium acetate  (PHOSLO) 667 MG capsule Take 667 mg by mouth 3 (three) times daily with meals. 02/20/17   [provider]  glimepiride (AMARYL) 4 MG tablet Take 4 mg by mouth daily. 11/03/16   [provider]  insulin aspart (NOVOLOG FLEXPEN) 100 UNIT/ML FlexPen Inject 5 Units into the skin 3 (three) times daily after meals. 09/07/17   [provider]  Insulin Glargine (LANTUS SOLOSTAR) 100 UNIT/ML Solostar Pen Inject 10 Units into the skin every evening. 09/07/17   [provider]  methocarbamol (ROBAXIN) 500 MG tablet Take 1 tablet (500 mg total) by mouth every 6 (six) hours as needed for muscle spasms. 09/04/17   Osvaldo Shipper, MD  multivitamin (RENA-VIT) TABS tablet Take 1 tablet by mouth at bedtime. 09/04/17   Osvaldo Shipper, MD  Nutritional Supplements (FEEDING SUPPLEMENT, NEPRO CARB STEADY,) LIQD Take 237 mLs by mouth 2 (two) times daily between meals. 09/04/17   Osvaldo Shipper, MD  pantoprazole (PROTONIX) 40 MG tablet Take 1 tablet (40 mg total) by mouth daily. 09/04/17   Osvaldo Shipper, MD  polyethylene glycol [facility]) packet Take 17 g by mouth daily as needed for mild constipation. 09/04/17   Osvaldo Shipper, MD    Physical Exam: Vitals:   09/15/17 0215 09/15/17 0315 09/15/17 0433  BP: (!) 158/85 (!) 131/94 (!) 157/79  Pulse: (!) 101 (!) 103 (!) 104  Resp: SpO2: 97% 99% 98%   General: Not in acute distress HEENT:       Eyes: PERRL, EOMI, no scleral icterus.       ENT: No discharge from the ears and nose, no pharynx injection, no tonsillar enlargement.        Neck: No JVD, no bruit, no mass felt. Heme: No neck lymph node enlargement. Cardiac: S1/S2, RRR, No murmurs, No gallops or rubs. Respiratory: No rales, wheezing, rhonchi or rubs. GI: Soft, nondistended, nontender, no rebound pain, no organomegaly, BS present. GU: No hematuria Ext: No pitting leg edema bilaterally. 2+DP/PT pulse bilaterally. Has AV fistula on left arm with good  bruit. Musculoskeletal: No joint deformities, No joint redness or warmth, no limitation of ROM in spin. Skin: No rashes.  Neuro: drowsy, but oriented X3, cranial nerves II-XII grossly intact, moves all extremities normally.  Psych: Patient is not psychotic, no suicidal or hemocidal ideation.  Labs on Admission: I have personally reviewed following labs and imaging studies  CBC: Recent Labs  Lab 09/15/17 0215  WBC 9.5  NEUTROABS 8.1*  HGB 12.0  HCT 37.3  MCV 92.1  PLT 400   Basic Metabolic Panel: Recent Labs  Lab 09/15/17 0215  NA 128*  K 5.0  CL 87*  CO2 29  GLUCOSE 250*  BUN 62*  CREATININE 4.98*  CALCIUM 8.5*   GFR: Estimated Creatinine Clearance: 6.9 mL/min (A) (by C-G formula based on SCr of 4.98 mg/dL (H)). Liver Function Tests: Recent Labs  Lab 09/15/17 0215  AST 23  ALT 14  ALKPHOS 133*  BILITOT 0.4  PROT  6.9  ALBUMIN 2.9*   No results for input(s): LIPASE, AMYLASE in the last 168 hours. No results for input(s): AMMONIA in the last 168 hours. Coagulation Profile: No results for input(s): INR, PROTIME in the last 168 hours. Cardiac Enzymes: No results for input(s): CKTOTAL, CKMB, CKMBINDEX, TROPONINI in the last 168 hours. BNP (last 3 results) No results for input(s): PROBNP in the last 8760 hours. HbA1C: No results for input(s): HGBA1C in the last 72 hours. CBG: Recent Labs  Lab 09/15/17 0227 09/15/17 0314 09/15/17 0423 09/15/17 0520 09/15/17 0559  GLUCAP 177* 117* 71 41* 173*   Lipid Profile: No results for input(s): CHOL, HDL, LDLCALC, TRIG, CHOLHDL, LDLDIRECT in the last 72 hours. Thyroid Function Tests: No results for input(s): TSH, T4TOTAL, FREET4, T3FREE, THYROIDAB in the last 72 hours. Anemia Panel: No results for input(s): VITAMINB12, FOLATE, FERRITIN, TIBC, IRON, RETICCTPCT in the last 72 hours. Urine analysis:    Component Value Date/Time   COLORURINE YELLOW 09/15/2017 0309   APPEARANCEUR HAZY (A) 09/15/2017 0309   LABSPEC  1.013 09/15/2017 0309   PHURINE 8.0 09/15/2017 0309   GLUCOSEU >=500 (A) 09/15/2017 0309   HGBUR SMALL (A) 09/15/2017 0309   BILIRUBINUR NEGATIVE 09/15/2017 0309   KETONESUR NEGATIVE 09/15/2017 0309   PROTEINUR >=300 (A) 09/15/2017 0309   UROBILINOGEN 0.2 02/20/2015 2223   NITRITE NEGATIVE 09/15/2017 0309   LEUKOCYTESUR SMALL (A) 09/15/2017 0309   Sepsis Labs: (procalcitonin:4,lacticidven:4) )No results found for this or any previous visit (from the past 240 hour(s)).   Radiological Exams on Admission: Dg Chest 2 View  Result Date: 09/15/2017 CLINICAL DATA:  Acute onset of altered mental status. Found unresponsive. EXAM: CHEST - 2 VIEW COMPARISON:  Chest radiograph performed 08/30/2017 FINDINGS: The lungs are well-aerated. Small bilateral pleural effusions are noted. Left basilar airspace opacity is similar in appearance and may reflect atelectasis or scarring. Peribronchial thickening is noted. Chronically increased interstitial markings are seen. There is no evidence of pneumothorax. The heart is enlarged.  No acute osseous abnormalities are seen. IMPRESSION: Small bilateral pleural effusions noted. Left basilar airspace opacity is similar in appearance and may reflect atelectasis or scarring. Chronically increased interstitial markings noted. Electronically Signed   By: Roanna Raider M.D.   On: 09/15/2017 02:57   Ct Head Wo Contrast  Result Date: 09/15/2017 CLINICAL DATA:  Altered mental status. Found unresponsive at nursing facility. EXAM: CT HEAD WITHOUT CONTRAST TECHNIQUE: Contiguous axial images were obtained from the base of the skull through the vertex without intravenous contrast. COMPARISON:  None. FINDINGS: Brain: There is no mass, hemorrhage or extra-axial collection. The size and configuration of the ventricles and extra-axial CSF spaces are normal. There is no acute or chronic infarction. The brain parenchyma is normal. Vascular: No abnormal hyperdensity of the major  intracranial arteries or dural venous sinuses. No intracranial atherosclerosis. Skull: The visualized skull base, calvarium and extracranial soft tissues are normal. Sinuses/Orbits: No fluid levels or advanced mucosal thickening of the visualized paranasal sinuses. No mastoid or middle ear effusion. The orbits are normal. IMPRESSION: Normal aging brain without acute intracranial abnormality. Electronically Signed   By: Deatra Robinson M.D.   On: 09/15/2017 03:15     EKG: Independently reviewed.  Sinus rhythm, QTC 493, nonspecific T wave change.  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 CT-head negative.  Currently patient is drowsy, but oriented x3.  Patient does not have focal neurologic findings on physical examination, less likely to have stroke.  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 Patient is taking Amaryl, NovoLog, Lantus at home -hold insulin and Amaryl due to hypoglycemia  GERD: -Protonix  History of CVA (cerebrovascular accident): not taking meds at home -no acute new issues  Chronic diastolic CHF (congestive heart failure) (HCC): 2D echo on 02/08/2018 showed EF 55 to 60% with grade 1 diastolic dysfunction.  Patient does not have leg edema or JVD.  CHF is compensated. -Volume management per renal.  End stage renal disease on dialysis due to type  2 diabetes mellitus (TTS): sodium 128, potassium 5.0, bicarbonate 29, creatinine 4.98, BUN 62 -please call renal in AM for HD.  Hyponatremia: Na 128.  -need HD today -check TSH  Possible UTI: -change foley -IV rocephin -f/u Bx and Ux   DVT ppx: SQ Heparin  Code Status: Full code Family Communication: None at bed side.   Disposition Plan:  Anticipate discharge back to [facility] Consults called:  none Admission status: SDU/obs  Date of Service 09/15/2017    Lorretta Harp [facility] Pager [phone]  If 7PM-7AM, please contact night-coverage [email or web address] Password [facility] 09/15/2017, 6:31 AM

## 2017-09-15 NOTE — Clinical Social Work Note (Signed)
Clinical Social Work Assessment  Patient Details  Name: Frances Reeves MRN: [id number] Date of Birth: 1950-07-16  Date of referral:  09/15/17               Reason for consult:  Facility Placement                Permission sought to share information with:  Family Supports Permission granted to share information::  Yes, Verbal Permission Granted  Name::     [name]   Agency::  family  Relationship::   daughter   Contact Information:  [name] [phone]  Housing/Transportation Living arrangements for the past 2 months:  Apartment(alone.) Source of Information:  Patient Patient Interpreter Needed:  Spanish Criminal Activity/Legal Involvement Pertinent to Current Situation/Hospitalization:  No - Comment as needed Significant Relationships:  Adult Children, Friend Lives with:  Self Do you feel safe going back to the place where you live?  Yes Need for family participation in patient care:  Yes (Comment)  Care giving concerns:  CSW spoke with pt at bedside via interpreter ([place]) [place]. CSW was informed that at this time pt denies having any concerns and is doing okay for the most part with care.    Social Worker assessment / plan:  CSW spoke with pt at bedside via spanish interpretor. CSW was informed that pt is from apartment in [place] where pt expressed that pt has lived at for 23 years. Pt reported that pt is originally from [place] and moved to the [place] years ago. Pt identified supports as family as well as friends. Pt reported that children live close by the area and come and see what pt needs are regularly. CSW was informed that if pt is needing rehab at the time of discharge then pt is agreeable to this.   During this assessment pt sat in bed eating breakfast. Pt was smiling and very pleasant to speak with.   Employment status:  Retired Health and safety inspector:  Medicare(PART B ONLY) PT Recommendations:  Not assessed at this  time Information / Referral to community resources:  Skilled Nursing Facility(spoke with pt about possible placement options for rehab.)  Patient/Family's Response to care:  Pt's response to care was appropriate for diagnosis given.   Patient/Family's Understanding of and Emotional Response to Diagnosis, Current Treatment, and Prognosis:  No further questions or concern have been presented to CSW at this time. Emotional response to care via pt was understanding ans accepting of plan of care.   Emotional Assessment Appearance:  Appears stated age Attitude/Demeanor/Rapport:  Engaged Affect (typically observed):  Appropriate, Pleasant Orientation:  Oriented to Self, Oriented to Place, Oriented to  Time, Oriented to Situation Alcohol / Substance use:  Not Applicable Psych involvement (Current and /or in the community):  No (Comment)  Discharge Needs  Concerns to be addressed:  Denies Needs/Concerns at this time Readmission within the last 30 days:  No Current discharge risk:  Dependent with Mobility, Other(dialyisis needs.) Barriers to Discharge:  Continued Medical Work up   [employer], LCSWA 09/15/2017, 7:56 AM

## 2017-09-15 NOTE — ED Notes (Signed)
Patient transported to X-ray 

## 2017-09-15 NOTE — ED Notes (Signed)
Renal with fluid restriction 1200 mL lunch tray ordered

## 2017-09-16 ENCOUNTER — Encounter: Payer: Self-pay | Admitting: Adult Health

## 2017-09-16 ENCOUNTER — Non-Acute Institutional Stay (SKILLED_NURSING_FACILITY): Payer: Medicare Other | Admitting: Adult Health

## 2017-09-16 DIAGNOSIS — N186 End stage renal disease: Secondary | ICD-10-CM | POA: Diagnosis not present

## 2017-09-16 DIAGNOSIS — T402X5A Adverse effect of other opioids, initial encounter: Secondary | ICD-10-CM | POA: Diagnosis not present

## 2017-09-16 DIAGNOSIS — N390 Urinary tract infection, site not specified: Secondary | ICD-10-CM

## 2017-09-16 DIAGNOSIS — E11649 Type 2 diabetes mellitus with hypoglycemia without coma: Secondary | ICD-10-CM | POA: Diagnosis not present

## 2017-09-16 DIAGNOSIS — Z992 Dependence on renal dialysis: Secondary | ICD-10-CM | POA: Diagnosis not present

## 2017-09-16 DIAGNOSIS — E1122 Type 2 diabetes mellitus with diabetic chronic kidney disease: Secondary | ICD-10-CM | POA: Diagnosis not present

## 2017-09-16 DIAGNOSIS — I5032 Chronic diastolic (congestive) heart failure: Secondary | ICD-10-CM | POA: Diagnosis not present

## 2017-09-16 DIAGNOSIS — E162 Hypoglycemia, unspecified: Secondary | ICD-10-CM | POA: Diagnosis not present

## 2017-09-16 DIAGNOSIS — E1165 Type 2 diabetes mellitus with hyperglycemia: Secondary | ICD-10-CM

## 2017-09-16 DIAGNOSIS — S32592S Other specified fracture of left pubis, sequela: Secondary | ICD-10-CM

## 2017-09-16 DIAGNOSIS — R4189 Other symptoms and signs involving cognitive functions and awareness: Secondary | ICD-10-CM | POA: Diagnosis not present

## 2017-09-16 DIAGNOSIS — K5903 Drug induced constipation: Secondary | ICD-10-CM

## 2017-09-16 DIAGNOSIS — R338 Other retention of urine: Secondary | ICD-10-CM | POA: Diagnosis not present

## 2017-09-16 DIAGNOSIS — I5033 Acute on chronic diastolic (congestive) heart failure: Secondary | ICD-10-CM

## 2017-09-16 DIAGNOSIS — K219 Gastro-esophageal reflux disease without esophagitis: Secondary | ICD-10-CM

## 2017-09-16 DIAGNOSIS — E871 Hypo-osmolality and hyponatremia: Secondary | ICD-10-CM | POA: Diagnosis not present

## 2017-09-16 DIAGNOSIS — Z8673 Personal history of transient ischemic attack (TIA), and cerebral infarction without residual deficits: Secondary | ICD-10-CM | POA: Diagnosis not present

## 2017-09-16 DIAGNOSIS — IMO0002 Reserved for concepts with insufficient information to code with codable children: Secondary | ICD-10-CM

## 2017-09-16 LAB — CBC
HCT: 32.2 % — ABNORMAL LOW (ref 36.0–46.0)
Hemoglobin: 10.3 g/dL — ABNORMAL LOW (ref 12.0–15.0)
MCH: 29.6 pg (ref 26.0–34.0)
MCHC: 32 g/dL (ref 30.0–36.0)
MCV: 92.5 fL (ref 78.0–100.0)
PLATELETS: 336 10*3/uL (ref 150–400)
RBC: 3.48 MIL/uL — ABNORMAL LOW (ref 3.87–5.11)
RDW: 14.4 % (ref 11.5–15.5)
WBC: 5.8 10*3/uL (ref 4.0–10.5)

## 2017-09-16 LAB — TSH: TSH: 3.273 u[IU]/mL (ref 0.350–4.500)

## 2017-09-16 LAB — BASIC METABOLIC PANEL
Anion gap: 12 (ref 5–15)
BUN: 23 mg/dL — AB (ref 6–20)
CO2: 28 mmol/L (ref 22–32)
CREATININE: 2.51 mg/dL — AB (ref 0.44–1.00)
Calcium: 7.9 mg/dL — ABNORMAL LOW (ref 8.9–10.3)
Chloride: 95 mmol/L — ABNORMAL LOW (ref 101–111)
GFR calc Af Amer: 21 mL/min — ABNORMAL LOW (ref >60)
GFR, EST NON AFRICAN AMERICAN: 18 mL/min — AB (ref >60)
GLUCOSE: 122 mg/dL — AB (ref 65–99)
POTASSIUM: 3.9 mmol/L (ref 3.5–5.1)
Sodium: 135 mmol/L (ref 135–145)

## 2017-09-16 LAB — HIV ANTIBODY (ROUTINE TESTING W REFLEX): HIV Screen 4th Generation wRfx: NONREACTIVE

## 2017-09-16 LAB — CBG MONITORING, ED
GLUCOSE-CAPILLARY: 134 mg/dL — AB (ref 65–99)
GLUCOSE-CAPILLARY: 73 mg/dL (ref 65–99)
GLUCOSE-CAPILLARY: 78 mg/dL (ref 65–99)
Glucose-Capillary: 159 mg/dL — ABNORMAL HIGH (ref 65–99)

## 2017-09-16 MED ORDER — INSULIN ASPART 100 UNIT/ML FLEXPEN: 0.0000 [IU] | PEN_INJECTOR | Freq: Three times a day (TID) | SUBCUTANEOUS | Status: DC

## 2017-09-16 MED ORDER — NON FORMULARY: 100.0000 mg | Status: DC

## 2017-09-16 MED ORDER — FOSFOMYCIN TROMETHAMINE 3 G PO PACK
3.0000 g | PACK | ORAL | Status: DC
  Administered 2017-09-16: 3 g via ORAL
  Filled 2017-09-16: qty 3

## 2017-09-16 MED ORDER — CEFPODOXIME PROXETIL 100 MG PO TABS: 100.0000 mg | ORAL_TABLET | ORAL | Status: DC

## 2017-09-16 MED ORDER — FOSFOMYCIN TROMETHAMINE 3 G PO PACK: 3.0000 g | PACK | Freq: Once | ORAL | Status: AC

## 2017-09-16 NOTE — Progress Notes (Signed)
CSW spoke with MD and confirmed that pt is able to ready to return to facility. CSW has faxed over discharge summary and called for transport. RN to call report to [phone]. CSW has updated family of transfer. All are agreeable. There are no further CSW needs. CSW will sign off.  Claude Manges. Tressa Maldonado, MSW, LCSW-A Emergency Department Clinical Social Worker [phone]

## 2017-09-16 NOTE — Progress Notes (Signed)
Location:   [facility] Room Number: 125 A Place of Service:  SNF (31)   CODE STATUS: Full Code  No Known Allergies  Chief Complaint  Patient presents with  . Hospitalization Follow-up    Hospital follow up    HPI:  She was taken to the ED for hypoglycemia and unresponsiveness. She had required a foley due to urine retention. Upon her admission to this facility she had not voided in an extended period of time; a foley was inserted and 1 L returned. She is due to see urology in the next week; who recommended that she keep the foley until seen by them. Her medications for her diabetes was adjusted with her lantus and amaryl was stopped. She is here for short term rehab. She has family present today. She is unable to fully participate in the ros; she does deny and abdominal/pelvic pain; no excessive thirst or hunger. She will continue to be followed for her chronic illnesses including: esrd; diabetes; pubic fracture. No nursing concerns at this time.    Past Medical History:  Diagnosis Date  . Anemia   . Chronic kidney disease   . Diabetes mellitus without complication ([place])   . GERD (gastroesophageal reflux disease)   . Stroke [facility])    in her 70's    Past Surgical History:  Procedure Laterality Date  . APPENDECTOMY    . BASCILIC VEIN TRANSPOSITION Left 11/24/2016   Procedure: FIRST STAGE BASCILIC VEIN TRANSPOSITION;  Surgeon: Waynetta Sandy, MD;  Location: [place];  Service: Vascular;  Laterality: Left;  . EYE SURGERY Bilateral    cataract surgery with lens implant  . INSERTION OF DIALYSIS CATHETER Right 11/24/2016   Procedure: INSERTION OF TUNNELED DIALYSIS CATHETER;  Surgeon: Waynetta Sandy, MD;  Location: [place];  Service: Vascular;  Laterality: Right;    Social History   Socioeconomic History  . Marital status: Widowed    Spouse name: Not on file  . Number of children: Not on file  . Years of education: Not on file  . Highest  education level: Not on file  Occupational History  . Not on file  Social Needs  . Financial resource strain: Not on file  . Food insecurity:    Worry: Not on file    Inability: Not on file  . Transportation needs:    Medical: Not on file    Non-medical: Not on file  Tobacco Use  . Smoking status: Never Smoker  . Smokeless tobacco: Never Used  Substance and Sexual Activity  . Alcohol use: No  . Drug use: No  . Sexual activity: Not on file  Lifestyle  . Physical activity:    Days per week: Not on file    Minutes per session: Not on file  . Stress: Not on file  Relationships  . Social connections:    Talks on phone: Not on file    Gets together: Not on file    Attends religious service: Not on file    Active member of club or organization: Not on file    Attends meetings of clubs or organizations: Not on file    Relationship status: Not on file  . Intimate partner violence:    Fear of current or ex partner: Not on file    Emotionally abused: Not on file    Physically abused: Not on file    Forced sexual activity: Not on file  Other Topics Concern  . Not on file  Social History Narrative  . Not on file   Family History  Problem Relation Age of Onset  . Diabetes Other   . CAD Neg Hx   . Cancer Neg Hx   . Renal Disease Neg Hx       VITAL SIGNS BP 104/62 Comment: obtained from hospital  Pulse 84 Comment: obtained from hospital  Resp 13 Comment: obtained from hospital  Ht 4' 10"  (1.473 m) Comment: obtained from hospital  Wt 95 lb 14 oz (43.5 kg) Comment: obtained from hospital  BMI 20.04 kg/m    Outpatient Encounter Medications as of 09/16/2017  Medication Sig  . acetaminophen (TYLENOL) 650 MG CR tablet Take 650 mg by mouth every 8 (eight) hours as needed for pain.  . Amino Acids-Protein Hydrolys (FEEDING SUPPLEMENT, PRO-STAT SUGAR FREE 64,) LIQD Take 30 mLs by mouth 2 (two) times daily.  Derrill Memo ON 09/19/2017] fosfomycin (MONUROL) 3 g PACK Take 3 g by mouth  once for 1 dose.  . insulin aspart (NOVOLOG FLEXPEN) 100 UNIT/ML FlexPen Inject 0-9 Units into the skin 3 (three) times daily with meals. CBG < 70: implement hypoglycemia protocol CBG 70 - 120: 0 units CBG 121 - 150: 1 unit CBG 151 - 200: 2 units CBG 201 - 250: 3 units CBG 251 - 300: 5 units CBG 301 - 350: 7 units CBG 351 - 400: 9 units CBG > 400: call MD.  . methocarbamol (ROBAXIN) 500 MG tablet Take 1 tablet (500 mg total) by mouth every 6 (six) hours as needed for muscle spasms.  . multivitamin (RENA-VIT) TABS tablet Take 1 tablet by mouth at bedtime.  . Nutritional Supplements (FEEDING SUPPLEMENT, NEPRO CARB STEADY,) LIQD Take 237 mLs by mouth 2 (two) times daily between meals.  . pantoprazole (PROTONIX) 40 MG tablet Take 1 tablet (40 mg total) by mouth daily.  . polyethylene glycol (MIRALAX / GLYCOLAX) packet Take 17 g by mouth daily as needed for mild constipation.  . calcium acetate (PHOSLO) 667 MG capsule Take 667 mg by mouth 3 (three) times daily with meals.     SIGNIFICANT DIAGNOSTIC EXAMS  PREVIOUS:  08-29-17 :bilateral hip and pelvis x-ray: Fractures through the superior and inferior pubic rami on the left.   08-29-17: lumbar spine x-ray: No acute abnormality in the lumbar spine.   08-30-17: chest x-ray: Stable cardiomegaly with aortic atherosclerosis. Chronic interstitial disease with subsegmental atelectasis and/or scarring within both lower lobes, left greater than right.  08-31-17: esophagram/ barium swallow: 1. Smooth, likely extrinsic narrowing of the esophagus adjacent to the enlarged heart. This seems pulsatile and I suspect that the appearance is primarily due to the esophageal positioning between the enlarged heart and spine. Mild fold thickening of the esophagus in this vicinity suggests mild-to-moderate esophagitis. 2. Scattered tertiary contractions in the esophagus but primary peristaltic waves were preserved on 3/4 swallows. 3. Please note that due to patient  frailty the typical protocol for esophagram could not be performed, and today's images were obtained with the patient laying down in the LPO position. This reduces the exams sensitivity and specificity compared to the normal comprehensive esophagram performed on patients able to stand, turn, and follow complex swallowing instructions. 4. Atherosclerotic calcification of the aortic arch.    TODAY:   09-15-17: chest x-ray: Small bilateral pleural effusions noted. Left basilar airspace opacity is similar in appearance and may reflect atelectasis or scarring. Chronically increased interstitial markings noted.  09-15-17: ct of head: Normal aging brain without acute intracranial abnormality.  LABS REVIEWED: PREVIOUS:   08-29-17: wbc 6.0; hgb 13.5; hct 40.5; mcv 92.3; plt 197; glucose 249; bun 65; creat 5.37; k+ 5.1; na++ 135; ca 8.4 08-30-17: albumin 3.2; vit D 29.3; hgb a1c 10.2 09-01-17: wbc 4.2; hgb 11.6; hct 35.5; mcv 92.9; plt 168; glucose 197; bun 54; creat 4.67; k+ 4.8; na++ 131; ca 7.4; phos 6.7; albumin 2.9 09-04-17: wbc 7.3; hgb 11.8; hct 36.4; mcv 91.0 ;plt 220; glucose 233; bun 66; creat 5.10 ;k+ 4.4; na++ 131; ca 8.1; phos 4.5; albumin 2.7   TODAY:   09-15-17: wbc 9.5; hgb 12.0; hct 37.3; mcv 92.1; plt 400; glucose 250; bun 62; creat 4.98; k+ 5.0; na++ 128; alk phos 133; albumin 2.9 hgb a1c 9.2 urine culture: klebsiella pneumoniae and enterococcus faecalis  09-16-17: tsh 3.273    Review of Systems  Unable to perform ROS: Language    Physical Exam  Constitutional: No distress.  Cachexia   Neck: No thyromegaly present.  Cardiovascular: Normal rate, regular rhythm and intact distal pulses.  Murmur heard. 1/6  Pulmonary/Chest: Effort normal and breath sounds normal. No respiratory distress.  Abdominal: Soft. Bowel sounds are normal. She exhibits no distension. There is no tenderness.  Musculoskeletal: She exhibits no edema.  Is able to move all extremities  Status post pelvic  fracture   Lymphadenopathy:    She has no cervical adenopathy.  Neurological: She is alert.  Skin: Skin is warm and dry. She is not diaphoretic.  Psychiatric: She has a normal mood and affect.    ASSESSMENT/ PLAN:  TODAY:   1.  Chronic diastolic heart failure stable: EF 55-60% (02-08-17): will not make changes will monitor her status.   2. GERD without esophagitis: stable will continue protonix 40 mg daily   3. 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 Left closed pubic ramus fracture: without change: will follow up with orthopedics as indicated will continue therapy as directed; has  robaxin 500 mg every 6 hours as needed for spasms  5. 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 Will continue phoslo 667 mg three times daily is followed by nephrology   6. Constipation due to opioid therapy: is stable will continue miralax daily as needed  7. Acute urine retention: stable has foley; awaiting urology appointment  7. UTI stable will give monurol 3 gm one time on 09-19-17.    MD is aware of resident's narcotic use and is in agreement with current plan of care. We will attempt to wean resident as apropriate   Ok Edwards NP [facility]  Contact [phone] Monday through Friday 8am- 5pm  After hours call [phone]

## 2017-09-16 NOTE — Progress Notes (Signed)
CSW received call informing CSW that if pt is able to return to facility then pt would be discharged. CSW spoke with Marcelino Duster at [facility] and was informed that pt can return to facility today. CSW spoke with MD [facility] and was informed that he would complete d/c summary and AVS in about an hour. CSW will continue to follow for getting to back to [facility].   Frances Reeves Rainy Rothman, MSW, LCSW-A Emergency Department Clinical Social Worker [phone]

## 2017-09-16 NOTE — Discharge Instructions (Signed)
Please get your medications reviewed and adjusted by your Primary MD. ° °Please request your Primary MD to go over all Hospital Tests and Procedure/Radiological results at the follow up, please get all Hospital records sent to your Prim MD by signing hospital release before you go home. ° °If you had Pneumonia of Lung problems at the Hospital: °Please get a 2 view Chest X ray done in 6-8 weeks after hospital discharge or sooner if instructed by your Primary MD. ° °If you have Congestive Heart Failure: °Please call your Cardiologist or Primary MD anytime you have any of the following symptoms:  °1) 3 pound weight gain in 24 hours or 5 pounds in 1 week  °2) shortness of breath, with or without a dry hacking cough  °3) swelling in the hands, feet or stomach  °4) if you have to sleep on extra pillows at night in order to breathe ° °Follow cardiac low salt diet and 1.5 lit/day fluid restriction. ° °If you have diabetes °Accuchecks 4 times/day, Once in AM empty stomach and then before each meal. °Log in all results and show them to your primary doctor at your next visit. °If any glucose reading is under 80 or above 300 call your primary MD immediately. ° °If you have Seizure/Convulsions/Epilepsy: °Please do not drive, operate heavy machinery, participate in activities at heights or participate in high speed sports until you have seen by Primary MD or a Neurologist and advised to do so again. ° °If you had Gastrointestinal Bleeding: °Please ask your Primary MD to check a complete blood count within one week of discharge or at your next visit. Your endoscopic/colonoscopic biopsies that are pending at the time of discharge, will also need to followed by your Primary MD. ° °Get Medicines reviewed and adjusted. °Please take all your medications with you for your next visit with your Primary MD ° °Please request your Primary MD to go over all hospital tests and procedure/radiological results at the follow up, please ask your  Primary MD to get all Hospital records sent to his/her office. ° °If you experience worsening of your admission symptoms, develop shortness of breath, life threatening emergency, suicidal or homicidal thoughts you must seek medical attention immediately by calling 911 or calling your MD immediately  if symptoms less severe. ° °You must read complete instructions/literature along with all the possible adverse reactions/side effects for all the Medicines you take and that have been prescribed to you. Take any new Medicines after you have completely understood and accpet all the possible adverse reactions/side effects.  ° °Do not drive or operate heavy machinery when taking Pain medications.  ° °Do not take more than prescribed Pain, Sleep and Anxiety Medications ° °Special Instructions: If you have smoked or chewed Tobacco  in the last 2 yrs please stop smoking, stop any regular Alcohol  and or any Recreational drug use. ° °Wear Seat belts while driving. ° °Please note °You were cared for by a hospitalist during your hospital stay. If you have any questions about your discharge medications or the care you received while you were in the hospital after you are discharged, you can call the unit and asked to speak with the hospitalist on call if the hospitalist that took care of you is not available. Once you are discharged, your primary care physician will handle any further medical issues. Please note that NO REFILLS for any discharge medications will be authorized once you are discharged, as it is imperative that you   return to your primary care physician (or establish a relationship with a primary care physician if you do not have one) for your aftercare needs so that they can reassess your need for medications and monitor your lab values.  You can reach the hospitalist office at phone [phone] or fax [phone]   If you do not have a primary care physician, you can call [phone] for a physician  referral.   Hypoglycemia Hypoglycemia occurs when the level of sugar (glucose) in the blood is too low. Glucose is a type of sugar that provides the body's main source of energy. Certain hormones (insulin and glucagon) control the level of glucose in the blood. Insulin lowers blood glucose, and glucagon increases blood glucose. Hypoglycemia can result from having too much insulin in the bloodstream, or from not eating enough food that contains glucose. Hypoglycemia can happen in people who do or do not have diabetes. It can develop quickly, and it can be a medical emergency. What are the causes? Hypoglycemia occurs most often in people who have diabetes. If you have diabetes, hypoglycemia may be caused by:  Diabetes medicine.  Not eating enough, or not eating often enough.  Increased physical activity.  Drinking alcohol, especially when you have not eaten recently.  If you do not have diabetes, hypoglycemia may be caused by:  A tumor in the pancreas. The pancreas is the organ that makes insulin.  Not eating enough, or not eating for long periods at a time (fasting).  Severe infection or illness that affects the liver, heart, or kidneys.  Certain medicines.  You may also have reactive hypoglycemia. This condition causes hypoglycemia within 4 hours of eating a meal. This may occur after having stomach surgery. Sometimes, the cause of reactive hypoglycemia is not known. What increases the risk? Hypoglycemia is more likely to develop in:  People who have diabetes and take medicines to lower blood glucose.  People who abuse alcohol.  People who have a severe illness.  What are the signs or symptoms? Hypoglycemia may not cause any symptoms. If you have symptoms, they may include:  Hunger.  Anxiety.  Sweating and feeling clammy.  Confusion.  Dizziness or feeling light-headed.  Sleepiness.  Nausea.  Increased heart rate.  Headache.  Blurry  vision.  Seizure.  Nightmares.  Tingling or numbness around the mouth, lips, or tongue.  A change in speech.  Decreased ability to concentrate.  A change in coordination.  Restless sleep.  Tremors or shakes.  Fainting.  Irritability.  How is this diagnosed? Hypoglycemia is diagnosed with a blood test to measure your blood glucose level. This blood test is done while you are having symptoms. Your health care provider may also do a physical exam and review your medical history. If you do not have diabetes, other tests may be done to find the cause of your hypoglycemia. How is this treated? This condition can often be treated by immediately eating or drinking something that contains glucose, such as:  3-4 sugar tablets (glucose pills).  Glucose gel, 15-gram tube.  Fruit juice, 4 oz (120 mL).  Regular soda (not diet soda), 4 oz (120 mL).  Low-fat milk, 4 oz (120 mL).  Several pieces of hard candy.  Sugar or honey, 1 Tbsp.  Treating Hypoglycemia If You Have Diabetes  If you are alert and able to swallow safely, follow the 15:15 rule:  Take 15 grams of a rapid-acting carbohydrate. Rapid-acting options include: ? 1 tube of glucose gel. ? 3 glucose  pills. ? 6-8 pieces of hard candy. ? 4 oz (120 mL) of fruit juice. ? 4 oz (120 ml) of regular (not diet) soda.  Check your blood glucose 15 minutes after you take the carbohydrate.  If the repeat blood glucose level is still at or below 70 mg/dL (3.9 mmol/L), take 15 grams of a carbohydrate again.  If your blood glucose level does not increase above 70 mg/dL (3.9 mmol/L) after 3 tries, seek emergency medical care.  After your blood glucose level returns to normal, eat a meal or a snack within 1 hour.  Treating Severe Hypoglycemia Severe hypoglycemia is when your blood glucose level is at or below 54 mg/dL (3 mmol/L). Severe hypoglycemia is an emergency. Do not wait to see if the symptoms will go away. Get medical help  right away. Call your local emergency services (911 in the [place] Do not drive yourself to the hospital. If you have severe hypoglycemia and you cannot eat or drink, you may need an injection of glucagon. A family member or close friend should learn how to check your blood glucose and how to give you a glucagon injection. Ask your health care provider if you need to have an emergency glucagon injection kit available. Severe hypoglycemia may need to be treated in a hospital. The treatment may include getting glucose through an IV tube. You may also need treatment for the cause of your hypoglycemia. Follow these instructions at home: General instructions  Avoid any diets that cause you to not eat enough food. Talk with your health care provider before you start any new diet.  Take over-the-counter and prescription medicines only as told by your health care provider.  Limit alcohol intake to no more than 1 drink per day for nonpregnant women and 2 drinks per day for men. One drink equals 12 oz of beer, 5 oz of wine, or 1 oz of hard liquor.  Keep all follow-up visits as told by your health care provider. This is important. If You Have Diabetes:   Make sure you know the symptoms of hypoglycemia.  Always have a rapid-acting carbohydrate snack with you to treat low blood sugar.  Follow your diabetes management plan, as told by your health care provider. Make sure you: ? Take your medicines as directed. ? Follow your exercise plan. ? Follow your meal plan. Eat on time, and do not skip meals. ? Check your blood glucose as often as directed. Make sure to check your blood glucose before and after exercise. If you exercise longer or in a different way than usual, check your blood glucose more often. ? Follow your sick day plan whenever you cannot eat or drink normally. Make this plan in advance with your health care provider.  Share your diabetes management plan with people in your workplace, school,  and household.  Check your urine for ketones when you are ill and as told by your health care provider.  Carry a medical alert card or wear medical alert jewelry. If You Have Reactive Hypoglycemia or Low Blood Sugar From Other Causes:  Monitor your blood glucose as told by your health care provider.  Follow instructions from your health care provider about eating or drinking restrictions. Contact a health care provider if:  You have problems keeping your blood glucose in your target range.  You have frequent episodes of hypoglycemia. Get help right away if:  You continue to have hypoglycemia symptoms after eating or drinking something containing glucose.  Your blood glucose  is at or below 54 mg/dL (3 mmol/L).  You have a seizure.  You faint. These symptoms may represent a serious problem that is an emergency. Do not wait to see if the symptoms will go away. Get medical help right away. Call your local emergency services (911 in the [place] Do not drive yourself to the hospital. This information is not intended to replace advice given to you by your health care provider. Make sure you discuss any questions you have with your health care provider. Document Released: 04/07/2005 Document Revised: 09/19/2015 Document Reviewed: 05/11/2015 Elsevier Interactive Patient Education  [employer].

## 2017-09-16 NOTE — Discharge Summary (Signed)
Physician Discharge Summary  Frances Reeves LKG:[id number] DOB: 1951/01/12  PCP: Frances Reeves, No Pcp Per  Admit date: 09/15/2017 Discharge date: 09/16/2017  Recommendations for Outpatient Follow-up:  1. MD at SNF in 2 days.  Please follow final blood and urine culture results that were sent from the hospital. 2. Frances Reeves has indwelling Foley catheter from prior to admission, unclear etiology.  Recommend discontinuing Foley catheter at SNF and perform voiding trial and if has issues with ongoing urinary retention, recommend outpatient urology consultation. 3. Hemodialysis Center: Keep regular HD appointments on Tues/Thurs/Sat's. Next appointment on 09/17/2017. 4. Consider outpatient barium swallow to evaluate possible dysphagia (recommendation from recent hospital discharge).  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  Of note, upon recent discharge, she was only on oral hypoglycemics.  Since admission to rehab, Lantus and mealtime NovoLog had been initiated.  Frances Reeves was treated with oral  glucose, glucagon and D10 prior to hospital arrival.  In the ED CBG 59.  Frances Reeves was given D50 IV and started on IV D10 infusion. She was admitted for further evaluation and management.   Assessment and plan:  1. Type II DM with hypoglycemia: A1c 9.2 suggesting very poor control.  At recent discharge, Frances Reeves was only on Amaryl 4 mg daily.  Since then at rehab, she had been started on Lantus and mealtime NovoLog.  Hypoglycemia likely related to oral hypoglycemics and newly started insulins in the context of ESRD.  Treated as above initially with D50 IV push followed by D10 IV infusion.  Frances Reeves reports normal oral intake.  D5 infusion was discontinued and CBGs were monitored closely with oral intake alone.  No further hypoglycemia for last 24 hours.  Recommend discontinuing Amaryl indefinitely.  For now discontinued Lantus as well.  NovoLog changed to SSI.  Monitor CBGs closely at SNF and if and when she develops persistent hyperglycemia >200, may consider reinitiation of low-dose Lantus. 2. Unresponsiveness: Most likely due to hypoglycemia.  CT head negative.  No focal neurological deficits.  Resolved. 3. ESRD on TTS HD: Nephrology was consulted and underwent HD on 5/28.  Discussed with Nephrology who have cleared her for discharge to SNF.  Next HD as outpatient will be on 5/30. 4. Anemia in ESRD: Relatively stable.  Periodically follow CBCs across dialysis. 5. Chronic diastolic CHF: TTE 02/08/2017: LVEF 55-60% and grade 1 diastolic dysfunction.  Clinically compensated. 6. GERD: Continue PPI. 7. Hyponatremia: Presented with sodium of 128.  Likely related to ESRD.  Resolved post dialysis.  TSH normal. 8. History of CVA: No focal deficits.  Not on antiplatelets PTA, may consider as outpatient. 9. Acute complicated cystitis: Frances Reeves has a  Foley catheter prior to this admission.  She did receive 2 doses of IV ceftriaxone.  Urine culture showed >100 K Klebsiella pneumonia and >100 K Enterococcus faecalis.   Urine culture results as indicated below, need to follow final results.  As discussed with pharmacy, transitioned to oral fosfomycin to receive a dose prior to hospital discharge followed by last dose 72 hours later on 09/19/2017. 10. Closed left pubic ramus fracture: Managed nonsurgically.  Continue rehab at SNF. 11. Dysphagia: During prior hospitalization, esophagogram showed tertiary contractions and esophagitis but the study was apparently not accurate due to inability to stand up.  As per prior recommendations, the study will need to be repeated when she is more mobile and able to be upright and can be pursued as outpatient.  Continue PPI.  Reportedly tolerating diet without difficulty. 12. Indwelling Foley catheter: Unclear reason as to why this was placed at SNF.  Recommend discontinuing and monitoring for voiding.  If has issues with urinary retention, recommend outpatient urology consultation.  Frances Reeves has microscopic hematuria which may be related to UTI or the Foley catheter itself.  Recommend repeating urine microscopy in 2 to 3 weeks.   Consultations:  Nephrology  Procedures:  HD   Discharge Instructions  Discharge Instructions    Call MD for:   Complete by:  As directed    Altered mental status, recurrent hypoglycemia.   Call MD for:  extreme fatigue   Complete by:  As directed    Call MD for:  persistant dizziness or light-headedness   Complete by:  As directed    Diet - low sodium heart healthy   Complete by:  As directed    Diet Carb Modified   Complete by:  As directed    Increase activity slowly   Complete by:  As directed        Medication List    STOP taking these medications   glimepiride 4 MG tablet Commonly known as:  AMARYL   LANTUS SOLOSTAR 100 UNIT/ML Solostar Pen Generic drug:  Insulin Glargine     TAKE these medications   acetaminophen 650 MG CR tablet Commonly known as:  TYLENOL Take 650 mg by mouth every 8 (eight) hours as needed for pain.    calcium acetate 667 MG capsule Commonly known as:  PHOSLO Take 667 mg by mouth 3 (three) times daily with meals.   feeding supplement (NEPRO CARB STEADY) Liqd Take 237 mLs by mouth 2 (two) times daily between meals.   feeding supplement (PRO-STAT SUGAR FREE 64) Liqd Take 30 mLs by mouth 2 (two) times daily.   fosfomycin 3 g Pack Commonly known as:  MONUROL Take 3 g by mouth once for 1 dose. Start taking on:  09/19/2017   insulin aspart 100 UNIT/ML FlexPen Commonly known as:  NOVOLOG FLEXPEN Inject 0-9 Units into the skin 3 (three) times daily with meals. CBG < 70: implement hypoglycemia protocol CBG 70 - 120: 0 units CBG 121 - 150: 1 unit CBG 151 - 200: 2 units CBG 201 - 250: 3 units CBG 251 - 300: 5 units CBG 301 - 350: 7 units CBG 351 - 400: 9 units CBG > 400: call MD. What changed:    how much to take  when to take this  additional instructions   methocarbamol 500 MG tablet Commonly known as:  ROBAXIN Take 1 tablet (500 mg total) by mouth every 6 (six) hours as needed for muscle spasms.   multivitamin Tabs tablet Take 1 tablet  by mouth at bedtime.   pantoprazole 40 MG tablet Commonly known as:  PROTONIX Take 1 tablet (40 mg total) by mouth daily.   polyethylene glycol packet Commonly known as:  MIRALAX / GLYCOLAX Take 17 g by mouth daily as needed for mild constipation.      Follow-up Information    MD at SNF. Schedule an appointment as soon as possible for a visit in 2 day(s).   Why:  Monitor CBGs closely and adjust medications as needed.       Hemodialysis Center. Schedule an appointment as soon as possible for a visit.   Why:  Continue regular hemodialysis appointments on Tuesdays, Thursdays and Saturdays.  Next appointment on 09/17/2017.         No Known Allergies    Procedures/Studies: Dg Chest 2 View  Result Date: 09/15/2017 CLINICAL DATA:  Acute onset of altered mental status. Found unresponsive. EXAM: CHEST - 2 VIEW COMPARISON:  Chest  radiograph performed 08/30/2017 FINDINGS: The lungs are well-aerated. Small bilateral pleural effusions are noted. Left basilar airspace opacity is similar in appearance and may reflect atelectasis or scarring. Peribronchial thickening is noted. Chronically increased interstitial markings are seen. There is no evidence of pneumothorax. The heart is enlarged.  No acute osseous abnormalities are seen. IMPRESSION: Small bilateral pleural effusions noted. Left basilar airspace opacity is similar in appearance and may reflect atelectasis or scarring. Chronically increased interstitial markings noted. Electronically Signed   By: Roanna Raider M.D.   On: 09/15/2017 02:57   Ct Head Wo Contrast  Result Date: 09/15/2017 CLINICAL DATA:  Altered mental status. Found unresponsive at nursing facility. EXAM: CT HEAD WITHOUT CONTRAST TECHNIQUE: Contiguous axial images were obtained from the base of the skull through the vertex without intravenous contrast. COMPARISON:  None. FINDINGS: Brain: There is no mass, hemorrhage or extra-axial collection. The size and configuration of the ventricles and extra-axial CSF spaces are normal. There is no acute or chronic infarction. The brain parenchyma is normal. Vascular: No abnormal hyperdensity of the major intracranial arteries or dural venous sinuses. No intracranial atherosclerosis. Skull: The visualized skull base, calvarium and extracranial soft tissues are normal. Sinuses/Orbits: No fluid levels or advanced mucosal thickening of the visualized paranasal sinuses. No mastoid or middle ear effusion. The orbits are normal. IMPRESSION: Normal aging brain without acute intracranial abnormality. Electronically Signed   By: Deatra Robinson M.D.   On: 09/15/2017 03:15     Subjective: Frances Reeves interviewed and examined with the help of video interpreter [phone]  Frances Reeves reported mild dysuria.  Has indwelling Foley catheter and is making urine.  Denies any other complaints.  Reports that  prior to recent admission to hospital and SNF, she was on only oral medications for her diabetes.  She apparently has been ambulating with the help of walker and wheelchair at SNF.  No fever, chills or abdominal pain reported.  Last BM was 3 days ago.  Discharge Exam:  Vitals:   09/16/17 0600 09/16/17 0641 09/16/17 0645 09/16/17 0730  BP:  (!) 135/56 136/60 (!) 131/55  Pulse: 93 (!) 101 (!) 105 95  Resp: Temp:      TempSrc:      SpO2: 97% 96% 98% 97%  Weight:        General: Pleasant middle-aged female, moderately built and nourished, lying comfortably propped up in bed.  Oral mucosa moist. Cardiovascular: S1 & S2 heard, RRR, S1/S2 +. No murmurs, rubs, gallops or clicks. No JVD or pedal edema.  Telemetry personally reviewed: Sinus rhythm. Respiratory: Clear to auscultation without wheezing, rhonchi or crackles. No increased work of breathing. Abdominal:  Non distended, non tender & soft. No organomegaly or masses appreciated. Normal bowel sounds heard. CNS: Alert and oriented. No focal deficits. Extremities: no edema, no cyanosis.  Left upper extremity AV fistula with good thrill.  Healing scab over right mid shin.    The results of significant diagnostics from this hospitalization (including imaging, microbiology, ancillary and laboratory) are listed below for reference.     Microbiology: Recent Results (from the past 240 hour(s))  Urine Culture     Status: Abnormal (Preliminary result)   Collection Time: 09/15/17  3:10 AM  Result Value Ref Range Status   Specimen Description URINE, CATHETERIZED  Final   Special Requests NONE  Final   Culture (A)  Final    >=100,000 COLONIES/mL KLEBSIELLA PNEUMONIAE >=100,000 COLONIES/mL ENTEROCOCCUS FAECALIS SUSCEPTIBILITIES TO FOLLOW Performed at [facility], [address] [place], [place] [address]    Report Status PENDING  Incomplete     Labs: CBC: Recent Labs  Lab 09/15/17 0215 09/16/17 0411  WBC 9.5 5.8   NEUTROABS 8.1*  --   HGB 12.0 10.3*  HCT 37.3 32.2*  MCV 92.1 92.5  PLT 400 336   Basic Metabolic Panel: Recent Labs  Lab 09/15/17 0215 09/16/17 0411  NA 128* 135  K 5.0 3.9  CL 87* 95*  CO2 29 28  GLUCOSE 250* 122*  BUN 62* 23*  CREATININE 4.98* 2.51*  CALCIUM 8.5* 7.9*   Liver Function Tests: Recent Labs  Lab 09/15/17 0215  AST 23  ALT 14  ALKPHOS 133*  BILITOT 0.4  PROT 6.9  ALBUMIN 2.9*   CBG: Recent Labs  Lab 09/15/17 1945 09/15/17 2146 09/16/17 0112 09/16/17 0547 09/16/17 0726  GLUCAP 93 179* 134* 73 78   Hgb A1c Recent Labs    09/15/17 1940  HGBA1C 9.2*   Thyroid function studies Recent Labs    09/16/17 0411  TSH 3.273    Urinalysis    Component Value Date/Time   COLORURINE YELLOW 09/15/2017 0309   APPEARANCEUR HAZY (A) 09/15/2017 0309   LABSPEC 1.013 09/15/2017 0309   PHURINE 8.0 09/15/2017 0309   GLUCOSEU >=500 (A) 09/15/2017 0309   HGBUR SMALL (A) 09/15/2017 0309   BILIRUBINUR NEGATIVE 09/15/2017 0309   KETONESUR NEGATIVE 09/15/2017 0309   PROTEINUR >=300 (A) 09/15/2017 0309   UROBILINOGEN 0.2 02/20/2015 2223   NITRITE NEGATIVE 09/15/2017 0309   LEUKOCYTESUR SMALL (A) 09/15/2017 0309      Time coordinating discharge: 40 minutes  SIGNED:  Marcellus Scott, MD, FACP, [facility]. [facility] Pager [phone]  If 7PM-7AM, please contact night-coverage [email or web address] Password [facility] 09/16/2017, 10:56 AM

## 2017-09-16 NOTE — ED Notes (Signed)
Pt sitting up in stretcher eating breakfast, denies complaints at this time

## 2017-09-16 NOTE — ED Notes (Signed)
Never DC out of system by primary RN

## 2017-09-17 ENCOUNTER — Ambulatory Visit (INDEPENDENT_AMBULATORY_CARE_PROVIDER_SITE_OTHER): Payer: Self-pay | Admitting: Orthopaedic Surgery

## 2017-09-17 LAB — URINE CULTURE

## 2017-09-18 ENCOUNTER — Telehealth: Payer: Self-pay | Admitting: *Deleted

## 2017-09-18 NOTE — Telephone Encounter (Signed)
Post ED Visit - Positive Culture Follow-up  Culture report reviewed by antimicrobial stewardship pharmacist:  []  Enzo BiNathan Batchelder, Pharm.D. []  Celedonio MiyamotoJeremy Frens, Pharm.D., BCPS AQ-ID []  Garvin FilaMike Maccia, Pharm.D., BCPS []  Georgina PillionElizabeth Martin, [address]Ph[address]D., BCPS []  MakawaoMinh Pham, [address]Pharm[address]D., BCPS, AAHIVP []  Estella HuskMichelle Turner, Pharm.D., BCPS, AAHIVP []  Lysle Pearlachel Rumbarger, PharmD, BCPS [x]  Sherlynn CarbonAustin Lucas, PharmD []  Pollyann SamplesAndy Johnston, PharmD, BCPS  Positive urine culture Treated with Fosfomycin, organism sensitive to the same and no further patient follow-up is required at this time.  Virl AxeRobertson, Braeleigh Pyper [facility] 09/18/2017, 10:43 AM

## 2017-09-19 ENCOUNTER — Encounter (HOSPITAL_COMMUNITY): Payer: Self-pay

## 2017-09-19 ENCOUNTER — Emergency Department (HOSPITAL_COMMUNITY): Payer: Medicare Other

## 2017-09-19 ENCOUNTER — Observation Stay (HOSPITAL_COMMUNITY)
Admission: EM | Admit: 2017-09-19 | Discharge: 2017-09-21 | Disposition: A | Discharge status: Home / Self Care | Payer: Medicare Other | Attending: Internal Medicine | Admitting: Internal Medicine

## 2017-09-19 DIAGNOSIS — I509 Heart failure, unspecified: Secondary | ICD-10-CM

## 2017-09-19 DIAGNOSIS — R0789 Other chest pain: Secondary | ICD-10-CM | POA: Diagnosis present

## 2017-09-19 DIAGNOSIS — R42 Dizziness and giddiness: Secondary | ICD-10-CM | POA: Diagnosis not present

## 2017-09-19 DIAGNOSIS — N2581 Secondary hyperparathyroidism of renal origin: Secondary | ICD-10-CM | POA: Diagnosis not present

## 2017-09-19 DIAGNOSIS — E1165 Type 2 diabetes mellitus with hyperglycemia: Secondary | ICD-10-CM

## 2017-09-19 DIAGNOSIS — S32599A Other specified fracture of unspecified pubis, initial encounter for closed fracture: Secondary | ICD-10-CM | POA: Diagnosis present

## 2017-09-19 DIAGNOSIS — Z87440 Personal history of urinary (tract) infections: Secondary | ICD-10-CM | POA: Insufficient documentation

## 2017-09-19 DIAGNOSIS — I5032 Chronic diastolic (congestive) heart failure: Secondary | ICD-10-CM | POA: Diagnosis present

## 2017-09-19 DIAGNOSIS — E11649 Type 2 diabetes mellitus with hypoglycemia without coma: Secondary | ICD-10-CM | POA: Diagnosis not present

## 2017-09-19 DIAGNOSIS — R339 Retention of urine, unspecified: Secondary | ICD-10-CM | POA: Diagnosis not present

## 2017-09-19 DIAGNOSIS — N186 End stage renal disease: Secondary | ICD-10-CM | POA: Insufficient documentation

## 2017-09-19 DIAGNOSIS — E8770 Fluid overload, unspecified: Secondary | ICD-10-CM | POA: Diagnosis not present

## 2017-09-19 DIAGNOSIS — R5381 Other malaise: Secondary | ICD-10-CM | POA: Diagnosis not present

## 2017-09-19 DIAGNOSIS — E1122 Type 2 diabetes mellitus with diabetic chronic kidney disease: Secondary | ICD-10-CM

## 2017-09-19 DIAGNOSIS — Z992 Dependence on renal dialysis: Secondary | ICD-10-CM | POA: Diagnosis not present

## 2017-09-19 DIAGNOSIS — R1310 Dysphagia, unspecified: Secondary | ICD-10-CM | POA: Diagnosis not present

## 2017-09-19 DIAGNOSIS — R0600 Dyspnea, unspecified: Secondary | ICD-10-CM | POA: Diagnosis not present

## 2017-09-19 DIAGNOSIS — R748 Abnormal levels of other serum enzymes: Secondary | ICD-10-CM | POA: Diagnosis not present

## 2017-09-19 DIAGNOSIS — S32502A Unspecified fracture of left pubis, initial encounter for closed fracture: Secondary | ICD-10-CM | POA: Insufficient documentation

## 2017-09-19 DIAGNOSIS — IMO0002 Reserved for concepts with insufficient information to code with codable children: Secondary | ICD-10-CM | POA: Diagnosis present

## 2017-09-19 DIAGNOSIS — K219 Gastro-esophageal reflux disease without esophagitis: Secondary | ICD-10-CM | POA: Diagnosis present

## 2017-09-19 DIAGNOSIS — X58XXXA Exposure to other specified factors, initial encounter: Secondary | ICD-10-CM | POA: Insufficient documentation

## 2017-09-19 DIAGNOSIS — I5033 Acute on chronic diastolic (congestive) heart failure: Secondary | ICD-10-CM | POA: Diagnosis not present

## 2017-09-19 DIAGNOSIS — Z794 Long term (current) use of insulin: Secondary | ICD-10-CM | POA: Diagnosis not present

## 2017-09-19 DIAGNOSIS — I313 Pericardial effusion (noninflammatory): Secondary | ICD-10-CM | POA: Diagnosis not present

## 2017-09-19 DIAGNOSIS — I3139 Other pericardial effusion (noninflammatory): Secondary | ICD-10-CM | POA: Diagnosis present

## 2017-09-19 DIAGNOSIS — I132 Hypertensive heart and chronic kidney disease with heart failure and with stage 5 chronic kidney disease, or end stage renal disease: Secondary | ICD-10-CM | POA: Insufficient documentation

## 2017-09-19 DIAGNOSIS — R0602 Shortness of breath: Secondary | ICD-10-CM | POA: Diagnosis not present

## 2017-09-19 DIAGNOSIS — Z8673 Personal history of transient ischemic attack (TIA), and cerebral infarction without residual deficits: Secondary | ICD-10-CM

## 2017-09-19 DIAGNOSIS — R079 Chest pain, unspecified: Secondary | ICD-10-CM | POA: Diagnosis present

## 2017-09-19 DIAGNOSIS — D649 Anemia, unspecified: Secondary | ICD-10-CM | POA: Insufficient documentation

## 2017-09-19 DIAGNOSIS — J90 Pleural effusion, not elsewhere classified: Secondary | ICD-10-CM

## 2017-09-19 LAB — COMPREHENSIVE METABOLIC PANEL
ALK PHOS: 135 U/L — AB (ref 38–126)
ALT: 13 U/L — AB (ref 14–54)
AST: 21 U/L (ref 15–41)
Albumin: 2.9 g/dL — ABNORMAL LOW (ref 3.5–5.0)
Anion gap: 14 (ref 5–15)
BILIRUBIN TOTAL: 0.6 mg/dL (ref 0.3–1.2)
BUN: 40 mg/dL — ABNORMAL HIGH (ref 6–20)
CALCIUM: 8.5 mg/dL — AB (ref 8.9–10.3)
CO2: 27 mmol/L (ref 22–32)
CREATININE: 3.98 mg/dL — AB (ref 0.44–1.00)
Chloride: 90 mmol/L — ABNORMAL LOW (ref 101–111)
GFR calc Af Amer: 12 mL/min — ABNORMAL LOW (ref >60)
GFR calc non Af Amer: 11 mL/min — ABNORMAL LOW (ref >60)
Glucose, Bld: 152 mg/dL — ABNORMAL HIGH (ref 65–99)
Potassium: 4.4 mmol/L (ref 3.5–5.1)
SODIUM: 131 mmol/L — AB (ref 135–145)
TOTAL PROTEIN: 6.6 g/dL (ref 6.5–8.1)

## 2017-09-19 LAB — I-STAT TROPONIN, ED
Troponin i, poc: 0.06 ng/mL (ref 0.00–0.08)
Troponin i, poc: 0.03 ng/mL (ref 0.00–0.08)

## 2017-09-19 LAB — URINALYSIS, ROUTINE W REFLEX MICROSCOPIC
BILIRUBIN URINE: NEGATIVE
Glucose, UA: >=500 mg/dL — AB
KETONES UR: NEGATIVE mg/dL
NITRITE: NEGATIVE
PH: 8 (ref 5.0–8.0)
Protein, ur: >=300 mg/dL — AB
Specific Gravity, Urine: 1.014 (ref 1.005–1.030)
Trans Epithel, UA: 2

## 2017-09-19 LAB — CBC WITH DIFFERENTIAL/PLATELET
ABS IMMATURE GRANULOCYTES: 0 10*3/uL (ref 0.0–0.1)
Basophils Absolute: 0.1 10*3/uL (ref 0.0–0.1)
Basophils Relative: 1 %
Eosinophils Absolute: 0.1 10*3/uL (ref 0.0–0.7)
Eosinophils Relative: 2 %
HEMATOCRIT: 33.4 % — AB (ref 36.0–46.0)
HEMOGLOBIN: 10.8 g/dL — AB (ref 12.0–15.0)
Immature Granulocytes: 0 %
LYMPHS ABS: 1.1 10*3/uL (ref 0.7–4.0)
LYMPHS PCT: 20 %
MCH: 29.5 pg (ref 26.0–34.0)
MCHC: 32.3 g/dL (ref 30.0–36.0)
MCV: 91.3 fL (ref 78.0–100.0)
MONO ABS: 0.4 10*3/uL (ref 0.1–1.0)
MONOS PCT: 7 %
Neutro Abs: 3.8 10*3/uL (ref 1.7–7.7)
Neutrophils Relative %: 70 %
Platelets: 328 10*3/uL (ref 150–400)
RBC: 3.66 MIL/uL — ABNORMAL LOW (ref 3.87–5.11)
RDW: 14 % (ref 11.5–15.5)
WBC: 5.4 10*3/uL (ref 4.0–10.5)

## 2017-09-19 LAB — I-STAT CG4 LACTIC ACID, ED: Lactic Acid, Venous: 2.37 mmol/L (ref 0.5–1.9)

## 2017-09-19 LAB — CBG MONITORING, ED: Glucose-Capillary: 159 mg/dL — ABNORMAL HIGH (ref 65–99)

## 2017-09-19 MED ORDER — SODIUM CHLORIDE 0.9% FLUSH
3.0000 mL | Freq: Two times a day (BID) | INTRAVENOUS | Status: DC
  Administered 2017-09-20 (×3): 3 mL via INTRAVENOUS

## 2017-09-19 MED ORDER — CHLORHEXIDINE GLUCONATE CLOTH 2 % EX PADS
6.0000 | MEDICATED_PAD | Freq: Every day | CUTANEOUS | Status: DC
  Administered 2017-09-20: 6 via TOPICAL

## 2017-09-19 MED ORDER — ASPIRIN EC 81 MG PO TBEC
81.0000 mg | DELAYED_RELEASE_TABLET | Freq: Every day | ORAL | Status: DC
  Administered 2017-09-20 – 2017-09-21 (×2): 81 mg via ORAL
  Filled 2017-09-19 (×2): qty 1

## 2017-09-19 MED ORDER — ACETAMINOPHEN 325 MG PO TABS
650.0000 mg | ORAL_TABLET | Freq: Four times a day (QID) | ORAL | Status: DC | PRN
  Administered 2017-09-21: 650 mg via ORAL

## 2017-09-19 MED ORDER — INSULIN ASPART 100 UNIT/ML ~~LOC~~ SOLN: 0.0000 [IU] | Freq: Every day | SUBCUTANEOUS | Status: DC

## 2017-09-19 MED ORDER — MECLIZINE HCL 25 MG PO TABS: 12.5000 mg | ORAL_TABLET | Freq: Three times a day (TID) | ORAL | Status: DC | PRN

## 2017-09-19 MED ORDER — RENA-VITE PO TABS
1.0000 | ORAL_TABLET | Freq: Every day | ORAL | Status: DC
  Administered 2017-09-20 (×2): 1 via ORAL
  Filled 2017-09-19 (×3): qty 1

## 2017-09-19 MED ORDER — ALBUTEROL SULFATE (2.5 MG/3ML) 0.083% IN NEBU
2.5000 mg | INHALATION_SOLUTION | RESPIRATORY_TRACT | Status: DC | PRN
Start: 2017-09-19 — End: 2017-09-21

## 2017-09-19 MED ORDER — PRO-STAT SUGAR FREE PO LIQD
30.0000 mL | Freq: Two times a day (BID) | ORAL | Status: DC
  Administered 2017-09-20 – 2017-09-21 (×3): 30 mL via ORAL
  Filled 2017-09-19 (×4): qty 30

## 2017-09-19 MED ORDER — SODIUM CHLORIDE 0.9 % IV SOLN: 100.0000 mL | INTRAVENOUS | Status: DC | PRN

## 2017-09-19 MED ORDER — POLYETHYLENE GLYCOL 3350 17 G PO PACK: 17.0000 g | PACK | Freq: Every day | ORAL | Status: DC | PRN

## 2017-09-19 MED ORDER — METHOCARBAMOL 500 MG PO TABS
500.0000 mg | ORAL_TABLET | Freq: Four times a day (QID) | ORAL | Status: DC | PRN
  Administered 2017-09-20: 500 mg via ORAL
  Filled 2017-09-19: qty 1

## 2017-09-19 MED ORDER — IOPAMIDOL (ISOVUE-370) INJECTION 76%
INTRAVENOUS | Status: AC
  Filled 2017-09-19: qty 100

## 2017-09-19 MED ORDER — FENTANYL CITRATE (PF) 100 MCG/2ML IJ SOLN
50.0000 ug | Freq: Once | INTRAMUSCULAR | Status: AC
  Administered 2017-09-19: 50 ug via INTRAVENOUS
  Filled 2017-09-19: qty 2

## 2017-09-19 MED ORDER — NITROGLYCERIN 0.4 MG SL SUBL: 0.4000 mg | SUBLINGUAL_TABLET | SUBLINGUAL | Status: DC | PRN

## 2017-09-19 MED ORDER — HEPARIN SODIUM (PORCINE) 5000 UNIT/ML IJ SOLN
5000.0000 [IU] | Freq: Three times a day (TID) | INTRAMUSCULAR | Status: DC
  Administered 2017-09-20 (×3): 5000 [IU] via SUBCUTANEOUS
  Filled 2017-09-19 (×2): qty 1

## 2017-09-19 MED ORDER — SODIUM CHLORIDE 0.9 % IV SOLN: 250.0000 mL | INTRAVENOUS | Status: DC | PRN

## 2017-09-19 MED ORDER — PENTAFLUOROPROP-TETRAFLUOROETH EX AERO
1.0000 "application " | INHALATION_SPRAY | CUTANEOUS | Status: DC | PRN
  Filled 2017-09-19: qty 30

## 2017-09-19 MED ORDER — IOPAMIDOL (ISOVUE-370) INJECTION 76%
100.0000 mL | Freq: Once | INTRAVENOUS | Status: AC | PRN
  Administered 2017-09-19: 100 mL via INTRAVENOUS

## 2017-09-19 MED ORDER — ONDANSETRON HCL 4 MG/2ML IJ SOLN: 4.0000 mg | Freq: Three times a day (TID) | INTRAMUSCULAR | Status: DC | PRN

## 2017-09-19 MED ORDER — MORPHINE SULFATE (PF) 4 MG/ML IV SOLN
1.0000 mg | INTRAVENOUS | Status: DC | PRN
  Administered 2017-09-20: 1 mg via INTRAVENOUS

## 2017-09-19 MED ORDER — NEPRO/CARBSTEADY PO LIQD
237.0000 mL | Freq: Two times a day (BID) | ORAL | Status: DC
Start: 2017-09-20 — End: 2017-09-21
  Administered 2017-09-20 (×2): 237 mL via ORAL
  Filled 2017-09-19 (×6): qty 237

## 2017-09-19 MED ORDER — ZOLPIDEM TARTRATE 5 MG PO TABS
5.0000 mg | ORAL_TABLET | Freq: Every evening | ORAL | Status: DC | PRN
  Administered 2017-09-20: 5 mg via ORAL
  Filled 2017-09-19: qty 1

## 2017-09-19 MED ORDER — SODIUM CHLORIDE 0.9% FLUSH
3.0000 mL | INTRAVENOUS | Status: DC | PRN
  Administered 2017-09-20: 3 mL via INTRAVENOUS
  Filled 2017-09-19: qty 3

## 2017-09-19 MED ORDER — PANTOPRAZOLE SODIUM 40 MG PO TBEC
40.0000 mg | DELAYED_RELEASE_TABLET | Freq: Every day | ORAL | Status: DC
  Administered 2017-09-20 – 2017-09-21 (×3): 40 mg via ORAL
  Filled 2017-09-19 (×3): qty 1

## 2017-09-19 MED ORDER — LIDOCAINE-PRILOCAINE 2.5-2.5 % EX CREA
1.0000 "application " | TOPICAL_CREAM | CUTANEOUS | Status: DC | PRN
  Filled 2017-09-19: qty 5

## 2017-09-19 MED ORDER — CALCIUM ACETATE (PHOS BINDER) 667 MG PO CAPS
667.0000 mg | ORAL_CAPSULE | Freq: Three times a day (TID) | ORAL | Status: DC
  Administered 2017-09-20 – 2017-09-21 (×4): 667 mg via ORAL
  Filled 2017-09-19 (×4): qty 1

## 2017-09-19 MED ORDER — ONDANSETRON HCL 4 MG/2ML IJ SOLN
4.0000 mg | Freq: Once | INTRAMUSCULAR | Status: AC
  Administered 2017-09-19: 4 mg via INTRAVENOUS
  Filled 2017-09-19: qty 2

## 2017-09-19 MED ORDER — INSULIN ASPART 100 UNIT/ML ~~LOC~~ SOLN
0.0000 [IU] | Freq: Three times a day (TID) | SUBCUTANEOUS | Status: DC
  Administered 2017-09-20: 3 [IU] via SUBCUTANEOUS
  Administered 2017-09-21: 2 [IU] via SUBCUTANEOUS

## 2017-09-19 MED ORDER — HYDRALAZINE HCL 20 MG/ML IJ SOLN
5.0000 mg | INTRAMUSCULAR | Status: DC | PRN
Start: 2017-09-19 — End: 2017-09-21

## 2017-09-19 MED ORDER — HEPARIN SODIUM (PORCINE) 1000 UNIT/ML DIALYSIS
1400.0000 [IU] | Freq: Once | INTRAMUSCULAR | Status: AC
  Administered 2017-09-19: 1400 [IU] via INTRAVENOUS_CENTRAL
  Filled 2017-09-19: qty 2

## 2017-09-19 NOTE — Consult Note (Signed)
Renal Service Consult Note [facility]  Frances Reeves 09/19/2017 Frances Reeves Requesting Physician:  Frances Reeves  Reason for Consult:  ESRD pt with SOB and fluid overload HPI: The patient is a 67 y.o. year-old with hx of CVA, dm2, esrd on hd and anemia presented to ED with nausea/ vomiting and SOB.  Says she vomited last night and today many times, no abd pain .  In ED work up showed CTA chest negative for PE, CXR showed pulm edema mild/mod and CTA chest showed mod large bilat effusion and some pericardial effusion as well.  We are asked to see for esrd.   Patient denies any fevers or diarrhea, jsut n/v/ as above.  And SOB.       echart:  jan 2019 > acute resp failure w/ missed dialysis, ^K and possible CAP, treated w/ dialysis  fluid removal and IV then po abx at dc  may 11- 17, 2019 > mechanical fall at home left pubic ramus fracture unable to walk seen by ortho no surg  Intervention, dc'd to snf also hd tts dm2   may 28- 29, 2019 > hypoglycemia sent from SNF, had klebsiella and e. Faecalis  UTI and indwelling foley placed at SNF unclear reasons she was treated with abx and foley cath was dc'd, received IV d10 and amaryl was stopped and lantus stopped as well for hypoglycemia.  pt sent back to snf at dc      ROS  no joint pain   no HA  no blurry vision  no rash  no diarrhea  Past Medical History  Past Medical History:  Diagnosis Date  . Anemia   . Chronic kidney disease   . Diabetes mellitus without complication (HCC)   . GERD (gastroesophageal reflux disease)   . Stroke [facility])    in her 20's   Past Surgical History  Past Surgical History:  Procedure Laterality Date  . APPENDECTOMY    . BASCILIC VEIN TRANSPOSITION Left 11/24/2016   Procedure: FIRST STAGE BASCILIC VEIN TRANSPOSITION;  Surgeon: Frances Harman, Reeves;  Location[facility] OR;  Service: Vascular;  Laterality: Left;  . EYE SURGERY Bilateral    cataract surgery with lens implant  .  INSERTION OF DIALYSIS CATHETER Right 11/24/2016   Procedure: INSERTION OF TUNNELED DIALYSIS CATHETER;  Surgeon: Frances Harman, Reeves;  Location: [facility] OR;  Service: Vascular;  Laterality: Right;   Family History  Family History  Problem Relation Age of Onset  . Diabetes Other   . CAD Neg Hx   . Cancer Neg Hx   . Renal Disease Neg Hx    Social History  reports that she has never smoked. She has never used smokeless tobacco. She reports that she does not drink alcohol or use drugs. Allergies No Known Allergies Home medications Prior to Admission medications   Medication Sig Start Date End Date Taking? Authorizing Provider  Amino Acids-Protein Hydrolys (FEEDING SUPPLEMENT, PRO-STAT SUGAR FREE 64,) LIQD Take 30 mLs by mouth 2 (two) times daily. 09/04/17  Yes Frances Shipper, Reeves  calcium acetate (PHOSLO) 667 MG capsule Take 667 mg by mouth 3 (three) times daily with meals. 02/20/17  Yes Provider, Historical, Reeves  multivitamin (RENA-VIT) TABS tablet Take 1 tablet by mouth at bedtime. 09/04/17  Yes Frances Shipper, Reeves  Nutritional Supplements (FEEDING SUPPLEMENT, NEPRO CARB STEADY,) LIQD Take 237 mLs by mouth 2 (two) times daily between meals. 09/04/17  Yes Frances Shipper, Reeves  pantoprazole (PROTONIX) 40 MG tablet Take  1 tablet (40 mg total) by mouth daily. 09/04/17  Yes Frances Shipper, Reeves  polyethylene glycol [facility] / GLYCOLAX) packet Take 17 g by mouth daily as needed for mild constipation. 09/04/17  Yes Frances Shipper, Reeves  acetaminophen (TYLENOL) 650 MG CR tablet Take 650 mg by mouth every 8 (eight) hours as needed for pain.    Provider, Historical, Reeves  fosfomycin (MONUROL) 3 g PACK Take 3 g by mouth once for 1 dose. 09/19/17 09/19/17  Frances Reeves  insulin aspart (NOVOLOG FLEXPEN) 100 UNIT/ML FlexPen Inject 0-9 Units into the skin 3 (three) times daily with meals. CBG < 70: implement hypoglycemia protocol CBG 70 - 120: 0 units CBG 121 - 150: 1 unit CBG 151 - 200: 2 units CBG 201 - 250:  3 units CBG 251 - 300: 5 units CBG 301 - 350: 7 units CBG 351 - 400: 9 units CBG > 400: call Reeves. 09/16/17   Frances Etienne, Reeves  methocarbamol (ROBAXIN) 500 MG tablet Take 1 tablet (500 mg total) by mouth every 6 (six) hours as needed for muscle spasms. 09/04/17   Frances Shipper, Reeves   Liver Function Tests Recent Labs  Lab 09/15/17 0215 09/19/17 1340  AST 23 21  ALT 14 13*  ALKPHOS 133* 135*  BILITOT 0.4 0.6  PROT 6.9 6.6  ALBUMIN 2.9* 2.9*   No results for input(s): LIPASE, AMYLASE in the last 168 hours. CBC Recent Labs  Lab 09/15/17 0215 09/16/17 0411 09/19/17 1340  WBC 9.5 5.8 5.4  NEUTROABS 8.1*  --  3.8  HGB 12.0 10.3* 10.8*  HCT 37.3 32.2* 33.4*  MCV 92.1 92.5 91.3  PLT 400 336 328   Basic Metabolic Panel Recent Labs  Lab 09/15/17 0215 09/16/17 0411 09/19/17 1340  NA 128* 135 131*  K 5.0 3.9 4.4  CL 87* 95* 90*  CO2 29 28 27   GLUCOSE 250* 122* 152*  BUN 62* 23* 40*  CREATININE 4.98* 2.51* 3.98*  CALCIUM 8.5* 7.9* 8.5*   Iron/TIBC/Ferritin/ %Sat No results found for: IRON, TIBC, FERRITIN, IRONPCTSAT  Vitals:   09/19/17 1545 09/19/17 1600 09/19/17 1615 09/19/17 1630  BP: (!) 145/61 (!) 144/61 (!) 143/61 (!) 144/61  Pulse: 85 81 80 81  Resp: 11 12 14 17   SpO2: 93% 97% 97% 99%   Exam Gen elderly frail hispanic female not in distress No rash, cyanosis or gangrene Sclera anicteric, throat clear  No jvd or bruits Chest rales right base left clear no wheezing RRR no MRG Abd soft ntnd no mass or ascites +bs GU on foley in place MS no joint effusions or deformity Ext no leg or UE edema, no wounds or ulcers Neuro is alert, Ox 3 , nf, gen'd weakness   Left upper arm AVF some bruising and +bruit    Dialysis: [facility] tts  4h  2/2.25 bath  Heparin 1400  lua avf  Dry wt - pending   Impression: 1  SOB - pulm edema and large pleural effusions by CXR/ CT, not in distress. May have lost body wt as she has been sick a lot the last few weeks.  Will  need hd tonight to get volume down then prob again on Monday.   2  esrd on hd TTS schedule 3  Pubic ramus fracture - recently , nonsurg mgmnt 4  Debility - worse since #3 5  DM2 - recently here with low BS and lantus/ amaryl were stopped, cont'd on SSI 6  Hx CVA  7  Anemia ckd - hb 10.8 no esa needed   Plan - as above  Vinson Moselleob Shamicka Inga Reeves [facility] pager [phone]   09/19/2017, 7:24 PM

## 2017-09-19 NOTE — ED Triage Notes (Signed)
Pt arrived via [facility], per EMS pt from nsg facility and has dialysis Tues/Thurs/Sat but did not have today due to pain; Pt Spanish speaking only w/ c/o SOB, CP, N/V; pt rec'd 4mg  of Zofran from EMS IV and 324 ASA by mouth but vomited after oral meds given. Pt recently fell with bilateral pelvic fx. Pt states pain is centralized. Pt LA restricted; 20 RAC, 136/58, 86, 20R, 99% on RA, CBG 247,

## 2017-09-19 NOTE — H&P (Signed)
History and Physical    Frances Reeves GMW:[id number] DOB: [birth year] DOA: 09/19/2017  Referring MD/NP/PA:   PCP: Patient, No Pcp Per   Patient coming from:  The patient is coming from SNF.  At baseline, pt is dependent for most of ADL.   Chief Complaint: SOB, chest pressure, nausea, vomiting, dizziness  HPI: Frances Reeves is a 67 y.o. female with medical history significant of diabetes mellitus, stroke, GERD, ESRD-HD (TTS), dCHF, who presents with  SOB, chest pressure, nausea, vomiting, dizziness.  Patient does speak Spanish, history is obtained via Science writer.  Patient was recently hospitalized up from 5/28-5/29 due to unresponsiveness, which was secondary to hypoglycemia. Lantus was discontinued. She was started sliding scale insulin at discharge.  Patient states that she missed dialysis today, and developed the shortness of breath  She has chest pressure, no cough.  No fever or chills.  She has nausea and vomited several times yesterday and today, but no abdominal pain or diarrhea.  She states that she feels dizzy and room spinning around her.  Denies unilateral weakness, numbness or tingling his extremities.  No facial droop or slurred speech.  Denies symptoms of UTI.  ED Course: pt was found to have WBC 5.4, urinalysis with small amount of leukocyte, potassium of 4.4, bicarbonate 27, creatinine is 3.98, BUN 40, temperature normal, no tachycardia, is tachypneic, oxygen saturation 93% on room air. CTA chest negative for PE, CXR showed pulm edema mild/mod and CTA chest showed mod large bilat effusion and some pericardial effusion.  Patient is placed on telemetry bed of observation.  Dr. Arlean Hopping of nephrology was consulted.  Review of Systems:   General: no fevers, chills, has poor appetite, has fatigue HEENT: no blurry vision, hearing changes or sore throat Respiratory: has dyspnea, no coughing, wheezing CV: has chest pressure. no palpitations GI: has nausea, vomiting,  no abdominal pain, diarrhea, constipation GU: no dysuria, burning on urination, increased urinary frequency, hematuria  Ext: no leg edema Neuro: no unilateral weakness, numbness, or tingling, no vision change or hearing loss. Has dizizness. Skin: no rash, no skin tear. MSK: No muscle spasm, no deformity, no limitation of range of movement in spin Heme: No easy bruising.  Travel history: No recent long distant travel.  Allergy: No Known Allergies  Past Medical History:  Diagnosis Date  . Anemia   . Chronic kidney disease   . Diabetes mellitus without complication (HCC)   . GERD (gastroesophageal reflux disease)   . Stroke [facility])    in her 70's    Past Surgical History:  Procedure Laterality Date  . APPENDECTOMY    . BASCILIC VEIN TRANSPOSITION Left 11/24/2016   Procedure: FIRST STAGE BASCILIC VEIN TRANSPOSITION;  Surgeon: Maeola Harman, MD;  Location: [facility] OR;  Service: Vascular;  Laterality: Left;  . EYE SURGERY Bilateral    cataract surgery with lens implant  . INSERTION OF DIALYSIS CATHETER Right 11/24/2016   Procedure: INSERTION OF TUNNELED DIALYSIS CATHETER;  Surgeon: Maeola Harman, MD;  Location: [facility] OR;  Service: Vascular;  Laterality: Right;    Social History:  reports that she has never smoked. She has never used smokeless tobacco. She reports that she does not drink alcohol or use drugs.  Family History:  Family History  Problem Relation Age of Onset  . Diabetes Other   . CAD Neg Hx   . Cancer Neg Hx   . Renal Disease Neg Hx      Prior to Admission medications   Medication  Sig Start Date End Date Taking? Authorizing Provider  Amino Acids-Protein Hydrolys (FEEDING SUPPLEMENT, PRO-STAT SUGAR FREE 64,) LIQD Take 30 mLs by mouth 2 (two) times daily. 09/04/17  Yes Osvaldo ShipperKrishnan, Gokul, MD  calcium acetate (PHOSLO) 667 MG capsule Take 667 mg by mouth 3 (three) times daily with meals. 02/20/17  Yes [provider]  multivitamin (RENA-VIT) TABS tablet  Take 1 tablet by mouth at bedtime. 09/04/17  Yes Osvaldo ShipperKrishnan, Gokul, MD  Nutritional Supplements (FEEDING SUPPLEMENT, NEPRO CARB STEADY,) LIQD Take 237 mLs by mouth 2 (two) times daily between meals. 09/04/17  Yes Osvaldo ShipperKrishnan, Gokul, MD  pantoprazole (PROTONIX) 40 MG tablet Take 1 tablet (40 mg total) by mouth daily. 09/04/17  Yes Osvaldo ShipperKrishnan, Gokul, MD  polyethylene glycol [facility](MIRALAX / GLYCOLAX) packet Take 17 g by mouth daily as needed for mild constipation. 09/04/17  Yes Osvaldo ShipperKrishnan, Gokul, MD  acetaminophen (TYLENOL) 650 MG CR tablet Take 650 mg by mouth every 8 (eight) hours as needed for pain.    [provider]  fosfomycin (MONUROL) 3 g PACK Take 3 g by mouth once for 1 dose. 09/19/17 09/19/17  Hongalgi, Maximino GreenlandAnand D, MD  insulin aspart (NOVOLOG FLEXPEN) 100 UNIT/ML FlexPen Inject 0-9 Units into the skin 3 (three) times daily with meals. CBG < 70: implement hypoglycemia protocol CBG 70 - 120: 0 units CBG 121 - 150: 1 unit CBG 151 - 200: 2 units CBG 201 - 250: 3 units CBG 251 - 300: 5 units CBG 301 - 350: 7 units CBG 351 - 400: 9 units CBG > 400: call MD. 09/16/17   Elease EtienneHongalgi, Anand D, MD  methocarbamol (ROBAXIN) 500 MG tablet Take 1 tablet (500 mg total) by mouth every 6 (six) hours as needed for muscle spasms. 09/04/17   Osvaldo ShipperKrishnan, Gokul, MD    Physical Exam: Vitals:   09/19/17 1915 09/19/17 1930 09/19/17 1945 09/19/17 2000  BP: (!) 154/63 (!) 158/61 (!) 155/63 (!) 149/67  Pulse: 86 85 85 82  Resp: 14 16 15 11   SpO2: 98% 97% 96% 95%   General: Not in acute distress HEENT:       Eyes: PERRL, EOMI, no scleral icterus.       ENT: No discharge from the ears and nose, no pharynx injection, no tonsillar enlargement.        Neck: No JVD, no bruit, no mass felt. Heme: No neck lymph node enlargement. Cardiac: S1/S2, RRR, No murmurs, No gallops or rubs. Respiratory:  Has crackles on the R base. no wheezing, rhonchi or rubs. GI: Soft, nondistended, nontender, no rebound pain, no organomegaly, BS  present. GU: No hematuria Ext: No pitting leg edema bilaterally. 2+DP/PT pulse bilaterally. Musculoskeletal: No joint deformities, No joint redness or warmth, no limitation of ROM in spin. Skin: No rashes.  Neuro: Alert, oriented X3, cranial nerves II-XII grossly intact, moves all extremities normally. Muscle strength 5/5 in all extremities, sensation to light touch intact. Brachial reflex 2+ bilaterally. Negative Babinski's sign. Psych: Patient is not psychotic, no suicidal or hemocidal ideation.  Labs on Admission: I have personally reviewed following labs and imaging studies  CBC: Recent Labs  Lab 09/15/17 0215 09/16/17 0411 09/19/17 1340  WBC 9.5 5.8 5.4  NEUTROABS 8.1*  --  3.8  HGB 12.0 10.3* 10.8*  HCT 37.3 32.2* 33.4*  MCV 92.1 92.5 91.3  PLT 400 336 328   Basic Metabolic Panel: Recent Labs  Lab 09/15/17 0215 09/16/17 0411 09/19/17 1340  NA 128* 135 131*  K 5.0 3.9 4.4  CL  87* 95* 90*  CO2 29 28 27   GLUCOSE 250* 122* 152*  BUN 62* 23* 40*  CREATININE 4.98* 2.51* 3.98*  CALCIUM 8.5* 7.9* 8.5*   GFR: Estimated Creatinine Clearance: 8.6 mL/min (A) (by C-G formula based on SCr of 3.98 mg/dL (H)). Liver Function Tests: Recent Labs  Lab 09/15/17 0215 09/19/17 1340  AST 23 21  ALT 14 13*  ALKPHOS 133* 135*  BILITOT 0.4 0.6  PROT 6.9 6.6  ALBUMIN 2.9* 2.9*   No results for input(s): LIPASE, AMYLASE in the last 168 hours. No results for input(s): AMMONIA in the last 168 hours. Coagulation Profile: No results for input(s): INR, PROTIME in the last 168 hours. Cardiac Enzymes: No results for input(s): CKTOTAL, CKMB, CKMBINDEX, TROPONINI in the last 168 hours. BNP (last 3 results) No results for input(s): PROBNP in the last 8760 hours. HbA1C: No results for input(s): HGBA1C in the last 72 hours. CBG: Recent Labs  Lab 09/16/17 0112 09/16/17 0547 09/16/17 0726 09/16/17 1152 09/19/17 1338  GLUCAP 134* 73 78 159* 159*   Lipid Profile: No results for  input(s): CHOL, HDL, LDLCALC, TRIG, CHOLHDL, LDLDIRECT in the last 72 hours. Thyroid Function Tests: No results for input(s): TSH, T4TOTAL, FREET4, T3FREE, THYROIDAB in the last 72 hours. Anemia Panel: No results for input(s): VITAMINB12, FOLATE, FERRITIN, TIBC, IRON, RETICCTPCT in the last 72 hours. Urine analysis:    Component Value Date/Time   COLORURINE YELLOW 09/19/2017 1458   APPEARANCEUR HAZY (A) 09/19/2017 1458   LABSPEC 1.014 09/19/2017 1458   PHURINE 8.0 09/19/2017 1458   GLUCOSEU >=500 (A) 09/19/2017 1458   HGBUR SMALL (A) 09/19/2017 1458   BILIRUBINUR NEGATIVE 09/19/2017 1458   KETONESUR NEGATIVE 09/19/2017 1458   PROTEINUR >=300 (A) 09/19/2017 1458   UROBILINOGEN 0.2 02/20/2015 2223   NITRITE NEGATIVE 09/19/2017 1458   LEUKOCYTESUR SMALL (A) 09/19/2017 1458   Sepsis Labs: @LABRCNTIP (procalcitonin:4,lacticidven:4) ) Recent Results (from the past 240 hour(s))  Urine Culture     Status: Abnormal   Collection Time: 09/15/17  3:10 AM  Result Value Ref Range Status   Specimen Description URINE, CATHETERIZED  Final   Special Requests   Final    NONE Performed at [facility], [address], [place], [place] [address]    Culture (A)  Final    >=100,000 COLONIES/mL KLEBSIELLA PNEUMONIAE >=100,000 COLONIES/mL ENTEROCOCCUS FAECALIS    Report Status 09/17/2017 FINAL  Final   Organism ID, Bacteria KLEBSIELLA PNEUMONIAE (A)  Final   Organism ID, Bacteria ENTEROCOCCUS FAECALIS (A)  Final      Susceptibility   Enterococcus faecalis - MIC*    AMPICILLIN <=2 SENSITIVE Sensitive     LEVOFLOXACIN 2 SENSITIVE Sensitive     NITROFURANTOIN <=16 SENSITIVE Sensitive     VANCOMYCIN 2 SENSITIVE Sensitive     * >=100,000 COLONIES/mL ENTEROCOCCUS FAECALIS   Klebsiella pneumoniae - MIC*    AMPICILLIN RESISTANT Resistant     CEFAZOLIN <=4 SENSITIVE Sensitive     CEFTRIAXONE <=1 SENSITIVE Sensitive     CIPROFLOXACIN <=0.25 SENSITIVE Sensitive     GENTAMICIN <=1 SENSITIVE  Sensitive     IMIPENEM <=0.25 SENSITIVE Sensitive     NITROFURANTOIN 128 RESISTANT Resistant     TRIMETH/SULFA <=20 SENSITIVE Sensitive     AMPICILLIN/SULBACTAM <=2 SENSITIVE Sensitive     PIP/TAZO <=4 SENSITIVE Sensitive     Extended ESBL NEGATIVE Sensitive     * >=100,000 COLONIES/mL KLEBSIELLA PNEUMONIAE  Culture, blood (Routine X 2) w Reflex to ID Panel  Status: None (Preliminary result)   Collection Time: 09/15/17 10:58 AM  Result Value Ref Range Status   Specimen Description BLOOD RIGHT HAND  Final   Special Requests   Final    BOTTLES DRAWN AEROBIC AND ANAEROBIC Blood Culture adequate volume   Culture   Final    NO GROWTH 4 DAYS Performed at [facility], [address], [place], [place] [address]    Report Status PENDING  Incomplete  Culture, blood (Routine X 2) w Reflex to ID Panel     Status: None (Preliminary result)   Collection Time: 09/15/17 11:09 AM  Result Value Ref Range Status   Specimen Description BLOOD RIGHT FOREARM  Final   Special Requests   Final    BOTTLES DRAWN AEROBIC AND ANAEROBIC Blood Culture adequate volume   Culture   Final    NO GROWTH 4 DAYS Performed at [facility], [address], [place], [place] [address]    Report Status PENDING  Incomplete     Radiological Exams on Admission: Ct Angio Chest Pe W And/or Wo Contrast  Result Date: 09/19/2017 CLINICAL DATA:  67 year old with acute onset of shortness of breath and chest heaviness that began yesterday, associated with anxiety, nausea and vomiting. EXAM: CT ANGIOGRAPHY CHEST WITH CONTRAST TECHNIQUE: Multidetector CT imaging of the chest was performed using the standard protocol during bolus administration of intravenous contrast. Multiplanar CT image reconstructions and MIPs were obtained to evaluate the vascular anatomy. CONTRAST:  ISOVUE-370 IOPAMIDOL INJECTION 76% IV. COMPARISON:  No prior chest CT. Multiple prior chest x-rays, including earlier same day. FINDINGS:  Cardiovascular: Contrast opacification of pulmonary arteries is very good. Respiratory motion blurred many of the images. Overall, the study is of moderate to good diagnostic quality. No filling defects within either main pulmonary artery or their segmental branches in either lung to suggest pulmonary embolism. Heart moderately enlarged. Moderate to large pericardial effusion. No visible coronary atherosclerosis. Note is made of reflux of contrast into the intrahepatic IVC and the a patent veins. Moderate atherosclerosis involving the thoracic and proximal abdominal aorta without evidence of aneurysm. Mediastinum/Nodes: No pathologically enlarged mediastinal, hilar or axillary lymph nodes. No mediastinal masses. Normal-appearing esophagus. Normal-appearing thyroid gland. Lungs/Pleura: Ground-glass airspace opacities throughout both lungs, with evidence of interstitial edema. Large BILATERAL pleural effusions, RIGHT greater than LEFT, with associated passive atelectasis in the lower lobes. Fluid in the minor fissure on the RIGHT central airways patent with no significant bronchial wall thickening. Upper Abdomen: Visualized upper abdomen unremarkable for the early arterial phase of enhancement. Anatomic variant in that the LEFT lobe of the liver extends well across the midline into the LEFT UPPER quadrant. Musculoskeletal: Regional skeleton intact without acute or significant osseous abnormality. Review of the MIP images confirms the above findings. IMPRESSION: 1. No evidence of pulmonary embolism. 2. Mild CHF, with mild interstitial pulmonary edema. 3. Large BILATERAL pleural effusions, RIGHT greater than LEFT, with associated mild passive atelectasis in the lower lobes. 4. Moderate to large pericardial effusion. 5. Reflux of contrast into the IVC and hepatic veins likely indicates RIGHT heart failure and/or tricuspid valvular disease. Aortic Atherosclerosis (ICD10-170.0) Electronically Signed   By: Hulan Saas  M.D.   On: 09/19/2017 18:26   Dg Chest Port 1 View  Result Date: 09/19/2017 CLINICAL DATA:  Chest pain EXAM: PORTABLE CHEST 1 VIEW COMPARISON:  09/15/2017 FINDINGS: Cardiac shadow remains enlarged. Aortic calcifications are again seen. The lungs are well aerated bilaterally without focal infiltrate some chronic  changes in the left base are seen. No sizable effusion is noted. Nipple shadows are seen. Degenerative changes in the left shoulder joint are seen. IMPRESSION: Stable changes in the left base.  No acute abnormality noted. Electronically Signed   By: Alcide Clever M.D.   On: 09/19/2017 14:48     EKG: Independently reviewed.  Sinus rhythm, QTC 468, i.e., T wave inversion in V1-V2.   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 CTA negative for PE. Dr. Arlean Hopping was consulted for HD.  -will admit to tele bed for obs -HD by Dr. Bud Face. -prn albuterol nebs  Acute on chronic diastolic CHF (congestive heart failure): 2D echo on 01/20/2017 showed EF of 55 to 60% with grade 1 diastolic dysfunction.  Patient has a fluid overload -Volume management per renal by dialysis.  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 Patient is taking novolog currently -SSI  GERD: -Protonix  History of CVA (cerebrovascular accident): -ASA  Chest pressure: likely due to fluid overload and CHF exacerbation.  Pt has moderate to large amount of pericardial effusion. 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 -HD by Dr. Arlean Hopping  Dizziness: Patient reports dizziness and feeling of room spinning around her.  Etiology is not clear, possibly due to vertigo. Potential differential diagnosis is posterior circulation stroke. No focal neurologic findings on physical examination.   -check orthostatic vital sign - PRN meclizine -PT/OT -If no improvement, may get MRI of brain to rule out stroke.  Nausea and vomiting: No abdominal pain or diarrhea.  Abdominal examination is benign.  Probably due to uremia. -PRN Zofran -Check lipase  Pubic ramus fracture [facility]): pt had closed left pubic ramus fracture recently. Managed nonsurgically.   -Continue rehab at SNF.   DVT ppx: SQ Heparin    Code Status: Full code Family Communication: None at bed side.   Disposition Plan:  Anticipate discharge back to previous SNF Consults called: Dr. Arlean Hopping of nephrology Admission status: Obs / tele     Date of Service 09/19/2017    Lorretta Harp [facility] Pager [phone]  If 7PM-7AM, please contact night-coverage [email or web address] Password [facility] 09/19/2017, 9:45 PM

## 2017-09-19 NOTE — ED Provider Notes (Addendum)
[facility] EMERGENCY DEPARTMENT Provider Note   CSN: [id number] Arrival date & time: 09/19/17  1320     History   Chief Complaint Chief Complaint  Patient presents with  . Chest Pain  . Shortness of Breath    HPI Frances Reeves is a 67 y.o. female.  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  Patient states this started suddenly and was not associated with exertion.  She has no abdominal pain, diarrhea or lower leg pain.  She denies fever.  She last dialyzed on Thursday and states she did not go today because she did not feel well enough.  Of note patient was recently discharged from the hospital 3 days ago after being diagnosed with a urinary tract infection with an indwelling Foley catheter that grew out Klebsiella and enterococcus.  Patient received 2 doses of IV Rocephin while hospitalized and was given fosfomycin.  Patient's cultures did grow out and the Klebsiella is resistant to ampicillin and nitrofurantoin and enterococcus is pansensitive.  Patient states she has no known heart history.  She does not currently take anticoagulation.  The history is provided by the patient. The history is limited by a language barrier. A language interpreter was used.    Past Medical History:  Diagnosis Date  . Anemia   . Chronic kidney disease   . Diabetes mellitus without complication (HCC)   . GERD (gastroesophageal reflux disease)   . Stroke [facility])    in her 10's    Patient Active Problem List   Diagnosis Date Noted  . Lower urinary  tract infectious disease 09/15/2017  . Unresponsiveness 09/15/2017  . Hypoglycemia 09/15/2017  . Hyponatremia 09/15/2017  . 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 Constipation due to opioid therapy 09/07/2017  . Malnutrition of moderate degree 09/01/2017  . Pubic ramus fracture, left, closed, initial encounter (HCC) 08/30/2017  . Chronic diastolic CHF (congestive heart failure) (HCC) 08/30/2017  . Pubic ramus fracture (HCC) 08/30/2017  . Noncompliance of patient with renal dialysis (HCC) 05/13/2017  . Uncontrolled type 2 diabetes mellitus with ESRD (end-stage renal disease) (HCC) 02/07/2017  . GERD without esophagitis 02/07/2017  . Anemia 02/07/2017  . History of CVA (cerebrovascular accident) 02/07/2017  . Chest pain 02/07/2017  . Shortness of breath 02/07/2017    Past Surgical History:  Procedure Laterality Date  . APPENDECTOMY    . BASCILIC VEIN TRANSPOSITION Left 11/24/2016   Procedure: FIRST STAGE BASCILIC VEIN TRANSPOSITION;  Surgeon: Maeola Harman, MD;  Location: [facility] OR;  Service: Vascular;  Laterality: Left;  . EYE SURGERY Bilateral    cataract surgery with lens implant  . INSERTION OF DIALYSIS CATHETER Right 11/24/2016   Procedure: INSERTION OF TUNNELED DIALYSIS CATHETER;  Surgeon: Maeola Harman, MD;  Location: [facility] OR;  Service: Vascular;  Laterality: Right;     OB History   None      Home Medications    Prior to Admission medications   Medication Sig Start Date End Date Taking? Authorizing Provider  Amino Acids-Protein Hydrolys (FEEDING SUPPLEMENT, PRO-STAT SUGAR FREE 64,) LIQD Take  30 mLs by mouth 2 (two) times daily. 09/04/17  Yes Osvaldo Shipper, MD  calcium acetate (PHOSLO) 667 MG capsule Take 667 mg by mouth 3 (three) times daily with meals. 02/20/17  Yes [provider]  multivitamin (RENA-VIT) TABS tablet Take 1 tablet by mouth at bedtime. 09/04/17  Yes Osvaldo Shipper, MD  Nutritional  Supplements (FEEDING SUPPLEMENT, NEPRO CARB STEADY,) LIQD Take 237 mLs by mouth 2 (two) times daily between meals. 09/04/17  Yes Osvaldo Shipper, MD  pantoprazole (PROTONIX) 40 MG tablet Take 1 tablet (40 mg total) by mouth daily. 09/04/17  Yes Osvaldo Shipper, MD  polyethylene glycol [facility] / GLYCOLAX) packet Take 17 g by mouth daily as needed for mild constipation. 09/04/17  Yes Osvaldo Shipper, MD  acetaminophen (TYLENOL) 650 MG CR tablet Take 650 mg by mouth every 8 (eight) hours as needed for pain.    [provider]  fosfomycin (MONUROL) 3 g PACK Take 3 g by mouth once for 1 dose. 09/19/17 09/19/17  Hongalgi, Maximino Greenland, MD  insulin aspart (NOVOLOG FLEXPEN) 100 UNIT/ML FlexPen Inject 0-9 Units into the skin 3 (three) times daily with meals. CBG < 70: implement hypoglycemia protocol CBG 70 - 120: 0 units CBG 121 - 150: 1 unit CBG 151 - 200: 2 units CBG 201 - 250: 3 units CBG 251 - 300: 5 units CBG 301 - 350: 7 units CBG 351 - 400: 9 units CBG > 400: call MD. 09/16/17   Elease Etienne, MD  methocarbamol (ROBAXIN) 500 MG tablet Take 1 tablet (500 mg total) by mouth every 6 (six) hours as needed for muscle spasms. 09/04/17   Osvaldo Shipper, MD    Family History Family History  Problem Relation Age of Onset  . Diabetes Other   . CAD Neg Hx   . Cancer Neg Hx   . Renal Disease Neg Hx     Social History Social History   Tobacco Use  . Smoking status: Never Smoker  . Smokeless tobacco: Never Used  Substance Use Topics  . Alcohol use: No  . Drug use: No     Allergies   Patient has no known allergies.   Review of Systems Review of Systems  All other systems reviewed and are negative.    Physical Exam Updated Vital Signs BP (!) 143/62   Pulse 82   Resp (!) 24   SpO2 100%   Physical Exam  Constitutional: She is oriented to person, place, and time. She appears well-developed and well-nourished.  Appears uncomfortable and slightly short of breath  HENT:  Head:  Normocephalic and atraumatic.  Mouth/Throat: Oropharynx is clear and moist. Mucous membranes are dry.  Eyes: Pupils are equal, round, and reactive to light. Conjunctivae and EOM are normal.  Neck: Normal range of motion. Neck supple.  Cardiovascular: Normal rate, regular rhythm and intact distal pulses.  No murmur heard. Pulmonary/Chest: Effort normal. Tachypnea noted. No respiratory distress. She has no wheezes. She has rales in the right lower field and the left lower field. She exhibits no tenderness.  Abdominal: Soft. She exhibits no distension. There is no tenderness. There is no rebound and no guarding.  Musculoskeletal: Normal range of motion. She exhibits no edema or tenderness.  Healing ecchymosis in arms and legs.  No lower ext edema  Neurological: She is alert and oriented to person, place, and time.  Skin: Skin is warm and dry. Capillary refill takes 2 to 3 seconds. No rash noted. No erythema.  Psychiatric: She has  a normal mood and affect. Her behavior is normal.  Nursing note and vitals reviewed.    ED Treatments / Results  Labs (all labs ordered are listed, but only abnormal results are displayed) Labs Reviewed  CBC WITH DIFFERENTIAL/PLATELET - Abnormal; Notable for the following components:      Result Value   RBC 3.66 (*)    Hemoglobin 10.8 (*)    HCT 33.4 (*)    All other components within normal limits  COMPREHENSIVE METABOLIC PANEL - Abnormal; Notable for the following components:   Sodium 131 (*)    Chloride 90 (*)    Glucose, Bld 152 (*)    BUN 40 (*)    Creatinine, Ser 3.98 (*)    Calcium 8.5 (*)    Albumin 2.9 (*)    ALT 13 (*)    Alkaline Phosphatase 135 (*)    GFR calc non Af Amer 11 (*)    GFR calc Af Amer 12 (*)    All other components within normal limits  URINALYSIS, ROUTINE W REFLEX MICROSCOPIC - Abnormal; Notable for the following components:   APPearance HAZY (*)    Glucose, UA >=500 (*)    Hgb urine dipstick SMALL (*)    Protein, ur >=300  (*)    Leukocytes, UA SMALL (*)    Bacteria, UA RARE (*)    All other components within normal limits  CBG MONITORING, ED - Abnormal; Notable for the following components:   Glucose-Capillary 159 (*)    All other components within normal limits  I-STAT CG4 LACTIC ACID, ED - Abnormal; Notable for the following components:   Lactic Acid, Venous 2.37 (*)    All other components within normal limits  URINE CULTURE  I-STAT TROPONIN, ED  I-STAT TROPONIN, ED    EKG EKG Interpretation  Date/Time:  Saturday September 19 2017 13:26:42 EDT Ventricular Rate:  85 PR Interval:    QRS Duration: 89 QT Interval:  393 QTC Calculation: 468 R Axis:   53 Text Interpretation:  Sinus rhythm Probable left atrial enlargement Probable left ventricular hypertrophy Nonspecific T abnrm, anterolateral leads No significant change since last tracing Confirmed by Gwyneth SproutPlunkett, Tobias Avitabile ([id number]) on 09/19/2017 2:21:16 PM   Radiology Ct Angio Chest Pe W And/or Wo Contrast  Result Date: 09/19/2017 CLINICAL DATA:  67 year old with acute onset of shortness of breath and chest heaviness that began yesterday, associated with anxiety, nausea and vomiting. EXAM: CT ANGIOGRAPHY CHEST WITH CONTRAST TECHNIQUE: Multidetector CT imaging of the chest was performed using the standard protocol during bolus administration of intravenous contrast. Multiplanar CT image reconstructions and MIPs were obtained to evaluate the vascular anatomy. CONTRAST:  100mL ISOVUE-370 IOPAMIDOL INJECTION 76% IV. COMPARISON:  No prior chest CT. Multiple prior chest x-rays, including earlier same day. FINDINGS: Cardiovascular: Contrast opacification of pulmonary arteries is very good. Respiratory motion blurred many of the images. Overall, the study is of moderate to good diagnostic quality. No filling defects within either main pulmonary artery or their segmental branches in either lung to suggest pulmonary embolism. Heart moderately enlarged. Moderate to large  pericardial effusion. No visible coronary atherosclerosis. Note is made of reflux of contrast into the intrahepatic IVC and the a patent veins. Moderate atherosclerosis involving the thoracic and proximal abdominal aorta without evidence of aneurysm. Mediastinum/Nodes: No pathologically enlarged mediastinal, hilar or axillary lymph nodes. No mediastinal masses. Normal-appearing esophagus. Normal-appearing thyroid gland. Lungs/Pleura: Ground-glass airspace opacities throughout both lungs, with evidence of interstitial edema. Large BILATERAL pleural effusions, RIGHT greater than  LEFT, with associated passive atelectasis in the lower lobes. Fluid in the minor fissure on the RIGHT central airways patent with no significant bronchial wall thickening. Upper Abdomen: Visualized upper abdomen unremarkable for the early arterial phase of enhancement. Anatomic variant in that the LEFT lobe of the liver extends well across the midline into the LEFT UPPER quadrant. Musculoskeletal: Regional skeleton intact without acute or significant osseous abnormality. Review of the MIP images confirms the above findings. IMPRESSION: 1. No evidence of pulmonary embolism. 2. Mild CHF, with mild interstitial pulmonary edema. 3. Large BILATERAL pleural effusions, RIGHT greater than LEFT, with associated mild passive atelectasis in the lower lobes. 4. Moderate to large pericardial effusion. 5. Reflux of contrast into the IVC and hepatic veins likely indicates RIGHT heart failure and/or tricuspid valvular disease. Aortic Atherosclerosis (ICD10-170.0) Electronically Signed   By: Hulan Saas M.D.   On: 09/19/2017 18:26   Dg Chest Port 1 View  Result Date: 09/19/2017 CLINICAL DATA:  Chest pain EXAM: PORTABLE CHEST 1 VIEW COMPARISON:  09/15/2017 FINDINGS: Cardiac shadow remains enlarged. Aortic calcifications are again seen. The lungs are well aerated bilaterally without focal infiltrate some chronic changes in the left base are seen. No  sizable effusion is noted. Nipple shadows are seen. Degenerative changes in the left shoulder joint are seen. IMPRESSION: Stable changes in the left base.  No acute abnormality noted. Electronically Signed   By: Alcide Clever M.D.   On: 09/19/2017 14:48    Procedures Procedures (including critical care time)  Medications Ordered in ED Medications  fentaNYL (SUBLIMAZE) injection 50 mcg (has no administration in time range)  ondansetron (ZOFRAN) injection 4 mg (4 mg Intravenous Given 09/19/17 1459)     Initial Impression / Assessment and Plan / ED Course  I have reviewed the triage vital signs and the nursing notes.  Pertinent labs & imaging results that were available during my care of the patient were reviewed by me and considered in my medical decision making (see chart for details).     67 year old female with multiple medical problems presenting today with chest tightness and vomiting.  Patient states the chest tightness started before the vomiting and she has had multiple episodes of emesis throughout the night.  She is also complaining of being short of breath.  She describes the pain as a tightness but it does not radiate.  Of note patient was recently hospitalized for a urinary tract infection and took fosfomycin for this.  Her blood cultures to date have no growth and urine cultures grew out Klebsiella and enterococcus which were pansensitive except for the Klebsiella being resistant to nitrofurantoin and ampicillin.  Patient denies any abdominal pain, fever or new productive cough.  Last dialysis was on Thursday she did not go today because of the way she felt.  She does not appear excessively fluid overloaded.  Also of note patient recently had a pelvis fracture at the beginning of this month and is currently at a skilled nursing facility.  Patient's chest pain could be related to ACS as she has multiple risk factors versus PE versus GI in origin.  Patient's EKG is unchanged today, UA  without significant findings, CMP is at her baseline without evidence of DKA.  Chest x-ray without signs of fluid overload.  Initial troponin is 0.06.  Lactic acid is mildly elevated at 2.37, ABC with a stable hemoglobin.  We will do a CTA of the chest to rule out PE.  We will do delta troponins.  Lower  suspicion for infection at this point.  Patient given Zofran and fentanyl for pain control.  6:58 PM Patient CT was negative for PE however showed CHF with pulmonary edema, large bilateral pleural effusions and a moderate to large pericardial effusion.  Feel that that could be the cause of patient's shortness of breath and chest discomfort.  Will discuss with nephrology for dialysis and the hospitalist for admission and rule out to ensure patient's troponins did not worsen and her chest pain improves after dialysis.  CRITICAL CARE Performed by: Delicia Berens Total critical care time: 30 minutes Critical care time was exclusive of separately billable procedures and treating other patients. Critical care was necessary to treat or prevent imminent or life-threatening deterioration. Critical care was time spent personally by me on the following activities: development of treatment plan with patient and/or surrogate as well as nursing, discussions with consultants, evaluation of patient's response to treatment, examination of patient, obtaining history from patient or surrogate, ordering and performing treatments and interventions, ordering and review of laboratory studies, ordering and review of radiographic studies, pulse oximetry and re-evaluation of patient's condition.   Final Clinical Impressions(s) / ED Diagnoses   Final diagnoses:  Pleural effusion, bilateral  Congestive heart failure, unspecified HF chronicity, unspecified heart failure type [facility])  Chest pain, unspecified type    ED Discharge Orders    None       Gwyneth Sprout, MD 09/19/17 1900    Gwyneth Sprout,  MD 10/05/17 2326

## 2017-09-19 NOTE — ED Notes (Signed)
Patient transported to CT 

## 2017-09-19 NOTE — Progress Notes (Signed)
Called for report.  RN will call floor back.  Bernie CoveyKimberly Carmelita Amparo RN-BC, [employer]

## 2017-09-19 NOTE — ED Notes (Signed)
Attempted to call report to floor 

## 2017-09-20 ENCOUNTER — Observation Stay (HOSPITAL_BASED_OUTPATIENT_CLINIC_OR_DEPARTMENT_OTHER): Payer: Medicare Other

## 2017-09-20 ENCOUNTER — Other Ambulatory Visit: Payer: Self-pay

## 2017-09-20 DIAGNOSIS — I361 Nonrheumatic tricuspid (valve) insufficiency: Secondary | ICD-10-CM | POA: Diagnosis not present

## 2017-09-20 DIAGNOSIS — I5033 Acute on chronic diastolic (congestive) heart failure: Secondary | ICD-10-CM | POA: Diagnosis not present

## 2017-09-20 DIAGNOSIS — R0789 Other chest pain: Secondary | ICD-10-CM | POA: Diagnosis not present

## 2017-09-20 DIAGNOSIS — R42 Dizziness and giddiness: Secondary | ICD-10-CM

## 2017-09-20 DIAGNOSIS — N186 End stage renal disease: Secondary | ICD-10-CM

## 2017-09-20 DIAGNOSIS — R0600 Dyspnea, unspecified: Secondary | ICD-10-CM | POA: Diagnosis not present

## 2017-09-20 DIAGNOSIS — Z992 Dependence on renal dialysis: Secondary | ICD-10-CM | POA: Diagnosis not present

## 2017-09-20 DIAGNOSIS — I313 Pericardial effusion (noninflammatory): Secondary | ICD-10-CM

## 2017-09-20 DIAGNOSIS — I351 Nonrheumatic aortic (valve) insufficiency: Secondary | ICD-10-CM | POA: Diagnosis not present

## 2017-09-20 DIAGNOSIS — E1122 Type 2 diabetes mellitus with diabetic chronic kidney disease: Secondary | ICD-10-CM

## 2017-09-20 DIAGNOSIS — I340 Nonrheumatic mitral (valve) insufficiency: Secondary | ICD-10-CM | POA: Diagnosis not present

## 2017-09-20 DIAGNOSIS — I132 Hypertensive heart and chronic kidney disease with heart failure and with stage 5 chronic kidney disease, or end stage renal disease: Secondary | ICD-10-CM | POA: Diagnosis not present

## 2017-09-20 LAB — LIPID PANEL
CHOL/HDL RATIO: 2.7 ratio
Cholesterol: 205 mg/dL — ABNORMAL HIGH (ref 0–200)
HDL: 77 mg/dL (ref >40)
LDL CALC: 105 mg/dL — AB (ref 0–99)
TRIGLYCERIDES: 116 mg/dL (ref <150)
VLDL: 23 mg/dL (ref 0–40)

## 2017-09-20 LAB — BASIC METABOLIC PANEL
Anion gap: 12 (ref 5–15)
BUN: 17 mg/dL (ref 6–20)
CALCIUM: 8.1 mg/dL — AB (ref 8.9–10.3)
CO2: 31 mmol/L (ref 22–32)
CREATININE: 2.63 mg/dL — AB (ref 0.44–1.00)
Chloride: 89 mmol/L — ABNORMAL LOW (ref 101–111)
GFR calc Af Amer: 20 mL/min — ABNORMAL LOW (ref >60)
GFR, EST NON AFRICAN AMERICAN: 17 mL/min — AB (ref >60)
GLUCOSE: 92 mg/dL (ref 65–99)
Potassium: 3.6 mmol/L (ref 3.5–5.1)
Sodium: 132 mmol/L — ABNORMAL LOW (ref 135–145)

## 2017-09-20 LAB — TROPONIN I
Troponin I: 0.06 ng/mL (ref <0.03)
Troponin I: 0.06 ng/mL (ref <0.03)

## 2017-09-20 LAB — CULTURE, BLOOD (ROUTINE X 2)
Culture: NO GROWTH
Culture: NO GROWTH
SPECIAL REQUESTS: ADEQUATE
Special Requests: ADEQUATE

## 2017-09-20 LAB — GLUCOSE, CAPILLARY
GLUCOSE-CAPILLARY: 96 mg/dL (ref 65–99)
GLUCOSE-CAPILLARY: 204 mg/dL — AB (ref 65–99)
GLUCOSE-CAPILLARY: 149 mg/dL — AB (ref 65–99)
Glucose-Capillary: 166 mg/dL — ABNORMAL HIGH (ref 65–99)
Glucose-Capillary: 78 mg/dL (ref 65–99)

## 2017-09-20 LAB — LIPASE, BLOOD: Lipase: 39 U/L (ref 11–51)

## 2017-09-20 LAB — ECHOCARDIOGRAM COMPLETE: HEIGHTINCHES: 58 in

## 2017-09-20 MED ORDER — DARBEPOETIN ALFA 40 MCG/0.4ML IJ SOSY: 40.0000 ug | PREFILLED_SYRINGE | INTRAMUSCULAR | Status: DC

## 2017-09-20 MED ORDER — DOXERCALCIFEROL 4 MCG/2ML IV SOLN: 3.0000 ug | INTRAVENOUS | Status: DC

## 2017-09-20 MED ORDER — CHLORHEXIDINE GLUCONATE CLOTH 2 % EX PADS
6.0000 | MEDICATED_PAD | Freq: Every day | CUTANEOUS | Status: DC
  Administered 2017-09-20: 6 via TOPICAL

## 2017-09-20 MED ORDER — SODIUM CHLORIDE 0.9 % IV SOLN: 62.5000 mg | INTRAVENOUS | Status: DC

## 2017-09-20 MED ORDER — MORPHINE SULFATE (PF) 4 MG/ML IV SOLN
INTRAVENOUS | Status: AC
  Administered 2017-09-20: 1 mg via INTRAVENOUS
  Filled 2017-09-20: qty 1

## 2017-09-20 NOTE — Progress Notes (Signed)
2D Echocardiogram has been performed.  Pieter PartridgeBrooke S Axiel Fjeld 09/20/2017, 2:58 PM

## 2017-09-20 NOTE — Progress Notes (Signed)
HD tx initiated via 15Gx2 w/o problem, pull/push/flush well w/o problem, VSS, will cont to monitor while on HD tx, interpreter service used for education and tx review

## 2017-09-20 NOTE — Significant Event (Signed)
Rapid Response Event Note  Overview: Time Called: 0014 Arrival Time: 0016 Event Type: Cardiac  Initial Focused Assessment: Tori RN called for patient c/o chest pain while on HD.  Ms. Frances Reeves was brought from the ED to HD and had 2.5 hours of her HD before being terminated early for CP.  Upon my arrival, Ms. Frances Reeves is lying in the stretcher, anxious, moving her head side to side.  She is alert and following simple commands.  She pointed to the left side of her chest and said "mucho" when I asked if she was in pain.  She described her pain as "pressure".  Skin is warm, pink and cap refil is less than 3 secs. Once her HD treatment was ended, HR 78, BP 119/53, RR 22, Sats 100% on 2L [place].  Pt is currently being admitted for SOB from fluid overload.  Initial troponin are negative. I contacted C. [facility]oden[facility] APP of events and orders received.   Ekg obtained and NSR with no acute changes.  Morphine 1mg  IV given.   After receiving morphine, Ms. Frances Reeves denies chest tightness and SOB.  Next troponin is due in about an hour and will watch for results.   Interventions: -EKG (shows no acute changes) -Morphine (previously ordered) -check previously ordered troponins -titrate o2 if needed for sats >92%    Event Summary: [facility] 0110.       Rose Fillersees, Oleva Koo W

## 2017-09-20 NOTE — Evaluation (Signed)
Occupational Therapy Evaluation Patient Details Name: Frances Reeves MRN: [id number] DOB: 09/16/1950 Today's Date: 09/20/2017    History of Present Illness Frances Reeves is a 67 y.o. female with medical history significant of diabetes mellitus, stroke, GERD, ESRD-HD (TTS), dCHF, who presents with  SOB, chest pressure, nausea, vomiting, dizziness. rEcent admission after a fall resulting in L sup and inf pubic rami fxs, was WBAT   Clinical Impression   Pt admitted with the above, and demonstrates the below listed deficits.  She currently is able to perform ADLs at min guard - min A, and min guard assist for functional mobility.  [facility] and is making excellent progress.  She lives alone and will need to be at mod I level to return home safely.  Recommend return to SNF to complete rehab.  All further OT needs can be addressed by SNF.  If pt has a prolonged hospital stay, will then establish goals and treatment plan.     Follow Up Recommendations  SNF    Equipment Recommendations  None recommended by OT    Recommendations for Other Services       Precautions / Restrictions Precautions Precautions: Fall Restrictions RLE Weight Bearing: Weight bearing as tolerated LLE Weight Bearing: Weight bearing as tolerated      Mobility Bed Mobility Overal bed mobility: Needs Assistance Bed Mobility: Supine to Sit     Supine to sit: Min guard     General bed mobility comments: Incr time; close guard for safety, but did not need physical assist  Transfers Overall transfer level: Needs assistance Equipment used: Rolling walker (2 wheeled)(youth-sized) Transfers: Sit to/from Stand Sit to Stand: Min guard         General transfer comment: Moves slowly; Cues for hand placement and safety; Good rise    Balance Overall balance assessment: Needs assistance   Sitting balance-Leahy Scale: Good     Standing balance support: Bilateral upper extremity  supported Standing balance-Leahy Scale: Fair Standing balance comment: reliant on UE support with amb; able to stand at sink for ADL without gross loss of balance                           ADL either performed or assessed with clinical judgement   ADL Overall ADL's : Needs assistance/impaired Eating/Feeding: Set up;Sitting   Grooming: Wash/dry hands;Wash/dry face;Oral care;Brushing hair;Min guard;Standing   Upper Body Bathing: Set up;Supervision/ safety;Sitting   Lower Body Bathing: Minimal assistance;Sit to/from stand   Upper Body Dressing : Set up;Supervision/safety;Sitting   Lower Body Dressing: Minimal assistance;Sit to/from stand   Toilet Transfer: Min guard;Ambulation;Comfort height toilet;Grab bars;RW   Toileting- Clothing Manipulation and Hygiene: Minimal assistance;Sit to/from stand       Functional mobility during ADLs: Hydrographic surveyorMin guard;Rolling walker       Vision         Perception     Praxis      Pertinent Vitals/Pain Pain Assessment: Faces Faces Pain Scale: Hurts little more Pain Location: L hip/groin --  she reported feeling better after walking; also discomfort/itching at vulva Pain Descriptors / Indicators: Discomfort;Sore Pain Intervention(s): Monitored during session;Repositioned     Hand Dominance Right   Extremity/Trunk Assessment Upper Extremity Assessment Upper Extremity Assessment: Overall WFL for tasks assessed   Lower Extremity Assessment Lower Extremity Assessment: Defer to PT evaluation       Communication Communication Communication: No difficulties;Prefers language other than English;Interpreter utilized([employer] interpreter,  Matilde Sprang, [id number])   Cognition Arousal/Alertness: Awake/alert Behavior During Therapy: WFL for tasks assessed/performed Overall Cognitive Status: Within Functional Limits for tasks assessed                                     General Comments  sats >94% on RA    Exercises      Shoulder Instructions      Home Living Family/patient expects to be discharged to:: Skilled nursing facility Living Arrangements: Alone Available Help at Discharge: Family;Available PRN/intermittently Type of Home: Apartment Home Access: Ramped entrance     Home Layout: One level     Bathroom Shower/Tub: Chief Strategy Officer: Standard     Home Equipment: Cane - quad;Cane - single point;Grab bars - tub/shower   Additional Comments: Pt has been at [facility] for rehab since May.  She lives alone       Prior Functioning/Environment Level of Independence: Independent with assistive device(s)        Comments: Cane for mobility on HD days, reports hx of falls in the shower        OT Problem List: Decreased strength;Decreased activity tolerance;Impaired balance (sitting and/or standing);Decreased knowledge of use of DME or AE;Pain      OT Treatment/Interventions:      OT Goals(Current goals can be found in the care plan section) Acute Rehab OT Goals Patient Stated Goal: to regain independence   OT Frequency:     Barriers to D/C: Decreased caregiver support          Co-evaluation PT/OT/SLP Co-Evaluation/Treatment: Yes Reason for Co-Treatment: Other (comment);For patient/therapist safety(and to facilitation communication with interpreter )   OT goals addressed during session: ADL's and self-care      AM-PAC PT "6 Clicks" Daily Activity     Outcome Measure                 End of Session Equipment Utilized During Treatment: Gait belt;Rolling walker Nurse Communication: Mobility status  Activity Tolerance: Patient tolerated treatment well Patient left: in chair;with call bell/phone within reach  OT Visit Diagnosis: Unsteadiness on feet (R26.81);Pain Pain - Right/Left: Left Pain - part of body: Hip                Time: 0454-0981 OT Time Calculation (min): 41 min Charges:  OT General Charges $OT Visit: 1 Visit OT Evaluation $OT Eval Moderate  Complexity: 1 Mod G-Codes:     [employer], OTR/L [phone]   Jeani Hawking M 09/20/2017, 4:01 PM

## 2017-09-20 NOTE — Progress Notes (Signed)
[facility] PROGRESS NOTE  Frances Reeves NWG:[id number] DOB: 09-Sep-1950 DOA: 09/19/2017  PCP: Patient, No Pcp Per  Brief History/Interval Summary: Actually 67-year-old female with a past medical history of diabetes mellitus, stroke, GERD, end-stage renal disease on hemodialysis on Tuesday Thursday Saturday, diastolic CHF presented with shortness of breath chest pain nausea or dizziness.  Patient was thought to be fluid overloaded.  CT scan was negative for PE.  Did show pericardial effusion.  Patient was hospitalized for further management.  She underwent dialysis on 6/1.  Reason for Visit: Fluid overload.  Consultants: Nephrology  Procedures: Thoracic echocardiogram is pending  Antibiotics: None  Subjective/Interval History: Patient states that she is feeling better.  She ate her breakfast.  Denies any symptoms currently.  ROS: No nausea or vomiting  Objective:  Vital Signs  Vitals:   09/20/17 0152 09/20/17 0436 09/20/17 0700 09/20/17 0920  BP: (!) 117/49 (!) 144/57  (!) 133/52  Pulse: 80 86  81  Resp: 16 16  16   Temp: 98 F (36.7 C) 98 F (36.7 C)  98.2 F (36.8 C)  TempSrc: Oral Oral  Oral  SpO2: 100% 100%  100%  Height:   4\' 10"  (1.473 m)     Intake/Output Summary (Last 24 hours) at 09/20/2017 1159 Last data filed at 09/20/2017 0900 Gross per 24 hour  Intake 240 ml  Output 1565 ml  Net -1325 ml   Filed Weights    General appearance: alert, cooperative, appears stated age and no distress Head: Normocephalic, without obvious abnormality, atraumatic Resp: clear to auscultation bilaterally Cardio: regular rate and rhythm, S1, S2 normal, no murmur, click, rub or gallop GI: soft, non-tender; bowel sounds normal; no masses,  no organomegaly Extremities: extremities normal, atraumatic, no cyanosis or edema Neurologic: No focal neurological deficits noted.  No facial asymmetry.  Strength is equal bilateral upper and lower extremities.  Lab  Results:  Data Reviewed: I have personally reviewed following labs and imaging studies  CBC: Recent Labs  Lab 09/15/17 0215 09/16/17 0411 09/19/17 1340  WBC 9.5 5.8 5.4  NEUTROABS 8.1*  --  3.8  HGB 12.0 10.3* 10.8*  HCT 37.3 32.2* 33.4*  MCV 92.1 92.5 91.3  PLT 400 336 328    Basic Metabolic Panel: Recent Labs  Lab 09/15/17 0215 09/16/17 0411 09/19/17 1340 09/20/17 0704  NA 128* 135 131* 132*  K 5.0 3.9 4.4 3.6  CL 87* 95* 90* 89*  CO2 29 28 27 31   GLUCOSE 250* 122* 152* 92  BUN 62* 23* 40* 17  CREATININE 4.98* 2.51* 3.98* 2.63*  CALCIUM 8.5* 7.9* 8.5* 8.1*    GFR: Estimated Creatinine Clearance: 13 mL/min (A) (by C-G formula based on SCr of 2.63 mg/dL (H)).  Liver Function Tests: Recent Labs  Lab 09/15/17 0215 09/19/17 1340  AST 23 21  ALT 14 13*  ALKPHOS 133* 135*  BILITOT 0.4 0.6  PROT 6.9 6.6  ALBUMIN 2.9* 2.9*    Recent Labs  Lab 09/20/17 0458  LIPASE 39   Cardiac Enzymes: Recent Labs  Lab 09/20/17 0458 09/20/17 0704  TROPONINI 0.06* 0.06*   CBG: Recent Labs  Lab 09/16/17 1152 09/19/17 1338 09/20/17 0238 09/20/17 0716 09/20/17 1112  GLUCAP 159* 159* 166* 96 204*    Lipid Profile: Recent Labs    09/20/17 0458  CHOL 205*  HDL 77  LDLCALC 105*  TRIG 116  CHOLHDL 2.7     Radiology Studies: Ct Angio Chest Pe W And/or Wo Contrast  Result Date:  09/19/2017 CLINICAL DATA:  67 year old with acute onset of shortness of breath and chest heaviness that began yesterday, associated with anxiety, nausea and vomiting. EXAM: CT ANGIOGRAPHY CHEST WITH CONTRAST TECHNIQUE: Multidetector CT imaging of the chest was performed using the standard protocol during bolus administration of intravenous contrast. Multiplanar CT image reconstructions and MIPs were obtained to evaluate the vascular anatomy. CONTRAST:  ISOVUE-370 IOPAMIDOL INJECTION 76% IV. COMPARISON:  No prior chest CT. Multiple prior chest x-rays, including earlier same day.  FINDINGS: Cardiovascular: Contrast opacification of pulmonary arteries is very good. Respiratory motion blurred many of the images. Overall, the study is of moderate to good diagnostic quality. No filling defects within either main pulmonary artery or their segmental branches in either lung to suggest pulmonary embolism. Heart moderately enlarged. Moderate to large pericardial effusion. No visible coronary atherosclerosis. Note is made of reflux of contrast into the intrahepatic IVC and the a patent veins. Moderate atherosclerosis involving the thoracic and proximal abdominal aorta without evidence of aneurysm. Mediastinum/Nodes: No pathologically enlarged mediastinal, hilar or axillary lymph nodes. No mediastinal masses. Normal-appearing esophagus. Normal-appearing thyroid gland. Lungs/Pleura: Ground-glass airspace opacities throughout both lungs, with evidence of interstitial edema. Large BILATERAL pleural effusions, RIGHT greater than LEFT, with associated passive atelectasis in the lower lobes. Fluid in the minor fissure on the RIGHT central airways patent with no significant bronchial wall thickening. Upper Abdomen: Visualized upper abdomen unremarkable for the early arterial phase of enhancement. Anatomic variant in that the LEFT lobe of the liver extends well across the midline into the LEFT UPPER quadrant. Musculoskeletal: Regional skeleton intact without acute or significant osseous abnormality. Review of the MIP images confirms the above findings. IMPRESSION: 1. No evidence of pulmonary embolism. 2. Mild CHF, with mild interstitial pulmonary edema. 3. Large BILATERAL pleural effusions, RIGHT greater than LEFT, with associated mild passive atelectasis in the lower lobes. 4. Moderate to large pericardial effusion. 5. Reflux of contrast into the IVC and hepatic veins likely indicates RIGHT heart failure and/or tricuspid valvular disease. Aortic Atherosclerosis (ICD10-170.0) Electronically Signed   By: Hulan Saas M.D.   On: 09/19/2017 18:26   Dg Chest Port 1 View  Result Date: 09/19/2017 CLINICAL DATA:  Chest pain EXAM: PORTABLE CHEST 1 VIEW COMPARISON:  09/15/2017 FINDINGS: Cardiac shadow remains enlarged. Aortic calcifications are again seen. The lungs are well aerated bilaterally without focal infiltrate some chronic changes in the left base are seen. No sizable effusion is noted. Nipple shadows are seen. Degenerative changes in the left shoulder joint are seen. IMPRESSION: Stable changes in the left base.  No acute abnormality noted. Electronically Signed   By: Alcide Clever M.D.   On: 09/19/2017 14:48     Medications:  Scheduled: . aspirin EC  81 mg Oral Daily  . calcium acetate  667 mg Oral TID WC  . Chlorhexidine Gluconate Cloth  6 each Topical Q0600  . [START ON 09/22/2017] darbepoetin (ARANESP) injection - DIALYSIS  40 mcg Intravenous Q Tue-HD  . [START ON 09/22/2017] doxercalciferol  3 mcg Intravenous Q T,Th,Sa-HD  . feeding supplement (NEPRO CARB STEADY)  237 mL Oral BID BM  . feeding supplement (PRO-STAT SUGAR FREE 64)  30 mL Oral BID  . heparin  5,000 Units Subcutaneous Q8H  . insulin aspart  0-5 Units Subcutaneous QHS  . insulin aspart  0-9 Units Subcutaneous TID WC  . multivitamin  1 tablet Oral QHS  . pantoprazole  40 mg Oral Daily  . sodium chloride flush  3 mL Intravenous  Q12H   Continuous: . sodium chloride    . sodium chloride    . [START ON 09/22/2017] ferric gluconate (FERRLECIT/NULECIT) IV     WUJ:WJXBJYPRN:sodium chloride, sodium chloride, acetaminophen, albuterol, hydrALAZINE, meclizine, methocarbamol, morphine injection, nitroGLYCERIN, ondansetron, polyethylene glycol, sodium chloride flush, zolpidem  Assessment/Plan:    Dyspnea Most likely secondary to fluid overload and acute on chronic diastolic CHF.  CT scan was negative for PE.  Patient was dialyzed yesterday with improvement in her symptoms.  Continue to monitor.  She will likely need to be dialyzed again  tomorrow.  Acute on chronic diastolic CHF Echocardiogram from 2018 showed EF of 55 to 60% with grade 1 diastolic dysfunction.  Volume is being managed by dialysis.  Seems to be better this morning.  Type 2 diabetes with renal complications, uncontrolled HbA1c 9.2 on May 28.  Recently hospitalized for hypoglycemia.  Her Amaryl and Lantus were discontinued.  Monitor CBGs.  SSI.  Chest pressure Most likely secondary to fluid overload.  Improved with dialysis.  Minimal elevation in troponin most likely due to her renal failure.  No ischemic changes noted on EKG.  Do not anticipate any further work-up at this time.  Dizziness Symptoms were thought to be vertiginous.  She does not have any focal neurological deficits.  She was given meclizine with improvement in her symptoms.  Continue as needed.  She seems to be better.  PT evaluation.  Can hold off on MRI for now.  Pericardial effusion Noted previously as well.  However CT scan does say that it is moderate to large.  Echocardiogram showed small to moderate.  Repeat echocardiogram is pending.  This is all most likely secondary to end-stage renal disease.  End-stage renal disease on hemo-hemodialysis on Tuesday Thursday Saturday  Management per nephrology.  History of GERD Continue PPI  History of stroke Continue aspirin.  History of pubic ramus fracture recently Managed nonsurgically.  Continue with PT.  She is currently at a skilled nursing facility.  Recently treated UTI/indwelling Foley She had enterococcal and Klebsiella UTI.  She still has a Foley catheter, apparently placed at the skilled nursing facility.  She will need voiding trial at the skilled nursing facility.  Normocytic anemia Likely anemia of chronic disease.  Continue to monitor.  Dysphagia Recent esophagram showed tertiary contractions and esophagitis.  However study was not thought to be accurate due to patient's inability to stand secondary to her pubic rami  fracture.  The study will need to be repeated at some point of time.   DVT Prophylaxis: Subcutaneous heparin    Code Status: Full code Family Communication: Discussed with patient.  No family at bedside Disposition Plan: Anticipate discharge back to skilled nursing facility when medically stable.    LOS: 0 days   Osvaldo ShipperGokul Justn Quale  [facility] Pager [phone] 09/20/2017, 11:59 AM  If 7PM-7AM, please contact night-coverage at [email or web address], password [email or web address]

## 2017-09-20 NOTE — Progress Notes (Signed)
@  0004Pt moaning and groaning on tx, bp dropped again but only to low 90's, pt appears to be in pain. Interpreter service called again to ask pt how she feels, pt states she is having a lot of cp and shortness of breath, pt is tachypnic and continues to moan, RR nurse called to come to bedside and assess, tx stopped w/ 1hr 8 min left @ 0007  d/t increasing cp and shortness of breath. @ 0016 RR nurse arrived at bedside to assess.

## 2017-09-20 NOTE — Evaluation (Signed)
Physical Therapy Evaluation Patient Details Name: Frances Reeves MRN: [id number] DOB: 08/17/1950 Today's Date: 09/20/2017   History of Present Illness  Frances Reeves is a 67 y.o. female with medical history significant of diabetes mellitus, stroke, GERD, ESRD-HD (TTS), dCHF, who presents with  SOB, chest pressure, nausea, vomiting, dizziness. rEcent admission after a fall resulting in L sup and inf pubic rami fxs, was WBAT  Clinical Impression   Pt admitted with above diagnosis. Pt currently with functional limitations due to the deficits listed below (see PT Problem List). Presents with decr functional mobility; Still, noting good progress with rehab at SNF, and she is moving much better than last time she was here; Recommend return to SNF to finish out her rehab;  Pt will benefit from skilled PT to increase their independence and safety with mobility to allow discharge to the venue listed below.       Follow Up Recommendations SNF    Equipment Recommendations  Rolling walker with 5" wheels;3in1 (PT);Other (comment)((youth sized))    Recommendations for Other Services       Precautions / Restrictions Precautions Precautions: Fall Restrictions RLE Weight Bearing: Weight bearing as tolerated LLE Weight Bearing: Weight bearing as tolerated      Mobility  Bed Mobility Overal bed mobility: Needs Assistance Bed Mobility: Supine to Sit     Supine to sit: Min guard     General bed mobility comments: Incr time; close guard for safety, but did not need physical assist  Transfers Overall transfer level: Needs assistance Equipment used: Rolling walker (2 wheeled)(youth-sized) Transfers: Sit to/from Stand Sit to Stand: Min guard         General transfer comment: Moves slowly; Cues for hand placement and safety; Good rise  Ambulation/Gait Ambulation/Gait assistance: Min guard(second person helpful to bring video interpreter along) Ambulation Distance (Feet): 60  Feet Assistive device: Rolling walker (2 wheeled) Gait Pattern/deviations: Decreased step length - right;Decreased step length - left;Decreased stride length Gait velocity: slowed   General Gait Details: Cues to self-monitor for activity tolerance; noting much imporved smoothness of steps, and better weigh acceptance LLE  Stairs            Wheelchair Mobility    Modified Rankin (Stroke Patients Only)       Balance     Sitting balance-Leahy Scale: Good       Standing balance-Leahy Scale: Fair Standing balance comment: reliant on UE support with amb; able to stand at sink for ADL without gross loss of balance                             Pertinent Vitals/Pain Pain Assessment: Faces Faces Pain Scale: Hurts little more Pain Location: L hip/groin --  she reported feeling better after walking; also discomfort/itching at vulva Pain Descriptors / Indicators: Discomfort;Sore Pain Intervention(s): Monitored during session;Other (comment)(Notified RN)    Home Living Family/patient expects to be discharged to:: Skilled nursing facility Living Arrangements: Alone Available Help at Discharge: Family;Available PRN/intermittently Type of Home: Apartment Home Access: Ramped entrance     Home Layout: One level Home Equipment: Cane - quad;Cane - single point;Grab bars - tub/shower      Prior Function Level of Independence: Independent with assistive device(s)         Comments: Cane for mobility on HD days, reports hx of falls in the shower     Hand Dominance        Extremity/Trunk Assessment  Upper Extremity Assessment Upper Extremity Assessment: Defer to OT evaluation    Lower Extremity Assessment Lower Extremity Assessment: Generalized weakness;LLE deficits/detail RLE Deficits / Details: Moves well through all planes of motion LLE Deficits / Details: able to perform straight leg raise against gravity; reports pain, but is moving and stepping LLE and  accepting weight onto LLE well       Communication   Communication: No difficulties;Prefers language other than [place];Interpreter utilized([employer] interpreter, [place], [phone])  Cognition Arousal/Alertness: Awake/alert Behavior During Therapy: WFL for tasks assessed/performed Overall Cognitive Status: Within Functional Limits for tasks assessed                                        General Comments General comments (skin integrity, edema, etc.): Majority of session conducted without supplemental O2; O2 sats 95-96% at the end of session; kept supplemental O2 off at end of session    Exercises     Assessment/Plan    PT Assessment Patient needs continued PT services  PT Problem List Decreased strength;Decreased range of motion;Decreased activity tolerance;Decreased balance;Decreased mobility;Decreased knowledge of use of DME;Decreased knowledge of precautions;Pain       PT Treatment Interventions DME instruction;Gait training;Functional mobility training;Therapeutic activities;Therapeutic exercise;Patient/family education;Balance training    PT Goals (Current goals can be found in the Care Plan section)  Acute Rehab PT Goals Patient Stated Goal: decrease pain, get better PT Goal Formulation: With patient Time For Goal Achievement: 10/04/17 Potential to Achieve Goals: Good    Frequency Min 3X/week   Barriers to discharge Decreased caregiver support      Co-evaluation PT/OT/SLP Co-Evaluation/Treatment: Yes Reason for Co-Treatment: Other (comment);To address functional/ADL transfers;For patient/therapist safety(to facilitate communication with video interpreter as well) PT goals addressed during session: Mobility/safety with mobility         AM-PAC PT "6 Clicks" Daily Activity  Outcome Measure Difficulty turning over in bed (including adjusting bedclothes, sheets and blankets)?: A Little Difficulty moving from lying on back to sitting on the side of the  bed? : A Lot Difficulty sitting down on and standing up from a chair with arms (e.g., wheelchair, bedside commode, etc,.)?: A Little Help needed moving to and from a bed to chair (including a wheelchair)?: A Little Help needed walking in hospital room?: A Little Help needed climbing 3-5 steps with a railing? : A Lot 6 Click Score: 16    End of Session Equipment Utilized During Treatment: Gait belt Activity Tolerance: Patient tolerated treatment well Patient left: in chair;with call bell/phone within reach Nurse Communication: Mobility status PT Visit Diagnosis: Other abnormalities of gait and mobility (R26.89);History of falling (Z91.81);Pain Pain - Right/Left: Left Pain - part of body: Hip(and groin)    Time: 0454-0981 PT Time Calculation (min) (ACUTE ONLY): 41 min   Charges:   PT Evaluation $PT Eval Moderate Complexity: 1 Mod PT Treatments $Gait Training: 8-22 mins   PT G Codes:        Van Clines, PT  Acute Rehabilitation Services Pager [phone] Office [phone]   Levi Aland 09/20/2017, 12:18 PM

## 2017-09-20 NOTE — Progress Notes (Signed)
Report called to Thermon LeylandKim Gengler, RN, Dr. Arlean HoppingSchertz was paged and made aware of situation @ [phone]

## 2017-09-20 NOTE — Progress Notes (Addendum)
[facility] Progress Note   Dialysis Orders: [facility]  4h  2/2.25 bath  Heparin 1400 400/600 lua avf  43.5 - got to 42.9 5/30 net UF 2.2 no BP drop (usually drops BP) Hectorol 3, no ESA or Fe hgb 13 5/21 11 5/30 tsat 32% - last Mircera 100 4/20   Assessment/Plan: 1. Dyspnea/CP- CM/pulmonary edema/large bilateral  R> L pleural effusions by CXR/CT angio- poorly tolerant of UF goal Saturday with HD BP drop into 80-90s s  - had to reduce goal net only UF 1565 cc - no weights Echo pending- plan HD Monday to help decrease volume and then would check repeat CXR for comparison after HD 2. ESRD - TTS - Episode of CP on HD requiring rapid response - EKG showed no acute change-  Morphine relieved CP. Cycling troponins. Extra tmt for Monday -  3. Anemia - hgb 10.8- last outpt Hgb 11 5/30 - tsat 32% - resume weekly Fe- start low dose ESA 4. Secondary hyperparathyroidism - Ca 8.5 - resumed Hectorol. Cont binders 5. HTN/volume - lowering volume- she can stand for weights - weight today is 44.4 kg per standing scale 6. Nutrition - alb 2.9 ; suspect unintentional weight loss at NH 7. Recent Enterococcal and klebsiella UTI 5/28 - still has foley - not clear if she had a trial without after last d/c to see if problems with urinary retention which was recommended after last d/c 8. Recent Left closed pubic ramus fx-  9. DM - recent admission for hypoglycemia - possibly related to UTI 9. Disp - [facility]  Sheffield SliderMartha B Bergman, PA-C [facility] Beeper [phone] 09/20/2017,9:31 AM  LOS: 0 days   Pt seen, examined and agree w A/P as above.  Vinson Moselleob Ronny Korff MD [facility] pager [phone]   09/20/2017, 1:27 PM    Subjective:   Feels better, ate most of breakfast.   Objective Vitals:   09/20/17 0152 09/20/17 0436 09/20/17 0700 09/20/17 0920  BP: (!) 117/49 (!) 144/57  (!) 133/52  Pulse: 80 86  81  Resp: 16 16  16   Temp: 98 F (36.7 C) 98 F (36.7 C)   98.2 F (36.8 C)  TempSrc: Oral Oral  Oral  SpO2: 100% 100%  100%  Height:   4\' 10"  (1.473 m)    Physical Exam General: petite elderly lady pleasant, comfortable breathing easily Heart: RRR Lungs: dim base with bilateral crackles R > L 1/3 Abdomen: soft NT Extremities: no LE edema  Dialysis Access: left upper AVF + bruit   Additional Objective Labs: Basic Metabolic Panel: Recent Labs  Lab 09/15/17 0215 09/16/17 0411 09/19/17 1340  NA 128* 135 131*  K 5.0 3.9 4.4  CL 87* 95* 90*  CO2 29 28 27   GLUCOSE 250* 122* 152*  BUN 62* 23* 40*  CREATININE 4.98* 2.51* 3.98*  CALCIUM 8.5* 7.9* 8.5*   Liver Function Tests: Recent Labs  Lab 09/15/17 0215 09/19/17 1340  AST 23 21  ALT 14 13*  ALKPHOS 133* 135*  BILITOT 0.4 0.6  PROT 6.9 6.6  ALBUMIN 2.9* 2.9*   Recent Labs  Lab 09/20/17 0458  LIPASE 39   CBC: Recent Labs  Lab 09/15/17 0215 09/16/17 0411 09/19/17 1340  WBC 9.5 5.8 5.4  NEUTROABS 8.1*  --  3.8  HGB 12.0 10.3* 10.8*  HCT 37.3 32.2* 33.4*  MCV 92.1 92.5 91.3  PLT 400 336 328   Blood Culture    Component Value  Date/Time   SDES BLOOD RIGHT FOREARM 09/15/2017 1109   SPECREQUEST  09/15/2017 1109    BOTTLES DRAWN AEROBIC AND ANAEROBIC Blood Culture adequate volume   CULT  09/15/2017 1109    NO GROWTH 4 DAYS Performed at [facility], [address] [place], [place] [address]    REPTSTATUS PENDING 09/15/2017 1109    Cardiac Enzymes: Recent Labs  Lab 09/20/17 0458  TROPONINI 0.06*   CBG: Recent Labs  Lab 09/16/17 0726 09/16/17 1152 09/19/17 1338 09/20/17 0238 09/20/17 0716  GLUCAP 78 159* 159* 166* 96   Iron Studies: No results for input(s): IRON, TIBC, TRANSFERRIN, FERRITIN in the last 72 hours. Lab Results  Component Value Date   INR 1.03 02/02/2017   Studies/Results: Ct Angio Chest Pe W And/or Wo Contrast  Result Date: 09/19/2017 CLINICAL DATA:  67 year old with acute onset of shortness of breath and chest heaviness  that began yesterday, associated with anxiety, nausea and vomiting. EXAM: CT ANGIOGRAPHY CHEST WITH CONTRAST TECHNIQUE: Multidetector CT imaging of the chest was performed using the standard protocol during bolus administration of intravenous contrast. Multiplanar CT image reconstructions and MIPs were obtained to evaluate the vascular anatomy. CONTRAST:  ISOVUE-370 IOPAMIDOL INJECTION 76% IV. COMPARISON:  No prior chest CT. Multiple prior chest x-rays, including earlier same day. FINDINGS: Cardiovascular: Contrast opacification of pulmonary arteries is very good. Respiratory motion blurred many of the images. Overall, the study is of moderate to good diagnostic quality. No filling defects within either main pulmonary artery or their segmental branches in either lung to suggest pulmonary embolism. Heart moderately enlarged. Moderate to large pericardial effusion. No visible coronary atherosclerosis. Note is made of reflux of contrast into the intrahepatic IVC and the a patent veins. Moderate atherosclerosis involving the thoracic and proximal abdominal aorta without evidence of aneurysm. Mediastinum/Nodes: No pathologically enlarged mediastinal, hilar or axillary lymph nodes. No mediastinal masses. Normal-appearing esophagus. Normal-appearing thyroid gland. Lungs/Pleura: Ground-glass airspace opacities throughout both lungs, with evidence of interstitial edema. Large BILATERAL pleural effusions, RIGHT greater than LEFT, with associated passive atelectasis in the lower lobes. Fluid in the minor fissure on the RIGHT central airways patent with no significant bronchial wall thickening. Upper Abdomen: Visualized upper abdomen unremarkable for the early arterial phase of enhancement. Anatomic variant in that the LEFT lobe of the liver extends well across the midline into the LEFT UPPER quadrant. Musculoskeletal: Regional skeleton intact without acute or significant osseous abnormality. Review of the MIP images  confirms the above findings. IMPRESSION: 1. No evidence of pulmonary embolism. 2. Mild CHF, with mild interstitial pulmonary edema. 3. Large BILATERAL pleural effusions, RIGHT greater than LEFT, with associated mild passive atelectasis in the lower lobes. 4. Moderate to large pericardial effusion. 5. Reflux of contrast into the IVC and hepatic veins likely indicates RIGHT heart failure and/or tricuspid valvular disease. Aortic Atherosclerosis (ICD10-170.0) Electronically Signed   By: Hulan Saas M.D.   On: 09/19/2017 18:26   Dg Chest Port 1 View  Result Date: 09/19/2017 CLINICAL DATA:  Chest pain EXAM: PORTABLE CHEST 1 VIEW COMPARISON:  09/15/2017 FINDINGS: Cardiac shadow remains enlarged. Aortic calcifications are again seen. The lungs are well aerated bilaterally without focal infiltrate some chronic changes in the left base are seen. No sizable effusion is noted. Nipple shadows are seen. Degenerative changes in the left shoulder joint are seen. IMPRESSION: Stable changes in the left base.  No acute abnormality noted. Electronically Signed   By: Alcide Clever M.D.   On: 09/19/2017 14:48  Medications: . sodium chloride    . sodium chloride     . aspirin EC  81 mg Oral Daily  . calcium acetate  667 mg Oral TID WC  . Chlorhexidine Gluconate Cloth  6 each Topical Q0600  . feeding supplement (NEPRO CARB STEADY)  237 mL Oral BID BM  . feeding supplement (PRO-STAT SUGAR FREE 64)  30 mL Oral BID  . heparin  5,000 Units Subcutaneous Q8H  . insulin aspart  0-5 Units Subcutaneous QHS  . insulin aspart  0-9 Units Subcutaneous TID WC  . multivitamin  1 tablet Oral QHS  . pantoprazole  40 mg Oral Daily  . sodium chloride flush  3 mL Intravenous Q12H

## 2017-09-21 ENCOUNTER — Encounter: Payer: Self-pay | Admitting: Internal Medicine

## 2017-09-21 DIAGNOSIS — R0600 Dyspnea, unspecified: Secondary | ICD-10-CM | POA: Diagnosis not present

## 2017-09-21 DIAGNOSIS — Z992 Dependence on renal dialysis: Secondary | ICD-10-CM | POA: Diagnosis not present

## 2017-09-21 DIAGNOSIS — E1122 Type 2 diabetes mellitus with diabetic chronic kidney disease: Secondary | ICD-10-CM | POA: Diagnosis not present

## 2017-09-21 DIAGNOSIS — N186 End stage renal disease: Secondary | ICD-10-CM | POA: Diagnosis not present

## 2017-09-21 LAB — RENAL FUNCTION PANEL
Albumin: 2.8 g/dL — ABNORMAL LOW (ref 3.5–5.0)
Anion gap: 13 (ref 5–15)
BUN: 44 mg/dL — ABNORMAL HIGH (ref 6–20)
CO2: 28 mmol/L (ref 22–32)
Calcium: 8.5 mg/dL — ABNORMAL LOW (ref 8.9–10.3)
Chloride: 89 mmol/L — ABNORMAL LOW (ref 101–111)
Creatinine, Ser: 3.99 mg/dL — ABNORMAL HIGH (ref 0.44–1.00)
GFR calc Af Amer: 12 mL/min — ABNORMAL LOW (ref >60)
GFR calc non Af Amer: 11 mL/min — ABNORMAL LOW (ref >60)
Glucose, Bld: 129 mg/dL — ABNORMAL HIGH (ref 65–99)
Phosphorus: 4.7 mg/dL — ABNORMAL HIGH (ref 2.5–4.6)
Potassium: 4.1 mmol/L (ref 3.5–5.1)
Sodium: 130 mmol/L — ABNORMAL LOW (ref 135–145)

## 2017-09-21 LAB — BASIC METABOLIC PANEL
ANION GAP: 9 (ref 5–15)
BUN: 42 mg/dL — ABNORMAL HIGH (ref 6–20)
CHLORIDE: 91 mmol/L — AB (ref 101–111)
CO2: 30 mmol/L (ref 22–32)
Calcium: 8.1 mg/dL — ABNORMAL LOW (ref 8.9–10.3)
Creatinine, Ser: 3.93 mg/dL — ABNORMAL HIGH (ref 0.44–1.00)
GFR calc Af Amer: 12 mL/min — ABNORMAL LOW (ref >60)
GFR, EST NON AFRICAN AMERICAN: 11 mL/min — AB (ref >60)
Glucose, Bld: 128 mg/dL — ABNORMAL HIGH (ref 65–99)
POTASSIUM: 4.1 mmol/L (ref 3.5–5.1)
SODIUM: 130 mmol/L — AB (ref 135–145)

## 2017-09-21 LAB — CBC
HCT: 30.2 % — ABNORMAL LOW (ref 36.0–46.0)
Hemoglobin: 9.7 g/dL — ABNORMAL LOW (ref 12.0–15.0)
MCH: 29.5 pg (ref 26.0–34.0)
MCHC: 32.1 g/dL (ref 30.0–36.0)
MCV: 91.8 fL (ref 78.0–100.0)
Platelets: 315 10*3/uL (ref 150–400)
RBC: 3.29 MIL/uL — ABNORMAL LOW (ref 3.87–5.11)
RDW: 14.1 % (ref 11.5–15.5)
WBC: 5.3 10*3/uL (ref 4.0–10.5)

## 2017-09-21 LAB — URINE CULTURE: Culture: 50000 — AB

## 2017-09-21 LAB — GLUCOSE, CAPILLARY
GLUCOSE-CAPILLARY: 123 mg/dL — AB (ref 65–99)
GLUCOSE-CAPILLARY: 180 mg/dL — AB (ref 65–99)
Glucose-Capillary: 185 mg/dL — ABNORMAL HIGH (ref 65–99)

## 2017-09-21 MED ORDER — DARBEPOETIN ALFA 40 MCG/0.4ML IJ SOSY: 40.0000 ug | PREFILLED_SYRINGE | INTRAMUSCULAR | Status: AC

## 2017-09-21 MED ORDER — MECLIZINE HCL 12.5 MG PO TABS: 12.5000 mg | ORAL_TABLET | Freq: Three times a day (TID) | ORAL | 0 refills | Status: AC | PRN

## 2017-09-21 MED ORDER — HEPARIN SODIUM (PORCINE) 1000 UNIT/ML DIALYSIS: 1000.0000 [IU] | INTRAMUSCULAR | Status: DC | PRN

## 2017-09-21 MED ORDER — DOXERCALCIFEROL 4 MCG/2ML IV SOLN: 3.0000 ug | INTRAVENOUS | Status: AC

## 2017-09-21 MED ORDER — LIDOCAINE HCL (PF) 1 % IJ SOLN: 5.0000 mL | INTRAMUSCULAR | Status: DC | PRN

## 2017-09-21 MED ORDER — SODIUM CHLORIDE 0.9 % IV SOLN: 62.5000 mg | INTRAVENOUS | Status: AC

## 2017-09-21 MED ORDER — SODIUM CHLORIDE 0.9 % IV SOLN: 100.0000 mL | INTRAVENOUS | Status: DC | PRN

## 2017-09-21 MED ORDER — ASPIRIN 81 MG PO TBEC: 81.0000 mg | DELAYED_RELEASE_TABLET | Freq: Every day | ORAL | Status: AC

## 2017-09-21 MED ORDER — LIDOCAINE-PRILOCAINE 2.5-2.5 % EX CREA
1.0000 | TOPICAL_CREAM | CUTANEOUS | Status: DC | PRN
Start: 2017-09-21 — End: 2017-09-21

## 2017-09-21 MED ORDER — PENTAFLUOROPROP-TETRAFLUOROETH EX AERO: 1.0000 "application " | INHALATION_SPRAY | CUTANEOUS | Status: DC | PRN

## 2017-09-21 MED ORDER — HEPARIN SODIUM (PORCINE) 1000 UNIT/ML DIALYSIS: 20.0000 [IU]/kg | INTRAMUSCULAR | Status: DC | PRN

## 2017-09-21 MED ORDER — ALTEPLASE 2 MG IJ SOLR: 2.0000 mg | Freq: Once | INTRAMUSCULAR | Status: DC | PRN

## 2017-09-21 MED ORDER — ACETAMINOPHEN 325 MG PO TABS
ORAL_TABLET | ORAL | Status: AC
  Filled 2017-09-21: qty 2

## 2017-09-21 NOTE — Clinical Social Work Note (Signed)
Patient medically stable for [facility]. Discharge clinicals transmitted to facility and daughter, Frances Reeves contacted [phone]) and informed of discharge. Ms. Frances Reeves will be transported by ambulance. CSW signing off as no other SW intervention services needed.  Genelle BalVanessa Nashley Cordoba, MSW, LCSW Licensed Clinical Social Worker Clinical Social Work Department [facility] [phone]

## 2017-09-21 NOTE — Progress Notes (Signed)
Report called to [facility]Mary [facility]. Dondra SpryMoore, Altagracia Rone Islee, RN

## 2017-09-21 NOTE — Clinical Social Work Note (Signed)
Clinical Social Work Assessment  Patient Details  Name: Frances Reeves MRN: [id number] Date of Birth: 04/12/1951  Date of referral:  09/21/17               Reason for consult:  Discharge Planning                Permission sought to share information with:  Family Supports Permission granted to share information::  Yes, Verbal Permission Granted  Name::     Frances Reeves  Agency::     Relationship::  Daughter  Contact Information:  [phone]  Housing/Transportation Living arrangements for the past 2 months:  Skilled Nursing Facility([facility]) Source of Information:  Other (Comment Required), Patient(Frances Reeves, [facility]asion with [facility]) Patient Interpreter Needed:  Spanish(used video interpreting service) Criminal Activity/Legal Involvement Pertinent to Current Situation/Hospitalization:  No - Comment as needed Significant Relationships:  Adult Children, Other Family Members Lives with:  Facility [name]) Do you feel safe going back to the place where you live?  Yes Need for family participation in patient care:  Yes (Comment)  Care giving concerns:  Patient and daughter agreeable to the return to [facility] to continue [facility] and medical care.  Social Worker assessment / plan: CSW visited with patient to talk with her about her discharge disposition. [employer] video interpreting service used with interpreter [phone]. Patient informed that she is ready for discharge and confirmed her intent to return to [facility]. Patient agreeable to her daughter being contacted regarding her discharge. Patient informed that she will be transported by ambulance.  Call made to patient's daughter Frances Reeves and informed her of her mother's readiness for discharge. Ms. Frances Reeves was not aware that her mother was in the hospital and reported that she has not been able to visit with her mom due to her work schedule and moving. CSW informed daughter that  the hospital liaison for [facility] will be advised that she was not contacted.  Employment status:  Retired Health and safety inspectornsurance information:  Armed forces operational officerMedicare, [employer] [place] Recommendations:  Skilled Nursing Facility Information / Referral to community resources:  Other (Comment Required)(None needed or requested as patient from SNF and will return at discharge)  Patient/Family's Response to care:  Patient did not express any concerns regarding her care during hospitalization.  Patient/Family's Understanding of and Emotional Response to Diagnosis, Current Treatment, and Prognosis:  Not discussed.  Emotional Assessment Appearance:  Appears stated age Attitude/Demeanor/Rapport:  Other(Pleasant) Affect (typically observed):  Pleasant, Appropriate Orientation:  Oriented to Self, Oriented to Situation, Oriented to Place Alcohol / Substance use:  Never Used Psych involvement (Current and /or in the community):  No (Comment)  Discharge Needs  Concerns to be addressed:  Discharge Planning Concerns Readmission within the last 30 days:  Yes Current discharge risk:  None Barriers to Discharge:  No Barriers Identified   Cristobal GoldmannCrawford, Carsen Leaf Bradley, LCSW 09/21/2017, 1:38 PM

## 2017-09-21 NOTE — Discharge Summary (Signed)
[facility]  Physician Discharge Summary   Patient ID: Frances Reeves MRN: [id number] DOB/AGE: 69/14/1952 69 y.o.  Admit date: 09/19/2017 Discharge date: 09/21/2017  DISCHARGE DIAGNOSES:  Fluid overload End-stage renal disease  RECOMMENDATIONS FOR OUTPATIENT FOLLOW UP: 1. Patient to resume outpatient dialysis schedule starting tomorrow, Tuesday, June 4. 2. Only use sensitive sliding scale coverage.   DISCHARGE CONDITION: fair  Diet recommendation: Deferred carbohydrate  Filed Weights   09/21/17 0500 09/21/17 0501 09/21/17 0800  Weight: 44.3 kg (97 lb 10.6 oz) 44.3 kg (97 lb 9.6 oz) 47 kg (103 lb 9.9 oz)    INITIAL HISTORY: 67 year old female with a past medical history of diabetes mellitus, stroke, GERD, end-stage renal disease on hemodialysis on Tuesday Thursday Saturday, diastolic CHF presented with shortness of breath chest pain nausea or dizziness.  Patient was thought to be fluid overloaded.  CT scan was negative for PE.  Did show pericardial effusion.  Patient was hospitalized for further management.  She underwent dialysis on 6/1.  Consultants: Nephrology  Procedures:   Transthoracic echocardiogram Study Conclusions  - Left ventricle: The cavity size was normal. Wall thickness was   increased in a pattern of mild LVH. Systolic function was normal.   The estimated ejection fraction was in the range of 55% to 60%.   Wall motion was normal; there were no regional wall motion   abnormalities. Doppler parameters are consistent with abnormal   left ventricular relaxation (grade 1 diastolic dysfunction).   Doppler parameters are consistent with high ventricular filling   pressure. - Aortic valve: There was mild regurgitation. - Mitral valve: There was moderate eccentric regurgitation. - Left atrium: The atrium was severely dilated. - Tricuspid valve: There was mild regurgitation. - Pulmonary arteries: PA peak pressure: 31 mm Hg (S). - Pericardium,  extracardiac: Moderate size pericardial effusion   seen. There was a mild degree of right atrial inversion but no   diastolic right-sided chamber collapse to suggest tamponade   physiology.   Patient was hemodialyzed on 6/1 and then 6/3  HOSPITAL COURSE:   Dyspnea Most likely secondary to fluid overload and acute on chronic diastolic CHF.  CT scan was negative for PE.  Patient was dialyzed with improvement in her symptoms.    She is saturating normal on room.  Acute on chronic diastolic CHF Echocardiogram from 2018 showed EF of 55 to 60% with grade 1 diastolic dysfunction.  Volume is being managed by dialysis.  Stable.  Type 2 diabetes with renal complications, uncontrolled HbA1c 9.2 on May 28.  Recently hospitalized for hypoglycemia.  Her Amaryl and Lantus were discontinued.    Appears to have brittle diabetes.  CBGs have been reasonably well controlled.  Continue to just keep her on sliding scale coverage sensitive level.  No other oral agents or long-acting insulin for now.  Monitor CBGs.    Chest pressure Most likely secondary to fluid overload.  Improved with dialysis.  Minimal elevation in troponin most likely due to her renal failure.  No ischemic changes noted on EKG.  Do not anticipate any further work-up at this time.  She denies any chest pain.  Dizziness Symptoms were thought to be vertiginous.  She does not have any focal neurological deficits.  She was given meclizine with improvement in her symptoms.  Continue as needed.  She seems to be better.  PT and OT.  Will need continued rehab at skilled nursing facility.  No focal neurological deficits noted.  No need for MRI brain at this  time.  Pericardial effusion Noted previously as well.  However CT scan does say that it is moderate to large.  Echocardiogram showed small to moderate.    Echocardiogram shows a moderate pericardial effusion.  No evidence for tamponade.  Findings discussed with Dr. Elease Hashimoto with cardiology.  We  reviewed previous echocardiograms and CT scans.  CT scan tends to overestimate pericardial effusion.  Patient remains hemodynamically stable.  He recommends perhaps pulling of more volume with dialysis at this appears to be volume related, especially considering the pleural effusions noted on CT scan as well.    End-stage renal disease on hemo-hemodialysis on Tuesday Thursday Saturday  Patient dialyzed on 6/1 and 6/3.  She may resume her usual dialysis schedule from tomorrow.  History of GERD Continue PPI  History of stroke Continue aspirin.  History of pubic ramus fracture recently Managed nonsurgically.  Continue with PT.  She is currently at a skilled nursing facility.  She appears to be making progress.  She did ambulate 60 feet with physical therapy.  Recently treated UTI/indwelling Foley She had enterococcal and Klebsiella UTI.    She was treated recently with fosfomycin.  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  Urine culture grew only 50,000 colonies of enterococcus.  She has an indwelling Foley catheter.  Apparently placed at skilled nursing facility for ?retention.  She will need a voiding trial which we will defer to the skilled nursing facility at this time.  She will need to be discharged with the Foley catheter.  Normocytic anemia Likely anemia of chronic disease.  Hemoglobin has been stable.  Dysphagia Recent esophagram showed tertiary contractions and esophagitis.  However study was not thought to be accurate due to patient's inability to stand secondary to her pubic rami fracture.  The study will need to be repeated at some point of time.  Overall stable.  Seen at dialysis this morning.  She denies any complaints.  No focal neurological deficits on examination.  Discussed with nephrology.  Okay for discharge back to skilled nursing facility today after dialysis.     PERTINENT LABS:  The results of significant diagnostics from this hospitalization (including imaging,  microbiology, ancillary and laboratory) are listed below for reference.      Labs: Basic Metabolic Panel: Recent Labs  Lab 09/16/17 0411 09/19/17 1340 09/20/17 0704 09/21/17 0629 09/21/17 0842  NA 135 131* 132* 130* 130*  K 3.9 4.4 3.6 4.1 4.1  CL 95* 90* 89* 91* 89*  CO2 28 27 31 30 28   GLUCOSE 122* 152* 92 128* 129*  BUN 23* 40* 17 42* 44*  CREATININE 2.51* 3.98* 2.63* 3.93* 3.99*  CALCIUM 7.9* 8.5* 8.1* 8.1* 8.5*  PHOS  --   --   --   --  4.7*   Liver Function Tests: Recent Labs  Lab 09/15/17 0215 09/19/17 1340 09/21/17 0842  AST 23 21  --   ALT 14 13*  --   ALKPHOS 133* 135*  --   BILITOT 0.4 0.6  --   PROT 6.9 6.6  --   ALBUMIN 2.9* 2.9* 2.8*   Recent Labs  Lab 09/20/17 0458  LIPASE 39   CBC: Recent Labs  Lab 09/15/17 0215 09/16/17 0411 09/19/17 1340 09/21/17 0842  WBC 9.5 5.8 5.4 5.3  NEUTROABS 8.1*  --  3.8  --   HGB 12.0 10.3* 10.8* 9.7*  HCT 37.3 32.2* 33.4* 30.2*  MCV 92.1 92.5 91.3 91.8  PLT 400 336 328 315   Cardiac Enzymes: Recent Labs  Lab 09/20/17 0458 09/20/17 0704  TROPONINI 0.06* 0.06*   CBG: Recent Labs  Lab 09/20/17 0716 09/20/17 1112 09/20/17 1619 09/20/17 2115 09/21/17 0736  GLUCAP 96 204* 78 149* 123*     IMAGING STUDIES Ct Angio Chest Pe W And/or Wo Contrast  Result Date: 09/19/2017 CLINICAL DATA:  67 year old with acute onset of shortness of breath and chest heaviness that began yesterday, associated with anxiety, nausea and vomiting. EXAM: CT ANGIOGRAPHY CHEST WITH CONTRAST TECHNIQUE: Multidetector CT imaging of the chest was performed using the standard protocol during bolus administration of intravenous contrast. Multiplanar CT image reconstructions and MIPs were obtained to evaluate the vascular anatomy. CONTRAST:  ISOVUE-370 IOPAMIDOL INJECTION 76% IV. COMPARISON:  No prior chest CT. Multiple prior chest x-rays, including earlier same day. FINDINGS: Cardiovascular: Contrast opacification of pulmonary  arteries is very good. Respiratory motion blurred many of the images. Overall, the study is of moderate to good diagnostic quality. No filling defects within either main pulmonary artery or their segmental branches in either lung to suggest pulmonary embolism. Heart moderately enlarged. Moderate to large pericardial effusion. No visible coronary atherosclerosis. Note is made of reflux of contrast into the intrahepatic IVC and the a patent veins. Moderate atherosclerosis involving the thoracic and proximal abdominal aorta without evidence of aneurysm. Mediastinum/Nodes: No pathologically enlarged mediastinal, hilar or axillary lymph nodes. No mediastinal masses. Normal-appearing esophagus. Normal-appearing thyroid gland. Lungs/Pleura: Ground-glass airspace opacities throughout both lungs, with evidence of interstitial edema. Large BILATERAL pleural effusions, RIGHT greater than LEFT, with associated passive atelectasis in the lower lobes. Fluid in the minor fissure on the RIGHT central airways patent with no significant bronchial wall thickening. Upper Abdomen: Visualized upper abdomen unremarkable for the early arterial phase of enhancement. Anatomic variant in that the LEFT lobe of the liver extends well across the midline into the LEFT UPPER quadrant. Musculoskeletal: Regional skeleton intact without acute or significant osseous abnormality. Review of the MIP images confirms the above findings. IMPRESSION: 1. No evidence of pulmonary embolism. 2. Mild CHF, with mild interstitial pulmonary edema. 3. Large BILATERAL pleural effusions, RIGHT greater than LEFT, with associated mild passive atelectasis in the lower lobes. 4. Moderate to large pericardial effusion. 5. Reflux of contrast into the IVC and hepatic veins likely indicates RIGHT heart failure and/or tricuspid valvular disease. Aortic Atherosclerosis (ICD10-170.0) Electronically Signed   By: Hulan Saas M.D.   On: 09/19/2017 18:26    Dg Chest Port 1  View  Result Date: 09/19/2017 CLINICAL DATA:  Chest pain EXAM: PORTABLE CHEST 1 VIEW COMPARISON:  09/15/2017 FINDINGS: Cardiac shadow remains enlarged. Aortic calcifications are again seen. The lungs are well aerated bilaterally without focal infiltrate some chronic changes in the left base are seen. No sizable effusion is noted. Nipple shadows are seen. Degenerative changes in the left shoulder joint are seen. IMPRESSION: Stable changes in the left base.  No acute abnormality noted. Electronically Signed   By: Alcide Clever M.D.   On: 09/19/2017 14:48     DISCHARGE EXAMINATION: Vitals:   09/21/17 1030 09/21/17 1045 09/21/17 1100 09/21/17 1115  BP: (!) 114/39 (!) 116/48 (!) 117/42 (!) 116/43  Pulse:      Resp: 13 12 15 13   Temp:      TempSrc:      SpO2:      Weight:      Height:       General appearance: alert, cooperative, appears stated age and no distress Resp: clear to auscultation bilaterally Cardio: regular rate and  rhythm, S1, S2 normal, no murmur, click, rub or gallop GI: soft, non-tender; bowel sounds normal; no masses,  no organomegaly Extremities: No edema Cranial nerves II to XII intact.  Motor strength equal bilateral upper and lower extremities.  No nystagmus.  No positive cerebellar signs.  DISPOSITION: Skilled nursing facility  Discharge Instructions    Call MD for:  extreme fatigue   Complete by:  As directed    Call MD for:  hives   Complete by:  As directed    Call MD for:  persistant dizziness or light-headedness   Complete by:  As directed    Call MD for:  persistant nausea and vomiting   Complete by:  As directed    Call MD for:  temperature >100.4   Complete by:  As directed    Discharge instructions   Complete by:  As directed    Please review instructions on the discharge summary.  You were cared for by a hospitalist during your hospital stay. If you have any questions about your discharge medications or the care you received while you were in the  hospital after you are discharged, you can call the unit and asked to speak with the hospitalist on call if the hospitalist that took care of you is not available. Once you are discharged, your primary care physician will handle any further medical issues. Please note that NO REFILLS for any discharge medications will be authorized once you are discharged, as it is imperative that you return to your primary care physician (or establish a relationship with a primary care physician if you do not have one) for your aftercare needs so that they can reassess your need for medications and monitor your lab values. If you do not have a primary care physician, you can call [phone] for a physician referral.   Increase activity slowly   Complete by:  As directed         Allergies as of 09/21/2017   No Known Allergies     Medication List    STOP taking these medications   fosfomycin 3 g Pack Commonly known as:  MONUROL     TAKE these medications   acetaminophen 650 MG CR tablet Commonly known as:  TYLENOL Take 650 mg by mouth every 8 (eight) hours as needed for pain.   aspirin 81 MG EC tablet Take 1 tablet (81 mg total) by mouth daily.   calcium acetate 667 MG capsule Commonly known as:  PHOSLO Take 667 mg by mouth 3 (three) times daily with meals.   Darbepoetin Alfa 40 MCG/0.4ML Sosy injection Commonly known as:  ARANESP Inject 0.4 mLs (40 mcg total) into the vein every Tuesday with hemodialysis. Start taking on:  09/22/2017   doxercalciferol 4 MCG/2ML injection Commonly known as:  HECTOROL Inject 1.5 mLs (3 mcg total) into the vein Every Tuesday,Thursday,and Saturday with dialysis. Start taking on:  09/22/2017   feeding supplement (NEPRO CARB STEADY) Liqd Take 237 mLs by mouth 2 (two) times daily between meals.   feeding supplement (PRO-STAT SUGAR FREE 64) Liqd Take 30 mLs by mouth 2 (two) times daily.   ferric gluconate 62.5 mg in sodium chloride 0.9 % 100 mL Inject 62.5 mg into the  vein every Tuesday with hemodialysis. Start taking on:  09/22/2017   insulin aspart 100 UNIT/ML FlexPen Commonly known as:  NOVOLOG FLEXPEN Inject 0-9 Units into the skin 3 (three) times daily with meals. CBG < 70: implement hypoglycemia protocol CBG 70 - 120:  0 units CBG 121 - 150: 1 unit CBG 151 - 200: 2 units CBG 201 - 250: 3 units CBG 251 - 300: 5 units CBG 301 - 350: 7 units CBG 351 - 400: 9 units CBG > 400: call MD.   meclizine 12.5 MG tablet Commonly known as:  ANTIVERT Take 1 tablet (12.5 mg total) by mouth 3 (three) times daily as needed for dizziness.   methocarbamol 500 MG tablet Commonly known as:  ROBAXIN Take 1 tablet (500 mg total) by mouth every 6 (six) hours as needed for muscle spasms.   multivitamin Tabs tablet Take 1 tablet by mouth at bedtime.   pantoprazole 40 MG tablet Commonly known as:  PROTONIX Take 1 tablet (40 mg total) by mouth daily.   polyethylene glycol packet Commonly known as:  MIRALAX / GLYCOLAX Take 17 g by mouth daily as needed for mild constipation.          TOTAL DISCHARGE TIME: 35 minutes  Osvaldo ShipperGokul Rebekah Sprinkle  [facility] Pager [phone]  09/21/2017, 11:30 AM

## 2017-09-21 NOTE — Clinical Social Work Note (Deleted)
CSW talked with Frances Reeves at bedside using video interpreting service with interpreter [phone]. Patient informed that she is ready for discharge and confirmed that [facility]. Patient also informed that she will be transported back to facility by ambulance.  Genelle BalVanessa Nashly Olsson, MSW, LCSW Licensed Clinical Social Worker Clinical Social Work Department [facility] [phone]

## 2017-09-21 NOTE — Progress Notes (Signed)
Entered in error [facility] Admission

## 2017-09-21 NOTE — Procedures (Signed)
I was present at this session.  I have reviewed the session itself and made appropriate changes.  HD via LUA AVF, unfortunately both needles up, close together, so recirc.  Need to lower vol.   Fayrene FearingJames Ailea Rhatigan 6/3/20198:05 AM

## 2017-09-21 NOTE — NC FL2 (Signed)
[place] MEDICAID FL2 LEVEL OF CARE SCREENING TOOL     IDENTIFICATION  Patient Name: RASHA IBE Birthdate: 1950/06/13 Sex: female Admission Date (Current Location): 09/19/2017  [place] and [place] Number:  [facility]nes Bast [id number] S Facility and Address:  [facility] [place], [address], [place], [place] [address]      Provider Number: [id number]  Attending Physician Name and Address:  Osvaldo Shipper, MD  Relative Name and Phone Number:  Raechel Chute; [phone]    Current Level of Care: Hospital Recommended Level of Care: Skilled Nursing Facility(From [facility]) Prior Approval Number:    Date Approved/Denied:   PASRR Number: [id number]  Discharge Plan: SNF    Current Diagnoses: Patient Active Problem List   Diagnosis Date Noted  . Fluid overload 09/19/2017  . Dizziness 09/19/2017  . Pericardial effusion 09/19/2017  . Lower urinary tract infectious disease 09/15/2017  . Unresponsiveness 09/15/2017  . Hypoglycemia 09/15/2017  . Hyponatremia 09/15/2017  . 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 Constipation due to opioid therapy 09/07/2017  . Malnutrition of moderate degree 09/01/2017  . Pubic ramus fracture, left, closed, initial encounter (HCC) 08/30/2017  . Acute on chronic diastolic CHF (congestive heart failure) (HCC) 08/30/2017  . Pubic ramus fracture (HCC) 08/30/2017  . Noncompliance of patient with renal dialysis (HCC) 05/13/2017  . Uncontrolled type 2 diabetes mellitus with ESRD (end-stage renal disease) (HCC) 02/07/2017  . GERD without esophagitis 02/07/2017  . Anemia 02/07/2017  . History of CVA (cerebrovascular accident) 02/07/2017  . Chest pressure 02/07/2017  . Shortness of breath 02/07/2017    Orientation RESPIRATION BLADDER Height & Weight     Self, Time, Situation, Place  Normal Continent Weight: 103 lb 9.9 oz (47 kg) Height:  4\' 10"  (147.3 cm)   BEHAVIORAL SYMPTOMS/MOOD NEUROLOGICAL BOWEL NUTRITION STATUS      Incontinent    AMBULATORY STATUS COMMUNICATION OF NEEDS Skin   Limited Assist Verbally Other (Comment)(Stage 2 pressure injury )                       Personal Care Assistance Level of Assistance  Bathing, Feeding, Dressing Bathing Assistance: Limited assistance Feeding assistance: Independent Dressing Assistance: Limited assistance     Functional Limitations Info  Sight, Hearing, Speech Sight Info: Adequate Hearing Info: Adequate Speech Info: Adequate(Spanish speaking)    SPECIAL CARE FACTORS FREQUENCY        PT Frequency: Evaluated 09/20/17 and a minimum of 3X therapy recommended during acute inpatient stay OT Frequency: Evaluated 6/2            Contractures Contractures Info: Not present    Additional Factors Info  Code Status Code Status Info: Full Allergies Info: No known allergies   Insulin Sliding Scale Info: 0-9 Units 3 times a day with meals; 0-5 Units per day at bedtime       Current Medications (09/21/2017):  This is the current hospital active medication list Current Facility-Administered Medications  Medication Dose Route Frequency Provider Last Rate Last Dose  . 0.9 %  sodium chloride infusion  100 mL Intravenous PRN Delano Metz, MD      . 0.9 %  sodium chloride infusion  250 mL Intravenous PRN Lorretta Harp, MD      . 0.9 %  sodium chloride infusion  100 mL Intravenous PRN Weston Settle, PA-C      . 0.9 %  sodium chloride infusion  100 mL Intravenous  PRN Weston Settle, PA-C      . acetaminophen (TYLENOL) 325 MG tablet           . acetaminophen (TYLENOL) tablet 650 mg  650 mg Oral Q6H PRN Lorretta Harp, MD   650 mg at 09/21/17 1003  . albuterol (PROVENTIL) (2.5 MG/3ML) 0.083% nebulizer solution 2.5 mg  2.5 mg Nebulization Q4H PRN Lorretta Harp, MD      . alteplase (CATHFLO ACTIVASE) injection 2 mg  2 mg Intracatheter Once PRN Weston Settle, PA-C      . aspirin EC tablet 81 mg  81 mg  Oral Daily Lorretta Harp, MD   81 mg at 09/20/17 1016  . calcium acetate (PHOSLO) capsule 667 mg  667 mg Oral TID WC Lorretta Harp, MD   667 mg at 09/20/17 1801  . Chlorhexidine Gluconate Cloth 2 % PADS 6 each  6 each Topical Q0600 Weston Settle, PA-C   6 each at 09/20/17 1019  . [START ON 09/22/2017] Darbepoetin Alfa (ARANESP) injection 40 mcg  40 mcg Intravenous Q Tue-HD Weston Settle, PA-C      . [START ON 09/22/2017] doxercalciferol (HECTOROL) injection 3 mcg  3 mcg Intravenous Q T,Th,Sa-HD Weston Settle, PA-C      . feeding supplement (NEPRO CARB STEADY) liquid 237 mL  237 mL Oral BID BM Lorretta Harp, MD   237 mL at 09/20/17 1606  . feeding supplement (PRO-STAT SUGAR FREE 64) liquid 30 mL  30 mL Oral BID Lorretta Harp, MD   30 mL at 09/20/17 2139  . [START ON 09/22/2017] ferric gluconate (NULECIT) 62.5 mg in sodium chloride 0.9 % 100 mL IVPB  62.5 mg Intravenous Q Tue-HD Weston Settle, PA-C      . heparin injection 1,000 Units  1,000 Units Dialysis PRN Weston Settle, PA-C      . heparin injection 5,000 Units  5,000 Units Subcutaneous Q8H Lorretta Harp, MD   5,000 Units at 09/20/17 2140  . heparin injection 900 Units  20 Units/kg Dialysis PRN Weston Settle, PA-C      . hydrALAZINE (APRESOLINE) injection 5 mg  5 mg Intravenous Q2H PRN Lorretta Harp, MD      . insulin aspart (novoLOG) injection 0-5 Units  0-5 Units Subcutaneous QHS Lorretta Harp, MD      . insulin aspart (novoLOG) injection 0-9 Units  0-9 Units Subcutaneous TID WC Lorretta Harp, MD   3 Units at 09/20/17 1208  . lidocaine (PF) (XYLOCAINE) 1 % injection 5 mL  5 mL Intradermal PRN Weston Settle, PA-C      . lidocaine-prilocaine (EMLA) cream 1 application  1 application Topical PRN Weston Settle, PA-C      . meclizine (ANTIVERT) tablet 12.5 mg  12.5 mg Oral TID PRN Lorretta Harp, MD      . methocarbamol (ROBAXIN) tablet 500 mg  500 mg Oral Q6H PRN Lorretta Harp, MD   500 mg at 09/20/17 2140  . morphine 4 MG/ML injection 1 mg  1 mg Intravenous Q4H PRN  Lorretta Harp, MD   1 mg at 09/20/17 0022  . multivitamin (RENA-VIT) tablet 1 tablet  1 tablet Oral QHS Lorretta Harp, MD   1 tablet at 09/20/17 2140  . nitroGLYCERIN (NITROSTAT) SL tablet 0.4 mg  0.4 mg Sublingual Q5 min PRN Lorretta Harp, MD      . ondansetron [facility]) injection 4 mg  4 mg Intravenous Q8H PRN Lorretta Harp, MD      . pantoprazole (PROTONIX) EC tablet 40 mg  40 mg Oral  Daily Lorretta HarpNiu, Xilin, MD   40 mg at 09/20/17 1016  . pentafluoroprop-tetrafluoroeth (GEBAUERS) aerosol 1 application  1 application Topical PRN Weston SettleBergman, Martha, PA-C      . polyethylene glycol (MIRALAX / GLYCOLAX) packet 17 g  17 g Oral Daily PRN Lorretta HarpNiu, Xilin, MD      . sodium chloride flush (NS) 0.9 % injection 3 mL  3 mL Intravenous Q12H Lorretta HarpNiu, Xilin, MD   3 mL at 09/20/17 2140  . sodium chloride flush (NS) 0.9 % injection 3 mL  3 mL Intravenous PRN Lorretta HarpNiu, Xilin, MD   3 mL at 09/20/17 1017  . zolpidem (AMBIEN) tablet 5 mg  5 mg Oral QHS PRN Lorretta HarpNiu, Xilin, MD   5 mg at 09/20/17 2140     Discharge Medications: Please see discharge summary for a list of discharge medications.  Relevant Imaging Results:  Relevant Lab Results:   Additional Information  ss# [id number]; HD TTS [address]   Okey DupreCrawford, Lazaro ArmsVanessa Bradley, [place]

## 2017-09-21 NOTE — Progress Notes (Signed)
Subjective: Interval History: has no complaint .  Objective: Vital signs in last 24 hours: Temp:  [98.2 F (36.8 C)-99 F (37.2 C)] 98.7 F (37.1 C) (06/03 0437) Pulse Rate:  [81-84] 82 (06/03 0437) Resp:  [15-18] 15 (06/03 0437) BP: (127-133)/(49-60) 130/60 (06/03 0437) SpO2:  [94 %-100 %] 98 % (06/03 0437) Weight:  [44.3 kg (97 lb 9.6 oz)-44.3 kg (97 lb 10.6 oz)] 44.3 kg (97 lb 9.6 oz) (06/03 0501) Weight change:   Intake/Output from previous day: 06/02 0701 - 06/03 0700 In: 720 [P.O.:720] Out: 400 [Urine:400] Intake/Output this shift: No intake/output data recorded.  General appearance: alert, cooperative and no distress Resp: rales bibasilar Cardio: S1, S2 normal and systolic murmur: systolic ejection 2/6, decrescendo at 2nd left intercostal space GI: soft, non-tender; bowel sounds normal; no masses,  no organomegaly Extremities: edema AVF LUA and bruit  Lab Results: Recent Labs    09/19/17 1340  WBC 5.4  HGB 10.8*  HCT 33.4*  PLT 328   BMET:  Recent Labs    09/20/17 0704 09/21/17 0629  NA 132* 130*  K 3.6 4.1  CL 89* 91*  CO2 31 30  GLUCOSE 92 128*  BUN 17 42*  CREATININE 2.63* 3.93*  CALCIUM 8.1* 8.1*   No results for input(s): PTH in the last 72 hours. Iron Studies: No results for input(s): IRON, TIBC, TRANSFERRIN, FERRITIN in the last 72 hours.  Studies/Results: Ct Angio Chest Pe W And/or Wo Contrast  Result Date: 09/19/2017 CLINICAL DATA:  67 year old with acute onset of shortness of breath and chest heaviness that began yesterday, associated with anxiety, nausea and vomiting. EXAM: CT ANGIOGRAPHY CHEST WITH CONTRAST TECHNIQUE: Multidetector CT imaging of the chest was performed using the standard protocol during bolus administration of intravenous contrast. Multiplanar CT image reconstructions and MIPs were obtained to evaluate the vascular anatomy. CONTRAST:  ISOVUE-370 IOPAMIDOL INJECTION 76% IV. COMPARISON:  No prior chest CT. Multiple prior  chest x-rays, including earlier same day. FINDINGS: Cardiovascular: Contrast opacification of pulmonary arteries is very good. Respiratory motion blurred many of the images. Overall, the study is of moderate to good diagnostic quality. No filling defects within either main pulmonary artery or their segmental branches in either lung to suggest pulmonary embolism. Heart moderately enlarged. Moderate to large pericardial effusion. No visible coronary atherosclerosis. Note is made of reflux of contrast into the intrahepatic IVC and the a patent veins. Moderate atherosclerosis involving the thoracic and proximal abdominal aorta without evidence of aneurysm. Mediastinum/Nodes: No pathologically enlarged mediastinal, hilar or axillary lymph nodes. No mediastinal masses. Normal-appearing esophagus. Normal-appearing thyroid gland. Lungs/Pleura: Ground-glass airspace opacities throughout both lungs, with evidence of interstitial edema. Large BILATERAL pleural effusions, RIGHT greater than LEFT, with associated passive atelectasis in the lower lobes. Fluid in the minor fissure on the RIGHT central airways patent with no significant bronchial wall thickening. Upper Abdomen: Visualized upper abdomen unremarkable for the early arterial phase of enhancement. Anatomic variant in that the LEFT lobe of the liver extends well across the midline into the LEFT UPPER quadrant. Musculoskeletal: Regional skeleton intact without acute or significant osseous abnormality. Review of the MIP images confirms the above findings. IMPRESSION: 1. No evidence of pulmonary embolism. 2. Mild CHF, with mild interstitial pulmonary edema. 3. Large BILATERAL pleural effusions, RIGHT greater than LEFT, with associated mild passive atelectasis in the lower lobes. 4. Moderate to large pericardial effusion. 5. Reflux of contrast into the IVC and hepatic veins likely indicates RIGHT heart failure and/or tricuspid valvular disease.  Aortic Atherosclerosis  (ICD10-170.0) Electronically Signed   By: Hulan Saashomas  Lawrence M.D.   On: 09/19/2017 18:26   Dg Chest Port 1 View  Result Date: 09/19/2017 CLINICAL DATA:  Chest pain EXAM: PORTABLE CHEST 1 VIEW COMPARISON:  09/15/2017 FINDINGS: Cardiac shadow remains enlarged. Aortic calcifications are again seen. The lungs are well aerated bilaterally without focal infiltrate some chronic changes in the left base are seen. No sizable effusion is noted. Nipple shadows are seen. Degenerative changes in the left shoulder joint are seen. IMPRESSION: Stable changes in the left base.  No acute abnormality noted. Electronically Signed   By: Alcide CleverMark  Lukens M.D.   On: 09/19/2017 14:48    I have reviewed the patient's current medications.  Assessment/Plan: 1 ESRD vol xs slowly lower.  2 anemia esa 3 DM  4 Pelvic fx. 5 CVA 6 UTIs P HD, esa. Will s/o , if decide to admit let us know.  LOS: 0 days   Fayrene FearingJames Cherene Dobbins 09/21/2017,8:06 AM

## 2017-09-22 ENCOUNTER — Non-Acute Institutional Stay (SKILLED_NURSING_FACILITY): Payer: Medicare Other | Admitting: Adult Health

## 2017-09-22 ENCOUNTER — Encounter: Payer: Self-pay | Admitting: Adult Health

## 2017-09-22 DIAGNOSIS — D631 Anemia in chronic kidney disease: Secondary | ICD-10-CM | POA: Diagnosis not present

## 2017-09-22 DIAGNOSIS — Z992 Dependence on renal dialysis: Secondary | ICD-10-CM

## 2017-09-22 DIAGNOSIS — N186 End stage renal disease: Secondary | ICD-10-CM | POA: Diagnosis not present

## 2017-09-22 DIAGNOSIS — E1165 Type 2 diabetes mellitus with hyperglycemia: Secondary | ICD-10-CM | POA: Diagnosis not present

## 2017-09-22 DIAGNOSIS — I5033 Acute on chronic diastolic (congestive) heart failure: Secondary | ICD-10-CM | POA: Diagnosis not present

## 2017-09-22 DIAGNOSIS — K219 Gastro-esophageal reflux disease without esophagitis: Secondary | ICD-10-CM

## 2017-09-22 DIAGNOSIS — E1122 Type 2 diabetes mellitus with diabetic chronic kidney disease: Secondary | ICD-10-CM

## 2017-09-22 DIAGNOSIS — R338 Other retention of urine: Secondary | ICD-10-CM

## 2017-09-22 DIAGNOSIS — IMO0002 Reserved for concepts with insufficient information to code with codable children: Secondary | ICD-10-CM

## 2017-09-22 LAB — HEPATITIS B SURFACE ANTIGEN: HEP B S AG: NEGATIVE

## 2017-09-22 NOTE — Progress Notes (Signed)
Location:   [facility] Room Number: 125 A Place of Service:  SNF (31)   CODE STATUS: Full Code  No Known Allergies  Chief Complaint  Patient presents with  . Hospitalization Follow-up    Hospital followp up    HPI:  She is a 67 year old short term resident of this facility being seen after being hospitalized for fluid overload; acute on chronic diastolic heart failure. She had recently been hospitalized for hypoglycemia. Her cbgs have been fairly well controlled in the hospital. She will need a fluid restriction to be given from dialysis. She is unable to participate in the hpi or ros. There are no reports of uncontrolled pain; change in appetite; chest pain or shortness of breath. There are no nursing concerns at this time. She will continue to be followed for her chronic illnesses including: diabetes; heart failure; gerd.   Past Medical History:  Diagnosis Date  . Anemia   . Chronic kidney disease   . Diabetes mellitus without complication ([place])   . GERD (gastroesophageal reflux disease)   . Stroke [facility])    in her 80's    Past Surgical History:  Procedure Laterality Date  . APPENDECTOMY    . BASCILIC VEIN TRANSPOSITION Left 11/24/2016   Procedure: FIRST STAGE BASCILIC VEIN TRANSPOSITION;  Surgeon: Waynetta Sandy, MD;  Location: [place];  Service: Vascular;  Laterality: Left;  . EYE SURGERY Bilateral    cataract surgery with lens implant  . INSERTION OF DIALYSIS CATHETER Right 11/24/2016   Procedure: INSERTION OF TUNNELED DIALYSIS CATHETER;  Surgeon: Waynetta Sandy, MD;  Location: [place];  Service: Vascular;  Laterality: Right;    Social History   Socioeconomic History  . Marital status: Widowed    Spouse name: Not on file  . Number of children: Not on file  . Years of education: Not on file  . Highest education level: Not on file  Occupational History  . Not on file  Social Needs  . Financial resource strain: Not on file  . Food  insecurity:    Worry: Not on file    Inability: Not on file  . Transportation needs:    Medical: Not on file    Non-medical: Not on file  Tobacco Use  . Smoking status: Never Smoker  . Smokeless tobacco: Never Used  Substance and Sexual Activity  . Alcohol use: No  . Drug use: No  . Sexual activity: Not on file  Lifestyle  . Physical activity:    Days per week: Not on file    Minutes per session: Not on file  . Stress: Not on file  Relationships  . Social connections:    Talks on phone: Not on file    Gets together: Not on file    Attends religious service: Not on file    Active member of club or organization: Not on file    Attends meetings of clubs or organizations: Not on file    Relationship status: Not on file  . Intimate partner violence:    Fear of current or ex partner: Not on file    Emotionally abused: Not on file    Physically abused: Not on file    Forced sexual activity: Not on file  Other Topics Concern  . Not on file  Social History Narrative  . Not on file   Family History  Problem Relation Age of Onset  . Diabetes Other   . CAD Neg Hx   .  Cancer Neg Hx   . Renal Disease Neg Hx       VITAL SIGNS BP 113/63   Pulse 71   Temp 98.2 F (36.8 C)   Resp 20   Ht 4' 10"  (1.473 m)   Wt 100 lb 4.8 oz (45.5 kg)   SpO2 99%   BMI 20.96 kg/m   Outpatient Encounter Medications as of 09/22/2017  Medication Sig  . acetaminophen (TYLENOL) 650 MG CR tablet Take 650 mg by mouth every 8 (eight) hours as needed for pain.  . Amino Acids-Protein Hydrolys (FEEDING SUPPLEMENT, PRO-STAT SUGAR FREE 64,) LIQD Take 30 mLs by mouth 2 (two) times daily.  Marland Kitchen aspirin EC 81 MG EC tablet Take 1 tablet (81 mg total) by mouth daily.  . calcium acetate (PHOSLO) 667 MG capsule Take 667 mg by mouth 3 (three) times daily with meals.  . Darbepoetin Alfa (ARANESP) 40 MCG/0.4ML SOSY injection Inject 0.4 mLs (40 mcg total) into the vein every Tuesday with hemodialysis.  Marland Kitchen  doxercalciferol (HECTOROL) 4 MCG/2ML injection Inject 1.5 mLs (3 mcg total) into the vein Every Tuesday,Thursday,and Saturday with dialysis.  . ferric gluconate 62.5 mg in sodium chloride 0.9 % 100 mL Inject 62.5 mg into the vein every Tuesday with hemodialysis.  Marland Kitchen insulin aspart (NOVOLOG FLEXPEN) 100 UNIT/ML FlexPen Inject 0-9 Units into the skin 3 (three) times daily with meals. CBG < 70: implement hypoglycemia protocol CBG 70 - 120: 0 units CBG 121 - 150: 1 unit CBG 151 - 200: 2 units CBG 201 - 250: 3 units CBG 251 - 300: 5 units CBG 301 - 350: 7 units CBG 351 - 400: 9 units CBG > 400: call MD.  . meclizine (ANTIVERT) 12.5 MG tablet Take 1 tablet (12.5 mg total) by mouth 3 (three) times daily as needed for dizziness.  . methocarbamol (ROBAXIN) 500 MG tablet Take 1 tablet (500 mg total) by mouth every 6 (six) hours as needed for muscle spasms.  . multivitamin (RENA-VIT) TABS tablet Take 1 tablet by mouth at bedtime.  . Nutritional Supplements (FEEDING SUPPLEMENT, NEPRO CARB STEADY,) LIQD Take 237 mLs by mouth 2 (two) times daily between meals.  . pantoprazole (PROTONIX) 40 MG tablet Take 1 tablet (40 mg total) by mouth daily.  . polyethylene glycol (MIRALAX / GLYCOLAX) packet Take 17 g by mouth daily as needed for mild constipation.   No facility-administered encounter medications on file as of 09/22/2017.      SIGNIFICANT DIAGNOSTIC EXAMS   PREVIOUS:  08-29-17 :bilateral hip and pelvis x-ray: Fractures through the superior and inferior pubic rami on the left.   08-29-17: lumbar spine x-ray: No acute abnormality in the lumbar spine.   08-30-17: chest x-ray: Stable cardiomegaly with aortic atherosclerosis. Chronic interstitial disease with subsegmental atelectasis and/or scarring within both lower lobes, left greater than right.  08-31-17: esophagram/ barium swallow: 1. Smooth, likely extrinsic narrowing of the esophagus adjacent to the enlarged heart. This seems pulsatile and I suspect  that the appearance is primarily due to the esophageal positioning between the enlarged heart and spine. Mild fold thickening of the esophagus in this vicinity suggests mild-to-moderate esophagitis. 2. Scattered tertiary contractions in the esophagus but primary peristaltic waves were preserved on 3/4 swallows. 3. Please note that due to patient frailty the typical protocol for esophagram could not be performed, and today's images were obtained with the patient laying down in the LPO position. This reduces the exams sensitivity and specificity compared to the normal comprehensive esophagram performed on  patients able to stand, turn, and follow complex swallowing instructions. 4. Atherosclerotic calcification of the aortic arch.   09-15-17: chest x-ray: Small bilateral pleural effusions noted. Left basilar airspace opacity is similar in appearance and may reflect atelectasis or scarring. Chronically increased interstitial markings noted.  09-15-17: ct of head: Normal aging brain without acute intracranial abnormality.  TODAY:   09-19-17: chest x-ray: Stable changes in the left base. No acute abnormality noted.   09-19-17: ct angio of chest: 1. No evidence of pulmonary embolism. 2. Mild CHF, with mild interstitial pulmonary edema. 3. Large BILATERAL pleural effusions, RIGHT greater than LEFT, with associated mild passive atelectasis in the lower lobes. 4. Moderate to large pericardial effusion. 5. Reflux of contrast into the IVC and hepatic veins likely indicates RIGHT heart failure and/or tricuspid valvular disease. Aortic Atherosclerosis   09-20-17: 2-d echo: - Left ventricle: The cavity size was normal. Wall thickness was increased in a pattern of mild LVH. Systolic function was normal. The estimated ejection fraction was in the range of 55% to 60%.  Wall motion was normal; there were no regional wall motion  abnormalities. Doppler parameters are consistent with abnormal left ventricular relaxation  (grade 1 diastolic dysfunction). Doppler parameters are consistent with high ventricular filling pressure. - Aortic valve: There was mild regurgitation. - Mitral valve: There was moderate eccentric regurgitation. - Left atrium: The atrium was severely dilated. - Tricuspid valve: There was mild regurgitation. - Pulmonary arteries: PA peak pressure: 31 mm Hg (S). - Pericardium, extracardiac: Moderate size pericardial effusion seen. There was a mild degree of right atrial inversion but no diastolic right-sided chamber collapse to suggest tamponade physiology.    LABS REVIEWED: PREVIOUS:   08-29-17: wbc 6.0; hgb 13.5; hct 40.5; mcv 92.3; plt 197; glucose 249; bun 65; creat 5.37; k+ 5.1; na++ 135; ca 8.4 08-30-17: albumin 3.2; vit D 29.3; hgb a1c 10.2 09-01-17: wbc 4.2; hgb 11.6; hct 35.5; mcv 92.9; plt 168; glucose 197; bun 54; creat 4.67; k+ 4.8; na++ 131; ca 7.4; phos 6.7; albumin 2.9 09-04-17: wbc 7.3; hgb 11.8; hct 36.4; mcv 91.0 ;plt 220; glucose 233; bun 66; creat 5.10 ;k+ 4.4; na++ 131; ca 8.1; phos 4.5; albumin 2.7  09-15-17: wbc 9.5; hgb 12.0; hct 37.3; mcv 92.1; plt 400; glucose 250; bun 62; creat 4.98; k+ 5.0; na++ 128; alk phos 133; albumin 2.9 hgb a1c 9.2 urine culture: klebsiella pneumoniae and enterococcus faecalis  09-16-17: tsh 3.273    TODAY;   09-19-17: wbc 5.4; hgb 10.8; hct 33.4; mcv 91.3; plt 328; glucose 152; bun 40; creat 3.98; k+ 4.4; na++ 141; ca 8.5; alk phos 135; albumin 2.9; urine culture: 50,000 enterococcus faecalis 09-20-17: chol 205; ldl 105; trig 116; hdl 77 09-21-17: wbc 5.3; hgb 9.7; hct 30.2; mcv 91.8; plt 315; glucose 129; bun 44; creat 3.99; k+ 4.1; na++ 130; ca 8.5; phos 4.7; albumin 2.8    Review of Systems  Unable to perform ROS: Language    Physical Exam  Constitutional: No distress.  Cachexia   Neck: No thyromegaly present.  Cardiovascular: Normal rate, regular rhythm and intact distal pulses.  Murmur heard. 1/6  Pulmonary/Chest: Effort normal and  breath sounds normal. No respiratory distress.  Abdominal: Soft. Bowel sounds are normal. She exhibits no distension. There is no tenderness.  Genitourinary:  Genitourinary Comments: Has foley   Musculoskeletal: She exhibits no edema.  Is able to move all extremities Is status post pelvic fracture   Lymphadenopathy:    She has no cervical adenopathy.  Neurological: She is alert.  Skin: Skin is warm and dry. She is not diaphoretic.  Psychiatric: She has a normal mood and affect.      ASSESSMENT/ PLAN:  TODAY:   1.  Chronic diastolic heart failure stable: EF 55-60% (09-20-17): will not make changes will monitor her status.   2. GERD without esophagitis: stable will continue protonix 40 mg daily   3. 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 Left closed pubic ramus fracture: without change: will follow up with orthopedics as indicated will continue therapy as directed; has  robaxin 500 mg every 6 hours as needed for spasms  5. 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 Will continue phoslo 667 mg three times daily is followed by nephrology   6. Constipation due to opioid therapy: is stable will continue miralax daily as needed  7. Acute urine retention: stable has foley; awaiting urology appointment  8. Anemia due to end stage renal disease: is stable will continue aranesp 40 mcg weekly at dialysis; and hectorol 3 mg three times weekly at dialysis and ferric gluconate 62.5 mg weekly at dialysis.         MD is aware of resident's narcotic use and is in agreement with current plan of care. We will attempt to wean resident as apropriate   Ok Edwards NP [facility]  Contact [phone] Monday  through Friday 8am- 5pm  After hours call [phone]

## 2017-09-24 ENCOUNTER — Encounter: Payer: Self-pay | Admitting: Internal Medicine

## 2017-09-24 ENCOUNTER — Non-Acute Institutional Stay (SKILLED_NURSING_FACILITY): Payer: Medicare Other | Admitting: Internal Medicine

## 2017-09-24 DIAGNOSIS — B373 Candidiasis of vulva and vagina: Secondary | ICD-10-CM | POA: Diagnosis not present

## 2017-09-24 DIAGNOSIS — I5032 Chronic diastolic (congestive) heart failure: Secondary | ICD-10-CM | POA: Diagnosis not present

## 2017-09-24 DIAGNOSIS — S32592S Other specified fracture of left pubis, sequela: Secondary | ICD-10-CM

## 2017-09-24 DIAGNOSIS — N186 End stage renal disease: Secondary | ICD-10-CM

## 2017-09-24 DIAGNOSIS — R338 Other retention of urine: Secondary | ICD-10-CM

## 2017-09-24 DIAGNOSIS — T402X5A Adverse effect of other opioids, initial encounter: Secondary | ICD-10-CM

## 2017-09-24 DIAGNOSIS — E1122 Type 2 diabetes mellitus with diabetic chronic kidney disease: Secondary | ICD-10-CM

## 2017-09-24 DIAGNOSIS — IMO0002 Reserved for concepts with insufficient information to code with codable children: Secondary | ICD-10-CM

## 2017-09-24 DIAGNOSIS — Z992 Dependence on renal dialysis: Secondary | ICD-10-CM

## 2017-09-24 DIAGNOSIS — B3731 Acute candidiasis of vulva and vagina: Secondary | ICD-10-CM

## 2017-09-24 DIAGNOSIS — K5903 Drug induced constipation: Secondary | ICD-10-CM

## 2017-09-24 DIAGNOSIS — E1165 Type 2 diabetes mellitus with hyperglycemia: Secondary | ICD-10-CM

## 2017-09-24 NOTE — Progress Notes (Signed)
Patient ID: Frances Reeves, female   DOB: 21-Oct-1950, 66 y.o.   MRN: [id number]  Provider:  DR Elmon Kirschner Location:  [facility] Room Number: 125 A Place of Service:  SNF (31)  PCP: Patient, No Pcp Per Patient Care Team: Patient, No Pcp Per as PCP - General (General Practice)  Extended Emergency Contact Information Primary Emergency Contact: Hoyle Sauer [place] Phone: [phone] Relation: Daughter Secondary Emergency Contact: Aleman,Carlos Address: [address]          [place], [place] [address] [place] Home Phone: [phone] Relation: Son  Code Status: Full Code Goals of Care: Advanced Directive information Advanced Directives 09/24/2017  Does Patient Have a Medical Advance Directive? No  Would patient like information on creating a medical advance directive? No - Patient declined      Chief Complaint  Patient presents with  . Readmit To SNF    Readmission    HPI: Patient is a 67 y.o. female seen today for re-admission to SNF following hospital stay for fluid overload, acute on chronic diastolic HF, pericardial effusion, ESRD/HD TThSa, dysphagia, DM, hx CVA, GERD. CT chest neg for PE but showed pericardial effusion. 2 D echo revealed EF 55-60% with mild LVH, mild AR, mod MR, severe dilatation of LA, moderate pericardila effusion but no tampanode. A1c 9.2%; albumin 2.9. She presents to SNF for short term rehab.   Today she reports c/a vaginal d/c and burning. CBGs 90-150s. No low BS reactions. Language barrier prevents further HPI. No nursing issues. No falls. Appetite reduced. She gets nutritional supplements per facility protocol.  Chronic diastolic heart failure stable - EF 55-60%; managed by HD   GERD - stable on protonix 40 mg daily   DM - uncontrolled. a1c 9.2% (previous 10.2%); she gets Novolog SSI: 121-150: 1 unit; 151-200: 2 units; 201-250: 3 units; 251-300: 5 units; 301-350: 7 units;  351-400: 9 units. She has ESRD and gets HD  Left closed pubic ramus fracture - unchanged; followed by Ortho. Takes robaxin 500 mg every 6 hours as needed for spasms  ESRD - on HD TThSa via left arm AVF. Followed by nephro; takes phoslo 667 mg three times daily is   Constipation - opioid induced. Takes miralax daily as needed  Acute urine retention likely related to pubic ramus fx - has foley cath; urology consult pending; she completed abx for UTI   Past Medical History:  Diagnosis Date  . Anemia   . Chronic kidney disease   . Diabetes mellitus without complication (HCC)   . GERD (gastroesophageal reflux disease)   . Stroke [facility])    in her 59's   Past Surgical History:  Procedure Laterality Date  . APPENDECTOMY    . BASCILIC VEIN TRANSPOSITION Left 11/24/2016   Procedure: FIRST STAGE BASCILIC VEIN TRANSPOSITION;  Surgeon: Maeola Harman, MD;  Location: [facility] OR;  Service: Vascular;  Laterality: Left;  . EYE SURGERY Bilateral    cataract surgery with lens implant  . INSERTION OF DIALYSIS CATHETER Right 11/24/2016   Procedure: INSERTION OF TUNNELED DIALYSIS CATHETER;  Surgeon: Maeola Harman, MD;  Location: [facility] OR;  Service: Vascular;  Laterality: Right;    reports that she has never smoked. She has never used smokeless tobacco. She reports that she does not drink alcohol or use drugs. Social History   Socioeconomic History  . Marital status: Widowed    Spouse name: Not on file  . Number of children: Not  on file  . Years of education: Not on file  . Highest education level: Not on file  Occupational History  . Not on file  Social Needs  . Financial resource strain: Not on file  . Food insecurity:    Worry: Not on file    Inability: Not on file  . Transportation needs:    Medical: Not on file    Non-medical: Not on file  Tobacco Use  . Smoking status: Never Smoker  . Smokeless tobacco: Never Used  Substance and Sexual Activity  . Alcohol use: No  .  Drug use: No  . Sexual activity: Not on file  Lifestyle  . Physical activity:    Days per week: Not on file    Minutes per session: Not on file  . Stress: Not on file  Relationships  . Social connections:    Talks on phone: Not on file    Gets together: Not on file    Attends religious service: Not on file    Active member of club or organization: Not on file    Attends meetings of clubs or organizations: Not on file    Relationship status: Not on file  . Intimate partner violence:    Fear of current or ex partner: Not on file    Emotionally abused: Not on file    Physically abused: Not on file    Forced sexual activity: Not on file  Other Topics Concern  . Not on file  Social History Narrative  . Not on file    Functional Status Survey:    Family History  Problem Relation Age of Onset  . Diabetes Other   . CAD Neg Hx   . Cancer Neg Hx   . Renal Disease Neg Hx     Health Maintenance  Topic Date Due  . FOOT EXAM  09/08/2018 (Originally 05/29/1960)  . MAMMOGRAM  09/08/2018 (Originally 05/29/1998)  . OPHTHALMOLOGY EXAM  09/08/2018 (Originally 05/29/1960)  . URINE MICROALBUMIN  09/08/2018 (Originally [birth year]/29/1958)  . DEXA SCAN  09/08/2018 (Originally 05/29/2013)  . COLONOSCOPY  09/08/2018 (Originally 05/29/1998)  . Hepatitis C Screening  09/08/2018 (Originally 03/29/1951)  . INFLUENZA VACCINE  11/19/2017  . HEMOGLOBIN A1C  03/18/2018  . PNA vac Low Risk Adult (2 of 2 - PCV13) 05/17/2018  . TETANUS/TDAP  Discontinued    No Known Allergies  Outpatient Encounter Medications as of 09/24/2017  Medication Sig  . acetaminophen (TYLENOL) 650 MG CR tablet Take 650 mg by mouth every 8 (eight) hours as needed for pain.  . Amino Acids-Protein Hydrolys (FEEDING SUPPLEMENT, PRO-STAT SUGAR FREE 64,) LIQD Take 30 mLs by mouth 2 (two) times daily.  Marland Kitchen. aspirin EC 81 MG EC tablet Take 1 tablet (81 mg total) by mouth daily.  . calcium acetate (PHOSLO) 667 MG capsule Take 667 mg by mouth 3 (three)  times daily with meals.  . Darbepoetin Alfa (ARANESP) 40 MCG/0.4ML SOSY injection Inject 0.4 mLs (40 mcg total) into the vein every Tuesday with hemodialysis.  Marland Kitchen. doxercalciferol (HECTOROL) 4 MCG/2ML injection Inject 1.5 mLs (3 mcg total) into the vein Every Tuesday,Thursday,and Saturday with dialysis.  . ferric gluconate 62.5 mg in sodium chloride 0.9 % 100 mL Inject 62.5 mg into the vein every Tuesday with hemodialysis.  Marland Kitchen. insulin aspart (NOVOLOG FLEXPEN) 100 UNIT/ML FlexPen Inject 0-9 Units into the skin 3 (three) times daily with meals. CBG < 70: implement hypoglycemia protocol CBG 70 - 120: 0 units CBG 121 - 150: 1 unit  CBG 151 - 200: 2 units CBG 201 - 250: 3 units CBG 251 - 300: 5 units CBG 301 - 350: 7 units CBG 351 - 400: 9 units CBG > 400: call MD.  . meclizine (ANTIVERT) 12.5 MG tablet Take 1 tablet (12.5 mg total) by mouth 3 (three) times daily as needed for dizziness.  . methocarbamol (ROBAXIN) 500 MG tablet Take 1 tablet (500 mg total) by mouth every 6 (six) hours as needed for muscle spasms.  . multivitamin (RENA-VIT) TABS tablet Take 1 tablet by mouth at bedtime.  . Nutritional Supplements (FEEDING SUPPLEMENT, NEPRO CARB STEADY,) LIQD Take 237 mLs by mouth 2 (two) times daily between meals.  . pantoprazole (PROTONIX) 40 MG tablet Take 1 tablet (40 mg total) by mouth daily.  . polyethylene glycol (MIRALAX / GLYCOLAX) packet Take 17 g by mouth daily as needed for mild constipation.   No facility-administered encounter medications on file as of 09/24/2017.     Review of Systems  Unable to perform ROS: Other (limited by language barrier)  Constitutional: Positive for appetite change.  Genitourinary: Positive for vaginal discharge.  Musculoskeletal: Positive for arthralgias and gait problem.    Vitals:   09/24/17 0914  BP: 131/63  Pulse: 71  Resp: 20  Temp: 98.2 F (36.8 C)  SpO2: 99%  Weight: 100 lb 4.8 oz (45.5 kg)  Height: 4\' 10"  (1.473 m)   Body mass index is 20.96  kg/m. Physical Exam  Constitutional: She is oriented to person, place, and time. She appears well-developed.  Frail appearing in NAD, sitting up in bed  HENT:  Mouth/Throat: Oropharynx is clear and moist. No oropharyngeal exudate.  MMM; no oral thrush  Eyes: Pupils are equal, round, and reactive to light. No scleral icterus.  Neck: Neck supple. Carotid bruit is not present. No tracheal deviation present. No thyromegaly present.  Cardiovascular: Normal rate, regular rhythm and intact distal pulses. Exam reveals friction rub (friction). Exam reveals no gallop.  Murmur (1/6 SEM) heard. Left arm AVF with palpable thrill/audible bruit; no LE edema b/l; no calf TTP  Pulmonary/Chest: Effort normal. No stridor. No respiratory distress. She has no wheezes. She has rales (right base).  Abdominal: Soft. Normal appearance and bowel sounds are normal. She exhibits no distension and no mass. There is no hepatomegaly. There is no tenderness. There is no rigidity, no rebound and no guarding. No hernia.  Genitourinary: There is rash (angry appearingat vulva; external vaginal area) on the right labia. There is rash (similar to right labia) on the left labia. Vaginal discharge found.  Genitourinary Comments: Whitish/brown d/c staining brief; no blood; foley DTG with clear yellow urine  Musculoskeletal: She exhibits edema.  Lymphadenopathy:    She has no cervical adenopathy.  Neurological: She is alert and oriented to person, place, and time. She has normal reflexes.  Skin: Skin is warm and dry. No rash noted.  Psychiatric: She has a normal mood and affect. Her behavior is normal. Judgment and thought content normal.    Labs reviewed: Basic Metabolic Panel: Recent Labs    05/17/17 0442  09/01/17 0726  09/04/17 0751  09/20/17 0704 09/21/17 0629 09/21/17 0842  NA 136   < > 131*   < > 130*   < > 132* 130* 130*  K 4.2   < > 4.8   < > 4.4   < > 3.6 4.1 4.1  CL 95*   < > 89*   < > 88*   < > 89* 91*  89*    CO2 26   < > 27   < > 27   < > 31 30 28   GLUCOSE 166*   < > 197*   < > 233*   < > 92 128* 129*  BUN 40*   < > 54*   < > 66*   < > 17 42* 44*  CREATININE 3.21*   < > 4.67*   < > 5.10*   < > 2.63* 3.93* 3.99*  CALCIUM 8.5*   < > 7.4*   < > 8.1*   < > 8.1* 8.1* 8.5*  MG 2.7*  --   --   --   --   --   --   --   --   PHOS 3.8  --  6.7*  --  4.5  --   --   --  4.7*   < > = values in this interval not displayed.   Liver Function Tests: Recent Labs    02/02/17 1611  09/15/17 0215 09/19/17 1340 09/21/17 0842  AST 24  --  23 21  --   ALT 25  --  14 13*  --   ALKPHOS 592*  --  133* 135*  --   BILITOT 0.3  --  0.4 0.6  --   PROT 7.4  --  6.9 6.6  --   ALBUMIN 2.5*   < > 2.9* 2.9* 2.8*   < > = values in this interval not displayed.   Recent Labs    09/20/17 0458  LIPASE 39   No results for input(s): AMMONIA in the last 8760 hours. CBC: Recent Labs    08/29/17 2240  09/15/17 0215 09/16/17 0411 09/19/17 1340 09/21/17 0842  WBC 6.0   < > 9.5 5.8 5.4 5.3  NEUTROABS 4.4  --  8.1*  --  3.8  --   HGB 13.5   < > 12.0 10.3* 10.8* 9.7*  HCT 40.5   < > 37.3 32.2* 33.4* 30.2*  MCV 92.3   < > 92.1 92.5 91.3 91.8  PLT 197   < > 400 336 328 315   < > = values in this interval not displayed.   Cardiac Enzymes: Recent Labs    02/07/17 1230 09/20/17 0458 09/20/17 0704  TROPONINI <0.03 0.06* 0.06*   BNP: Invalid input(s): POCBNP Lab Results  Component Value Date   HGBA1C 9.2 (H) 09/15/2017   Lab Results  Component Value Date   TSH 3.273 09/16/2017   No results found for: VITAMINB12 No results found for: FOLATE No results found for: IRON, TIBC, FERRITIN  Imaging and Procedures obtained prior to SNF admission: Ct Angio Chest Pe W And/or Wo Contrast  Result Date: 09/19/2017 CLINICAL DATA:  67 year old with acute onset of shortness of breath and chest heaviness that began yesterday, associated with anxiety, nausea and vomiting. EXAM: CT ANGIOGRAPHY CHEST WITH CONTRAST TECHNIQUE:  Multidetector CT imaging of the chest was performed using the standard protocol during bolus administration of intravenous contrast. Multiplanar CT image reconstructions and MIPs were obtained to evaluate the vascular anatomy. CONTRAST:  ISOVUE-370 IOPAMIDOL INJECTION 76% IV. COMPARISON:  No prior chest CT. Multiple prior chest x-rays, including earlier same day. FINDINGS: Cardiovascular: Contrast opacification of pulmonary arteries is very good. Respiratory motion blurred many of the images. Overall, the study is of moderate to good diagnostic quality. No filling defects within either main pulmonary artery or their segmental branches in either lung to suggest pulmonary embolism. Heart  moderately enlarged. Moderate to large pericardial effusion. No visible coronary atherosclerosis. Note is made of reflux of contrast into the intrahepatic IVC and the a patent veins. Moderate atherosclerosis involving the thoracic and proximal abdominal aorta without evidence of aneurysm. Mediastinum/Nodes: No pathologically enlarged mediastinal, hilar or axillary lymph nodes. No mediastinal masses. Normal-appearing esophagus. Normal-appearing thyroid gland. Lungs/Pleura: Ground-glass airspace opacities throughout both lungs, with evidence of interstitial edema. Large BILATERAL pleural effusions, RIGHT greater than LEFT, with associated passive atelectasis in the lower lobes. Fluid in the minor fissure on the RIGHT central airways patent with no significant bronchial wall thickening. Upper Abdomen: Visualized upper abdomen unremarkable for the early arterial phase of enhancement. Anatomic variant in that the LEFT lobe of the liver extends well across the midline into the LEFT UPPER quadrant. Musculoskeletal: Regional skeleton intact without acute or significant osseous abnormality. Review of the MIP images confirms the above findings. IMPRESSION: 1. No evidence of pulmonary embolism. 2. Mild CHF, with mild interstitial pulmonary  edema. 3. Large BILATERAL pleural effusions, RIGHT greater than LEFT, with associated mild passive atelectasis in the lower lobes. 4. Moderate to large pericardial effusion. 5. Reflux of contrast into the IVC and hepatic veins likely indicates RIGHT heart failure and/or tricuspid valvular disease. Aortic Atherosclerosis (ICD10-170.0) Electronically Signed   By: Hulan Saas M.D.   On: 09/19/2017 18:26   Dg Chest Port 1 View  Result Date: 09/19/2017 CLINICAL DATA:  Chest pain EXAM: PORTABLE CHEST 1 VIEW COMPARISON:  09/15/2017 FINDINGS: Cardiac shadow remains enlarged. Aortic calcifications are again seen. The lungs are well aerated bilaterally without focal infiltrate some chronic changes in the left base are seen. No sizable effusion is noted. Nipple shadows are seen. Degenerative changes in the left shoulder joint are seen. IMPRESSION: Stable changes in the left base.  No acute abnormality noted. Electronically Signed   By: Alcide Clever M.D.   On: 09/19/2017 14:48    Assessment/Plan   ICD-10-CM   1. Yeast vaginitis B37.3   2. 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 Uncontrolled type 2 diabetes mellitus with ESRD (end-stage renal disease) (HCC) E11.22    N18.6    E11.65   4. Closed fracture of ramus of left pubis, sequela S32.592S   5. Chronic diastolic CHF (congestive heart failure) (HCC) I50.32   6. Acute retention of urine R33.8   7. Constipation due to opioid therapy K59.03    T40.2X5A      START DIFLUCAN 150 MG PO X 1   Cont other meds as ordered  PT/OT/ST as ordered  F/u with HD as scheduled  Foley cath care as indicated  F/u with specialists as scheduled  GOAL: short term rehab and d/c home when medically appropriate. Communicated with pt and nursing.  Will follow  Labs/tests ordered: none    Demitri Kucinski S. Ancil Linsey  [facility] [address] [place], [place] [address] [phone] Cell (Monday-Friday 8 AM - 5 PM) [phone] After 5 PM and follow prompts

## 2017-09-30 ENCOUNTER — Encounter: Payer: Self-pay | Admitting: Adult Health

## 2017-09-30 ENCOUNTER — Non-Acute Institutional Stay (SKILLED_NURSING_FACILITY): Payer: Medicare Other | Admitting: Adult Health

## 2017-09-30 DIAGNOSIS — I5032 Chronic diastolic (congestive) heart failure: Secondary | ICD-10-CM

## 2017-09-30 DIAGNOSIS — K219 Gastro-esophageal reflux disease without esophagitis: Secondary | ICD-10-CM | POA: Diagnosis not present

## 2017-09-30 DIAGNOSIS — E1165 Type 2 diabetes mellitus with hyperglycemia: Secondary | ICD-10-CM

## 2017-09-30 DIAGNOSIS — S32592S Other specified fracture of left pubis, sequela: Secondary | ICD-10-CM | POA: Diagnosis not present

## 2017-09-30 DIAGNOSIS — E1122 Type 2 diabetes mellitus with diabetic chronic kidney disease: Secondary | ICD-10-CM

## 2017-09-30 DIAGNOSIS — IMO0002 Reserved for concepts with insufficient information to code with codable children: Secondary | ICD-10-CM

## 2017-09-30 DIAGNOSIS — N186 End stage renal disease: Secondary | ICD-10-CM | POA: Diagnosis not present

## 2017-09-30 NOTE — Progress Notes (Addendum)
Location:   [facility] Room Number: 125 A Place of Service:  SNF (31)   CODE STATUS: Full Code  No Known Allergies  Chief Complaint  Patient presents with  . Medical Management of Chronic Issues    Chf; gerd; diabetes; pelvic fracture. Weekly follow up for the first 30 days post hospitalization. Care plan meeting.     HPI:  She is a 67 year old short term rehab patient being seen for the management of her chronic illnesses: chf; gerd; diabetes; pelvic fracture. She continues to be seen by therapy. Her urology appointment is pending for her urine retention. Therapy states that she will more than likely require 24 hour supervision upon discharge. She is unable to fully participate in the hpi or ros. There are no reports of pain; no anxiety; no change in appetite. There are no nursing concerns at this time.   Past Medical History:  Diagnosis Date  . Anemia   . Chronic kidney disease   . Diabetes mellitus without complication ([place])   . GERD (gastroesophageal reflux disease)   . Stroke [facility])    in her 74's    Past Surgical History:  Procedure Laterality Date  . APPENDECTOMY    . BASCILIC VEIN TRANSPOSITION Left 11/24/2016   Procedure: FIRST STAGE BASCILIC VEIN TRANSPOSITION;  Surgeon: Waynetta Sandy, MD;  Location: [place];  Service: Vascular;  Laterality: Left;  . EYE SURGERY Bilateral    cataract surgery with lens implant  . INSERTION OF DIALYSIS CATHETER Right 11/24/2016   Procedure: INSERTION OF TUNNELED DIALYSIS CATHETER;  Surgeon: Waynetta Sandy, MD;  Location: [place];  Service: Vascular;  Laterality: Right;    Social History   Socioeconomic History  . Marital status: Widowed    Spouse name: Not on file  . Number of children: Not on file  . Years of education: Not on file  . Highest education level: Not on file  Occupational History  . Not on file  Social Needs  . Financial resource strain: Not on file  . Food insecurity:   Worry: Not on file    Inability: Not on file  . Transportation needs:    Medical: Not on file    Non-medical: Not on file  Tobacco Use  . Smoking status: Never Smoker  . Smokeless tobacco: Never Used  Substance and Sexual Activity  . Alcohol use: No  . Drug use: No  . Sexual activity: Not on file  Lifestyle  . Physical activity:    Days per week: Not on file    Minutes per session: Not on file  . Stress: Not on file  Relationships  . Social connections:    Talks on phone: Not on file    Gets together: Not on file    Attends religious service: Not on file    Active member of club or organization: Not on file    Attends meetings of clubs or organizations: Not on file    Relationship status: Not on file  . Intimate partner violence:    Fear of current or ex partner: Not on file    Emotionally abused: Not on file    Physically abused: Not on file    Forced sexual activity: Not on file  Other Topics Concern  . Not on file  Social History Narrative  . Not on file   Family History  Problem Relation Age of Onset  . Diabetes Other   . CAD Neg Hx   .  Cancer Neg Hx   . Renal Disease Neg Hx       VITAL SIGNS BP (!) 146/65   Pulse 89   Temp 97.6 F (36.4 C)   Resp 20   Ht 4' 10"  (1.473 m)   Wt 97 lb 12.8 oz (44.4 kg)   SpO2 97%   BMI 20.44 kg/m   Outpatient Encounter Medications as of 09/30/2017  Medication Sig  . acetaminophen (TYLENOL) 650 MG CR tablet Take 650 mg by mouth every 8 (eight) hours as needed for pain.  . Amino Acids-Protein Hydrolys (FEEDING SUPPLEMENT, PRO-STAT SUGAR FREE 64,) LIQD Take 30 mLs by mouth 2 (two) times daily.  Marland Kitchen aspirin EC 81 MG EC tablet Take 1 tablet (81 mg total) by mouth daily.  . calcium acetate (PHOSLO) 667 MG capsule Take 667 mg by mouth 3 (three) times daily with meals.  . Darbepoetin Alfa (ARANESP) 40 MCG/0.4ML SOSY injection Inject 0.4 mLs (40 mcg total) into the vein every Tuesday with hemodialysis.  Marland Kitchen doxercalciferol  (HECTOROL) 4 MCG/2ML injection Inject 1.5 mLs (3 mcg total) into the vein Every Tuesday,Thursday,and Saturday with dialysis.  . ferric gluconate 62.5 mg in sodium chloride 0.9 % 100 mL Inject 62.5 mg into the vein every Tuesday with hemodialysis.  Marland Kitchen insulin aspart (NOVOLOG FLEXPEN) 100 UNIT/ML FlexPen Inject 0-9 Units into the skin 3 (three) times daily with meals. CBG < 70: implement hypoglycemia protocol CBG 70 - 120: 0 units CBG 121 - 150: 1 unit CBG 151 - 200: 2 units CBG 201 - 250: 3 units CBG 251 - 300: 5 units CBG 301 - 350: 7 units CBG 351 - 400: 9 units CBG > 400: call MD.  . meclizine (ANTIVERT) 12.5 MG tablet Take 1 tablet (12.5 mg total) by mouth 3 (three) times daily as needed for dizziness.  . methocarbamol (ROBAXIN) 500 MG tablet Take 1 tablet (500 mg total) by mouth every 6 (six) hours as needed for muscle spasms.  . multivitamin (RENA-VIT) TABS tablet Take 1 tablet by mouth at bedtime.  . Nutritional Supplements (FEEDING SUPPLEMENT, NEPRO CARB STEADY,) LIQD Take 237 mLs by mouth 2 (two) times daily between meals.  . pantoprazole (PROTONIX) 40 MG tablet Take 1 tablet (40 mg total) by mouth daily.  . polyethylene glycol (MIRALAX / GLYCOLAX) packet Take 17 g by mouth daily as needed for mild constipation.   No facility-administered encounter medications on file as of 09/30/2017.      SIGNIFICANT DIAGNOSTIC EXAMS  PREVIOUS:  08-29-17 :bilateral hip and pelvis x-ray: Fractures through the superior and inferior pubic rami on the left.   08-29-17: lumbar spine x-ray: No acute abnormality in the lumbar spine.   08-30-17: chest x-ray: Stable cardiomegaly with aortic atherosclerosis. Chronic interstitial disease with subsegmental atelectasis and/or scarring within both lower lobes, left greater than right.  08-31-17: esophagram/ barium swallow: 1. Smooth, likely extrinsic narrowing of the esophagus adjacent to the enlarged heart. This seems pulsatile and I suspect that the  appearance is primarily due to the esophageal positioning between the enlarged heart and spine. Mild fold thickening of the esophagus in this vicinity suggests mild-to-moderate esophagitis. 2. Scattered tertiary contractions in the esophagus but primary peristaltic waves were preserved on 3/4 swallows. 3. Please note that due to patient frailty the typical protocol for esophagram could not be performed, and today's images were obtained with the patient laying down in the LPO position. This reduces the exams sensitivity and specificity compared to the normal comprehensive esophagram performed on  patients able to stand, turn, and follow complex swallowing instructions. 4. Atherosclerotic calcification of the aortic arch.   09-15-17: chest x-ray: Small bilateral pleural effusions noted. Left basilar airspace opacity is similar in appearance and may reflect atelectasis or scarring. Chronically increased interstitial markings noted.  09-15-17: ct of head: Normal aging brain without acute intracranial abnormality.  09-19-17: chest x-ray: Stable changes in the left base. No acute abnormality noted.   09-19-17: ct angio of chest: 1. No evidence of pulmonary embolism. 2. Mild CHF, with mild interstitial pulmonary edema. 3. Large BILATERAL pleural effusions, RIGHT greater than LEFT, with associated mild passive atelectasis in the lower lobes. 4. Moderate to large pericardial effusion. 5. Reflux of contrast into the IVC and hepatic veins likely indicates RIGHT heart failure and/or tricuspid valvular disease. Aortic Atherosclerosis   09-20-17: 2-d echo: - Left ventricle: The cavity size was normal. Wall thickness was increased in a pattern of mild LVH. Systolic function was normal. The estimated ejection fraction was in the range of 55% to 60%.  Wall motion was normal; there were no regional wall motion  abnormalities. Doppler parameters are consistent with abnormal left ventricular relaxation (grade 1 diastolic  dysfunction). Doppler parameters are consistent with high ventricular filling pressure. - Aortic valve: There was mild regurgitation. - Mitral valve: There was moderate eccentric regurgitation. - Left atrium: The atrium was severely dilated. - Tricuspid valve: There was mild regurgitation. - Pulmonary arteries: PA peak pressure: 31 mm Hg (S). - Pericardium, extracardiac: Moderate size pericardial effusion seen. There was a mild degree of right atrial inversion but no diastolic right-sided chamber collapse to suggest tamponade physiology.    NO NEW EXAMS.   LABS REVIEWED: PREVIOUS:   08-29-17: wbc 6.0; hgb 13.5; hct 40.5; mcv 92.3; plt 197; glucose 249; bun 65; creat 5.37; k+ 5.1; na++ 135; ca 8.4 08-30-17: albumin 3.2; vit D 29.3; hgb a1c 10.2 09-01-17: wbc 4.2; hgb 11.6; hct 35.5; mcv 92.9; plt 168; glucose 197; bun 54; creat 4.67; k+ 4.8; na++ 131; ca 7.4; phos 6.7; albumin 2.9 09-04-17: wbc 7.3; hgb 11.8; hct 36.4; mcv 91.0 ;plt 220; glucose 233; bun 66; creat 5.10 ;k+ 4.4; na++ 131; ca 8.1; phos 4.5; albumin 2.7  09-15-17: wbc 9.5; hgb 12.0; hct 37.3; mcv 92.1; plt 400; glucose 250; bun 62; creat 4.98; k+ 5.0; na++ 128; alk phos 133; albumin 2.9 hgb a1c 9.2 urine culture: klebsiella pneumoniae and enterococcus faecalis  09-16-17: tsh 3.273  09-19-17: wbc 5.4; hgb 10.8; hct 33.4; mcv 91.3; plt 328; glucose 152; bun 40; creat 3.98; k+ 4.4; na++ 141; ca 8.5; alk phos 135; albumin 2.9; urine culture: 50,000 enterococcus faecalis 09-20-17: chol 205; ldl 105; trig 116; hdl 77 09-21-17: wbc 5.3; hgb 9.7; hct 30.2; mcv 91.8; plt 315; glucose 129; bun 44; creat 3.99; k+ 4.1; na++ 130; ca 8.5; phos 4.7; albumin 2.8   NO NEW LABS.    Review of Systems  Unable to perform ROS: Language    Physical Exam  Constitutional: No distress.  Cachexia   Neck: No thyromegaly present.  Cardiovascular: Normal rate, regular rhythm and intact distal pulses.  Murmur heard. 1/6  Pulmonary/Chest: Effort normal and  breath sounds normal. No respiratory distress.  Abdominal: Soft. Bowel sounds are normal. She exhibits no distension. There is no tenderness.  Genitourinary:  Genitourinary Comments: Foley   Musculoskeletal: She exhibits no edema.  Is able to move all extremities Is status post pelvic fracture    Lymphadenopathy:    She has no  cervical adenopathy.  Neurological: She is alert.  Skin: Skin is warm and dry. She is not diaphoretic.  Psychiatric: She has a normal mood and affect.    ASSESSMENT/ PLAN:  TODAY:   1.  Chronic diastolic heart failure stable: EF 55-60% (09-20-17): will not make changes will monitor her status.   2. GERD without esophagitis: stable will continue protonix 40 mg daily   3. 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  LDL 105  4. Left closed pubic ramus fracture: without change: will follow up with orthopedics as indicated will continue therapy as directed; has  robaxin 500 mg every 6 hours as needed for spasms  PREVIOUS   5. 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 Will continue phoslo 667 mg three times daily is followed by nephrology   6. Constipation due to opioid therapy: is stable will continue miralax daily as needed  7. Acute urine retention: stable has foley; awaiting urology appointment  8. Anemia due to end stage renal disease: is stable will continue aranesp 40 mcg weekly at dialysis; and hectorol 3 mg three times weekly at dialysis and ferric gluconate 62.5 mg weekly at dialysis.     MD is aware of resident's narcotic use and is in agreement with current plan of care. We will attempt to wean resident as apropriate   Ok Edwards NP [facility]  Contact [phone] Monday through Friday 8am- 5pm  After hours call [phone]

## 2017-10-06 ENCOUNTER — Non-Acute Institutional Stay (SKILLED_NURSING_FACILITY): Payer: Medicare Other | Admitting: Adult Health

## 2017-10-06 ENCOUNTER — Encounter: Payer: Self-pay | Admitting: Adult Health

## 2017-10-06 DIAGNOSIS — R338 Other retention of urine: Secondary | ICD-10-CM

## 2017-10-06 DIAGNOSIS — Z992 Dependence on renal dialysis: Secondary | ICD-10-CM | POA: Diagnosis not present

## 2017-10-06 DIAGNOSIS — E1122 Type 2 diabetes mellitus with diabetic chronic kidney disease: Secondary | ICD-10-CM

## 2017-10-06 DIAGNOSIS — D631 Anemia in chronic kidney disease: Secondary | ICD-10-CM

## 2017-10-06 DIAGNOSIS — N186 End stage renal disease: Secondary | ICD-10-CM | POA: Diagnosis not present

## 2017-10-06 NOTE — Progress Notes (Signed)
Location:   [facility] Room Number: 125 A Place of Service:  SNF (31)   CODE STATUS: Full Code  No Known Allergies  Chief Complaint  Patient presents with  . Medical Management of Chronic Issues    Urine retention; anemia; esrd. Weekly follow up for the first 30 days post hospitalization     HPI:  She is a 67 year old short term resident of this facility being seen for the management of her chronic illnesses: urine retention; anemia esrd. She is unable to fully participate in the hpi or ros. There are no reports of uncontrolled pain; no change in appetite; no anxiety or insomnia. There are no nursing concerns at this time. Her goal remains for her to return back home.   Past Medical History:  Diagnosis Date  . Anemia   . Chronic kidney disease   . Diabetes mellitus without complication ([place])   . GERD (gastroesophageal reflux disease)   . Stroke [facility])    in her 80's    Past Surgical History:  Procedure Laterality Date  . APPENDECTOMY    . BASCILIC VEIN TRANSPOSITION Left 11/24/2016   Procedure: FIRST STAGE BASCILIC VEIN TRANSPOSITION;  Surgeon: Waynetta Sandy, MD;  Location: [place];  Service: Vascular;  Laterality: Left;  . EYE SURGERY Bilateral    cataract surgery with lens implant  . INSERTION OF DIALYSIS CATHETER Right 11/24/2016   Procedure: INSERTION OF TUNNELED DIALYSIS CATHETER;  Surgeon: Waynetta Sandy, MD;  Location: [place];  Service: Vascular;  Laterality: Right;    Social History   Socioeconomic History  . Marital status: Widowed    Spouse name: Not on file  . Number of children: Not on file  . Years of education: Not on file  . Highest education level: Not on file  Occupational History  . Not on file  Social Needs  . Financial resource strain: Not on file  . Food insecurity:    Worry: Not on file    Inability: Not on file  . Transportation needs:    Medical: Not on file    Non-medical: Not on file  Tobacco Use  .  Smoking status: Never Smoker  . Smokeless tobacco: Never Used  Substance and Sexual Activity  . Alcohol use: No  . Drug use: No  . Sexual activity: Not on file  Lifestyle  . Physical activity:    Days per week: Not on file    Minutes per session: Not on file  . Stress: Not on file  Relationships  . Social connections:    Talks on phone: Not on file    Gets together: Not on file    Attends religious service: Not on file    Active member of club or organization: Not on file    Attends meetings of clubs or organizations: Not on file    Relationship status: Not on file  . Intimate partner violence:    Fear of current or ex partner: Not on file    Emotionally abused: Not on file    Physically abused: Not on file    Forced sexual activity: Not on file  Other Topics Concern  . Not on file  Social History Narrative  . Not on file   Family History  Problem Relation Age of Onset  . Diabetes Other   . CAD Neg Hx   . Cancer Neg Hx   . Renal Disease Neg Hx       VITAL SIGNS  BP (!) 142/69   Pulse 83   Temp (!) 96.8 F (36 C)   Resp 18   Ht _0  (1.473 m)   Wt 97 lb 12.8 oz (44.4 kg)   SpO2 98%   BMI 20.44 kg/m   Outpatient Encounter Medications as of 10/06/2017  Medication Sig  . acetaminophen (TYLENOL) 650 MG CR tablet Take 650 mg by mouth every 8 (eight) hours as needed for pain.  . Amino Acids-Protein Hydrolys (FEEDING SUPPLEMENT, PRO-STAT SUGAR FREE 64,) LIQD Take 30 mLs by mouth 2 (two) times daily.  Marland Kitchen aspirin EC 81 MG EC tablet Take 1 tablet (81 mg total) by mouth daily.  . calcium acetate (PHOSLO) 667 MG capsule Take 667 mg by mouth 3 (three) times daily with meals.  . Darbepoetin Alfa (ARANESP) 40 MCG/0.4ML SOSY injection Inject 0.4 mLs (40 mcg total) into the vein every Tuesday with hemodialysis.  Marland Kitchen doxercalciferol (HECTOROL) 4 MCG/2ML injection Inject 1.5 mLs (3 mcg total) into the vein Every Tuesday,Thursday,and Saturday with dialysis.  . ferric gluconate 62.5  mg in sodium chloride 0.9 % 100 mL Inject 62.5 mg into the vein every Tuesday with hemodialysis.  Marland Kitchen insulin aspart (NOVOLOG FLEXPEN) 100 UNIT/ML FlexPen Inject 4 Units into the skin 3 (three) times daily after meals.  . Insulin Glargine (LANTUS SOLOSTAR) 100 UNIT/ML Solostar Pen Inject 5 Units into the skin at bedtime.  . meclizine (ANTIVERT) 12.5 MG tablet Take 1 tablet (12.5 mg total) by mouth 3 (three) times daily as needed for dizziness.  . methocarbamol (ROBAXIN) 500 MG tablet Take 1 tablet (500 mg total) by mouth every 6 (six) hours as needed for muscle spasms.  . multivitamin (RENA-VIT) TABS tablet Take 1 tablet by mouth at bedtime.  . Nutritional Supplements (FEEDING SUPPLEMENT, NEPRO CARB STEADY,) LIQD Take 237 mLs by mouth 2 (two) times daily between meals.  . pantoprazole (PROTONIX) 40 MG tablet Take 1 tablet (40 mg total) by mouth daily.  . polyethylene glycol (MIRALAX / GLYCOLAX) packet Take 17 g by mouth daily as needed for mild constipation.  Marland Kitchen UNABLE TO FIND 1500 cc Fluid Restriction - Nursing to give 800 on 7-3, 500 on 3-11 and 200 on 11-7.  . [DISCONTINUED] insulin aspart (NOVOLOG FLEXPEN) 100 UNIT/ML FlexPen Inject 0-9 Units into the skin 3 (three) times daily with meals. CBG < 70: implement hypoglycemia protocol CBG 70 - 120: 0 units CBG 121 - 150: 1 unit CBG 151 - 200: 2 units CBG 201 - 250: 3 units CBG 251 - 300: 5 units CBG 301 - 350: 7 units CBG 351 - 400: 9 units CBG > 400: call MD. (Patient not taking: Reported on 10/06/2017)   No facility-administered encounter medications on file as of 10/06/2017.      SIGNIFICANT DIAGNOSTIC EXAMS   PREVIOUS:  08-29-17 :bilateral hip and pelvis x-ray: Fractures through the superior and inferior pubic rami on the left.   08-29-17: lumbar spine x-ray: No acute abnormality in the lumbar spine.   08-30-17: chest x-ray: Stable cardiomegaly with aortic atherosclerosis. Chronic interstitial disease with subsegmental atelectasis and/or  scarring within both lower lobes, left greater than right.  08-31-17: esophagram/ barium swallow: 1. Smooth, likely extrinsic narrowing of the esophagus adjacent to the enlarged heart. This seems pulsatile and I suspect that the appearance is primarily due to the esophageal positioning between the enlarged heart and spine. Mild fold thickening of the esophagus in this vicinity suggests mild-to-moderate esophagitis. 2. Scattered tertiary contractions in the esophagus but primary  peristaltic waves were preserved on 3/4 swallows. 3. Please note that due to patient frailty the typical protocol for esophagram could not be performed, and today's images were obtained with the patient laying down in the LPO position. This reduces the exams sensitivity and specificity compared to the normal comprehensive esophagram performed on patients able to stand, turn, and follow complex swallowing instructions. 4. Atherosclerotic calcification of the aortic arch.   09-15-17: chest x-ray: Small bilateral pleural effusions noted. Left basilar airspace opacity is similar in appearance and may reflect atelectasis or scarring. Chronically increased interstitial markings noted.  09-15-17: ct of head: Normal aging brain without acute intracranial abnormality.  09-19-17: chest x-ray: Stable changes in the left base. No acute abnormality noted.   09-19-17: ct angio of chest: 1. No evidence of pulmonary embolism. 2. Mild CHF, with mild interstitial pulmonary edema. 3. Large BILATERAL pleural effusions, RIGHT greater than LEFT, with associated mild passive atelectasis in the lower lobes. 4. Moderate to large pericardial effusion. 5. Reflux of contrast into the IVC and hepatic veins likely indicates RIGHT heart failure and/or tricuspid valvular disease. Aortic Atherosclerosis   09-20-17: 2-d echo: - Left ventricle: The cavity size was normal. Wall thickness was increased in a pattern of mild LVH. Systolic function was normal. The  estimated ejection fraction was in the range of 55% to 60%.  Wall motion was normal; there were no regional wall motion  abnormalities. Doppler parameters are consistent with abnormal left ventricular relaxation (grade 1 diastolic dysfunction). Doppler parameters are consistent with high ventricular filling pressure. - Aortic valve: There was mild regurgitation. - Mitral valve: There was moderate eccentric regurgitation. - Left atrium: The atrium was severely dilated. - Tricuspid valve: There was mild regurgitation. - Pulmonary arteries: PA peak pressure: 31 mm Hg (S). - Pericardium, extracardiac: Moderate size pericardial effusion seen. There was a mild degree of right atrial inversion but no diastolic right-sided chamber collapse to suggest tamponade physiology.    NO NEW EXAMS.   LABS REVIEWED: PREVIOUS:   08-29-17: wbc 6.0; hgb 13.5; hct 40.5; mcv 92.3; plt 197; glucose 249; bun 65; creat 5.37; k+ 5.1; na++ 135; ca 8.4 08-30-17: albumin 3.2; vit D 29.3; hgb a1c 10.2 09-01-17: wbc 4.2; hgb 11.6; hct 35.5; mcv 92.9; plt 168; glucose 197; bun 54; creat 4.67; k+ 4.8; na++ 131; ca 7.4; phos 6.7; albumin 2.9 09-04-17: wbc 7.3; hgb 11.8; hct 36.4; mcv 91.0 ;plt 220; glucose 233; bun 66; creat 5.10 ;k+ 4.4; na++ 131; ca 8.1; phos 4.5; albumin 2.7  09-15-17: wbc 9.5; hgb 12.0; hct 37.3; mcv 92.1; plt 400; glucose 250; bun 62; creat 4.98; k+ 5.0; na++ 128; alk phos 133; albumin 2.9 hgb a1c 9.2 urine culture: klebsiella pneumoniae and enterococcus faecalis  09-16-17: tsh 3.273  09-19-17: wbc 5.4; hgb 10.8; hct 33.4; mcv 91.3; plt 328; glucose 152; bun 40; creat 3.98; k+ 4.4; na++ 141; ca 8.5; alk phos 135; albumin 2.9; urine culture: 50,000 enterococcus faecalis 09-20-17: chol 205; ldl 105; trig 116; hdl 77 09-21-17: wbc 5.3; hgb 9.7; hct 30.2; mcv 91.8; plt 315; glucose 129; bun 44; creat 3.99; k+ 4.1; na++ 130; ca 8.5; phos 4.7; albumin 2.8   NO NEW LABS.    Review of Systems  Unable to perform ROS:  Language    Physical Exam  Constitutional: No distress.  Cachexia   Neck: No thyromegaly present.  Cardiovascular: Normal rate, regular rhythm and intact distal pulses.  Murmur heard. 1/6  Pulmonary/Chest: Effort normal and breath sounds normal.  No respiratory distress.  Abdominal: Soft. Bowel sounds are normal. She exhibits no distension. There is no tenderness.  Musculoskeletal: She exhibits no edema.  Is able to move all extremities Is status post pelvic fracture     Lymphadenopathy:    She has no cervical adenopathy.  Neurological: She is alert.  Skin: Skin is warm and dry. She is not diaphoretic.  Left upper extremity A/V fistula: + thril + bruit   Psychiatric: She has a normal mood and affect.     ASSESSMENT/ PLAN:  TODAY:   1. 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 Will continue phoslo 667 mg three times daily is followed by nephrology   2. Constipation due to opioid therapy: is stable will continue miralax daily as needed  3. Acute urine retention: stable has foley; awaiting urology appointment  PREVIOUS   4. Anemia due to end stage renal disease: is stable will continue aranesp 40 mcg weekly at dialysis; and hectorol 3 mg three times weekly at dialysis and ferric gluconate 62.5 mg weekly at dialysis.   5.  Chronic diastolic heart failure stable: EF 55-60% (09-20-17): will not make changes will monitor her status.   6. GERD without esophagitis: stable will continue protonix 40 mg daily   7. 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  LDL 105  8. Left closed pubic ramus fracture: without change: will follow up with orthopedics as indicated will continue therapy as directed; has  robaxin 500 mg every 6 hours as needed for spasms        MD is aware of resident's  narcotic use and is in agreement with current plan of care. We will attempt to wean resident as apropriate   Ok Edwards NP [facility]  Contact [phone] Monday through Friday 8am- 5pm  After hours call [phone]

## 2017-10-08 DIAGNOSIS — N186 End stage renal disease: Secondary | ICD-10-CM

## 2017-10-08 DIAGNOSIS — D631 Anemia in chronic kidney disease: Secondary | ICD-10-CM | POA: Insufficient documentation

## 2017-10-13 ENCOUNTER — Non-Acute Institutional Stay (SKILLED_NURSING_FACILITY): Payer: Medicare Other | Admitting: Adult Health

## 2017-10-13 ENCOUNTER — Encounter: Payer: Self-pay | Admitting: Adult Health

## 2017-10-13 DIAGNOSIS — N186 End stage renal disease: Secondary | ICD-10-CM | POA: Diagnosis not present

## 2017-10-13 DIAGNOSIS — S32592S Other specified fracture of left pubis, sequela: Secondary | ICD-10-CM | POA: Diagnosis not present

## 2017-10-13 DIAGNOSIS — E1165 Type 2 diabetes mellitus with hyperglycemia: Secondary | ICD-10-CM | POA: Diagnosis not present

## 2017-10-13 DIAGNOSIS — E1122 Type 2 diabetes mellitus with diabetic chronic kidney disease: Secondary | ICD-10-CM | POA: Diagnosis not present

## 2017-10-13 DIAGNOSIS — Z992 Dependence on renal dialysis: Secondary | ICD-10-CM | POA: Diagnosis not present

## 2017-10-13 DIAGNOSIS — IMO0002 Reserved for concepts with insufficient information to code with codable children: Secondary | ICD-10-CM

## 2017-10-13 NOTE — Progress Notes (Signed)
Location:   [facility] Room Number: 125 A Place of Service:  SNF (31)    CODE STATUS: Full Code  No Known Allergies  Chief Complaint  Patient presents with  . Discharge Note    Discharging    HPI:  She is being discharged to home with home health for pt/ot/rn/[place]/cna/sw. She will need a standard wheelchair and 3:1 commode. She does have a great deal of family support who can provider her with the supervision she needs. She will need her prescriptions to be written and will need to follow up with her medical provider.  She had been hospitalized for a pelvic fracture: she was admitted to this facility for short term rehab.     Past Medical History:  Diagnosis Date  . Anemia   . Chronic kidney disease   . Diabetes mellitus without complication ([place])   . GERD (gastroesophageal reflux disease)   . Stroke [facility])    in her 56's    Past Surgical History:  Procedure Laterality Date  . APPENDECTOMY    . BASCILIC VEIN TRANSPOSITION Left 11/24/2016   Procedure: FIRST STAGE BASCILIC VEIN TRANSPOSITION;  Surgeon: Waynetta Sandy, MD;  Location: [place];  Service: Vascular;  Laterality: Left;  . EYE SURGERY Bilateral    cataract surgery with lens implant  . INSERTION OF DIALYSIS CATHETER Right 11/24/2016   Procedure: INSERTION OF TUNNELED DIALYSIS CATHETER;  Surgeon: Waynetta Sandy, MD;  Location: [place];  Service: Vascular;  Laterality: Right;    Social History   Socioeconomic History  . Marital status: Widowed    Spouse name: Not on file  . Number of children: Not on file  . Years of education: Not on file  . Highest education level: Not on file  Occupational History  . Not on file  Social Needs  . Financial resource strain: Not on file  . Food insecurity:    Worry: Not on file    Inability: Not on file  . Transportation needs:    Medical: Not on file    Non-medical: Not on file  Tobacco Use  . Smoking status: Never Smoker  . Smokeless  tobacco: Never Used  Substance and Sexual Activity  . Alcohol use: No  . Drug use: No  . Sexual activity: Not on file  Lifestyle  . Physical activity:    Days per week: Not on file    Minutes per session: Not on file  . Stress: Not on file  Relationships  . Social connections:    Talks on phone: Not on file    Gets together: Not on file    Attends religious service: Not on file    Active member of club or organization: Not on file    Attends meetings of clubs or organizations: Not on file    Relationship status: Not on file  . Intimate partner violence:    Fear of current or ex partner: Not on file    Emotionally abused: Not on file    Physically abused: Not on file    Forced sexual activity: Not on file  Other Topics Concern  . Not on file  Social History Narrative  . Not on file   Family History  Problem Relation Age of Onset  . Diabetes Other   . CAD Neg Hx   . Cancer Neg Hx   . Renal Disease Neg Hx     VITAL SIGNS BP (!) 145/70   Pulse 93  Temp 98.2 F (36.8 C)   Resp 18   Ht 4' 10"  (1.473 m)   Wt 98 lb (44.5 kg)   SpO2 97%   BMI 20.48 kg/m   Patient's Medications  New Prescriptions   No medications on file  Previous Medications   ACETAMINOPHEN (TYLENOL) 650 MG CR TABLET    Take 650 mg by mouth every 8 (eight) hours as needed for pain.   AMINO ACIDS-PROTEIN HYDROLYS (FEEDING SUPPLEMENT, PRO-STAT SUGAR FREE 64,) LIQD    Take 30 mLs by mouth 2 (two) times daily.   ASPIRIN EC 81 MG EC TABLET    Take 1 tablet (81 mg total) by mouth daily.   CALCIUM ACETATE (PHOSLO) 667 MG CAPSULE    Take 667 mg by mouth 3 (three) times daily with meals.   DARBEPOETIN ALFA (ARANESP) 40 MCG/0.4ML SOSY INJECTION    Inject 0.4 mLs (40 mcg total) into the vein every Tuesday with hemodialysis.   DOXERCALCIFEROL (HECTOROL) 4 MCG/2ML INJECTION    Inject 1.5 mLs (3 mcg total) into the vein Every Tuesday,Thursday,and Saturday with dialysis.   FERRIC GLUCONATE 62.5 MG IN SODIUM  CHLORIDE 0.9 % 100 ML    Inject 62.5 mg into the vein every Tuesday with hemodialysis.   INSULIN ASPART (NOVOLOG FLEXPEN) 100 UNIT/ML FLEXPEN    Inject 4 Units into the skin 3 (three) times daily after meals.   INSULIN GLARGINE (LANTUS SOLOSTAR) 100 UNIT/ML SOLOSTAR PEN    Inject 5 Units into the skin at bedtime.   MECLIZINE (ANTIVERT) 12.5 MG TABLET    Take 1 tablet (12.5 mg total) by mouth 3 (three) times daily as needed for dizziness.   METHOCARBAMOL (ROBAXIN) 500 MG TABLET    Take 1 tablet (500 mg total) by mouth every 6 (six) hours as needed for muscle spasms.   MULTIVITAMIN (RENA-VIT) TABS TABLET    Take 1 tablet by mouth at bedtime.   NUTRITIONAL SUPPLEMENTS (FEEDING SUPPLEMENT, NEPRO CARB STEADY,) LIQD    Take 237 mLs by mouth 2 (two) times daily between meals.   PANTOPRAZOLE (PROTONIX) 40 MG TABLET    Take 1 tablet (40 mg total) by mouth daily.   POLYETHYLENE GLYCOL (MIRALAX / GLYCOLAX) PACKET    Take 17 g by mouth daily as needed for mild constipation.   UNABLE TO FIND    1500 cc Fluid Restriction - Nursing to give 800 on 7-3, 500 on 3-11 and 200 on 11-7.  Modified Medications   No medications on file  Discontinued Medications   No medications on file     SIGNIFICANT DIAGNOSTIC EXAMS  PREVIOUS:  08-29-17 :bilateral hip and pelvis x-ray: Fractures through the superior and inferior pubic rami on the left.   08-29-17: lumbar spine x-ray: No acute abnormality in the lumbar spine.   08-30-17: chest x-ray: Stable cardiomegaly with aortic atherosclerosis. Chronic interstitial disease with subsegmental atelectasis and/or scarring within both lower lobes, left greater than right.  08-31-17: esophagram/ barium swallow: 1. Smooth, likely extrinsic narrowing of the esophagus adjacent to the enlarged heart. This seems pulsatile and I suspect that the appearance is primarily due to the esophageal positioning between the enlarged heart and spine. Mild fold thickening of the esophagus in this  vicinity suggests mild-to-moderate esophagitis. 2. Scattered tertiary contractions in the esophagus but primary peristaltic waves were preserved on 3/4 swallows. 3. Please note that due to patient frailty the typical protocol for esophagram could not be performed, and today's images were obtained with the patient laying down in  the LPO position. This reduces the exams sensitivity and specificity compared to the normal comprehensive esophagram performed on patients able to stand, turn, and follow complex swallowing instructions. 4. Atherosclerotic calcification of the aortic arch.   09-15-17: chest x-ray: Small bilateral pleural effusions noted. Left basilar airspace opacity is similar in appearance and may reflect atelectasis or scarring. Chronically increased interstitial markings noted.  09-15-17: ct of head: Normal aging brain without acute intracranial abnormality.  09-19-17: chest x-ray: Stable changes in the left base. No acute abnormality noted.   09-19-17: ct angio of chest: 1. No evidence of pulmonary embolism. 2. Mild CHF, with mild interstitial pulmonary edema. 3. Large BILATERAL pleural effusions, RIGHT greater than LEFT, with associated mild passive atelectasis in the lower lobes. 4. Moderate to large pericardial effusion. 5. Reflux of contrast into the IVC and hepatic veins likely indicates RIGHT heart failure and/or tricuspid valvular disease. Aortic Atherosclerosis   09-20-17: 2-d echo: - Left ventricle: The cavity size was normal. Wall thickness was increased in a pattern of mild LVH. Systolic function was normal. The estimated ejection fraction was in the range of 55% to 60%.  Wall motion was normal; there were no regional wall motion  abnormalities. Doppler parameters are consistent with abnormal left ventricular relaxation (grade 1 diastolic dysfunction). Doppler parameters are consistent with high ventricular filling pressure. - Aortic valve: There was mild regurgitation. - Mitral  valve: There was moderate eccentric regurgitation. - Left atrium: The atrium was severely dilated. - Tricuspid valve: There was mild regurgitation. - Pulmonary arteries: PA peak pressure: 31 mm Hg (S). - Pericardium, extracardiac: Moderate size pericardial effusion seen. There was a mild degree of right atrial inversion but no diastolic right-sided chamber collapse to suggest tamponade physiology.    NO NEW EXAMS.   LABS REVIEWED: PREVIOUS:   08-29-17: wbc 6.0; hgb 13.5; hct 40.5; mcv 92.3; plt 197; glucose 249; bun 65; creat 5.37; k+ 5.1; na++ 135; ca 8.4 08-30-17: albumin 3.2; vit D 29.3; hgb a1c 10.2 09-01-17: wbc 4.2; hgb 11.6; hct 35.5; mcv 92.9; plt 168; glucose 197; bun 54; creat 4.67; k+ 4.8; na++ 131; ca 7.4; phos 6.7; albumin 2.9 09-04-17: wbc 7.3; hgb 11.8; hct 36.4; mcv 91.0 ;plt 220; glucose 233; bun 66; creat 5.10 ;k+ 4.4; na++ 131; ca 8.1; phos 4.5; albumin 2.7  09-15-17: wbc 9.5; hgb 12.0; hct 37.3; mcv 92.1; plt 400; glucose 250; bun 62; creat 4.98; k+ 5.0; na++ 128; alk phos 133; albumin 2.9 hgb a1c 9.2 urine culture: klebsiella pneumoniae and enterococcus faecalis  09-16-17: tsh 3.273  09-19-17: wbc 5.4; hgb 10.8; hct 33.4; mcv 91.3; plt 328; glucose 152; bun 40; creat 3.98; k+ 4.4; na++ 141; ca 8.5; alk phos 135; albumin 2.9; urine culture: 50,000 enterococcus faecalis 09-20-17: chol 205; ldl 105; trig 116; hdl 77 09-21-17: wbc 5.3; hgb 9.7; hct 30.2; mcv 91.8; plt 315; glucose 129; bun 44; creat 3.99; k+ 4.1; na++ 130; ca 8.5; phos 4.7; albumin 2.8   NO NEW LABS.   Review of Systems  Unable to perform ROS: Language     Physical Exam  Constitutional: No distress.  Cachexia   Neck: No thyromegaly present.  Cardiovascular: Normal rate, regular rhythm and intact distal pulses.  Murmur heard. 1/6  Pulmonary/Chest: Effort normal and breath sounds normal. No respiratory distress.  Abdominal: Soft. Bowel sounds are normal. She exhibits no distension. There is no tenderness.    Musculoskeletal: Normal range of motion. She exhibits no edema.  Lymphadenopathy:    She  has no cervical adenopathy.  Neurological: She is alert.  Skin: Skin is warm and dry. She is not diaphoretic.  Left upper extremity A/V fistula: + thril + bruit   Psychiatric: She has a normal mood and affect.    ASSESSMENT/ PLAN:   Patient is being discharged with the following home health services:  Pt/ot/rn/cna/sw:to evaluate and treat as indicated for gait balance strength; adl training; medication management; adl care and community resources. [place] care  Patient is being discharged with the following durable medical equipment:  3:1 commode   Standard wheelchair with elevated leg rests; cushion; anti-tippers; brake extensions to allow her to maintain her current level of independence with her adls which cannot be achieved with a walker; can self propel.   Patient has been advised to f/u with their PCP in 1-2 weeks to bring them up to date on their rehab stay.  Social services at facility was responsible for arranging this appointment.  Pt was provided with a 30 day supply of prescriptions for medications and refills must be obtained from their PCP.  For controlled substances, a more limited supply may be provided adequate until PCP appointment only.  A 30 day supply of her prescription medications have been written per the list above   Time spent with patient and family: 40 minutes: discussed home health needs and expectations; dme needs; medications; verbalized understanding.    Ok Edwards NP [facility] [place] [facility]  Contact [phone] Monday through Friday 8am- 5pm  After hours call [phone]

## 2017-10-30 ENCOUNTER — Encounter (HOSPITAL_COMMUNITY): Payer: Self-pay

## 2017-10-30 ENCOUNTER — Inpatient Hospital Stay (HOSPITAL_COMMUNITY): Payer: Medicare Other

## 2017-10-30 ENCOUNTER — Emergency Department (HOSPITAL_COMMUNITY): Payer: Medicare Other

## 2017-10-30 ENCOUNTER — Inpatient Hospital Stay (HOSPITAL_COMMUNITY)
Admission: EM | Admit: 2017-10-30 | Discharge: 2017-11-19 | DRG: 391 | Disposition: E | Payer: Medicare Other | Attending: Family Medicine | Admitting: Family Medicine

## 2017-10-30 ENCOUNTER — Other Ambulatory Visit: Payer: Self-pay

## 2017-10-30 DIAGNOSIS — N390 Urinary tract infection, site not specified: Secondary | ICD-10-CM | POA: Diagnosis not present

## 2017-10-30 DIAGNOSIS — R1013 Epigastric pain: Secondary | ICD-10-CM | POA: Diagnosis present

## 2017-10-30 DIAGNOSIS — D638 Anemia in other chronic diseases classified elsewhere: Secondary | ICD-10-CM

## 2017-10-30 DIAGNOSIS — Z7982 Long term (current) use of aspirin: Secondary | ICD-10-CM

## 2017-10-30 DIAGNOSIS — R339 Retention of urine, unspecified: Secondary | ICD-10-CM | POA: Diagnosis not present

## 2017-10-30 DIAGNOSIS — R10811 Right upper quadrant abdominal tenderness: Secondary | ICD-10-CM

## 2017-10-30 DIAGNOSIS — Z681 Body mass index (BMI) 19 or less, adult: Secondary | ICD-10-CM | POA: Diagnosis not present

## 2017-10-30 DIAGNOSIS — I469 Cardiac arrest, cause unspecified: Secondary | ICD-10-CM | POA: Diagnosis present

## 2017-10-30 DIAGNOSIS — R112 Nausea with vomiting, unspecified: Secondary | ICD-10-CM | POA: Diagnosis present

## 2017-10-30 DIAGNOSIS — D631 Anemia in chronic kidney disease: Secondary | ICD-10-CM | POA: Diagnosis not present

## 2017-10-30 DIAGNOSIS — E46 Unspecified protein-calorie malnutrition: Secondary | ICD-10-CM

## 2017-10-30 DIAGNOSIS — R1310 Dysphagia, unspecified: Secondary | ICD-10-CM | POA: Diagnosis present

## 2017-10-30 DIAGNOSIS — Z794 Long term (current) use of insulin: Secondary | ICD-10-CM

## 2017-10-30 DIAGNOSIS — Z87898 Personal history of other specified conditions: Secondary | ICD-10-CM

## 2017-10-30 DIAGNOSIS — E872 Acidosis: Secondary | ICD-10-CM | POA: Diagnosis present

## 2017-10-30 DIAGNOSIS — Z8673 Personal history of transient ischemic attack (TIA), and cerebral infarction without residual deficits: Secondary | ICD-10-CM

## 2017-10-30 DIAGNOSIS — R52 Pain, unspecified: Secondary | ICD-10-CM

## 2017-10-30 DIAGNOSIS — K559 Vascular disorder of intestine, unspecified: Secondary | ICD-10-CM | POA: Diagnosis present

## 2017-10-30 DIAGNOSIS — E1122 Type 2 diabetes mellitus with diabetic chronic kidney disease: Secondary | ICD-10-CM

## 2017-10-30 DIAGNOSIS — R64 Cachexia: Secondary | ICD-10-CM | POA: Diagnosis present

## 2017-10-30 DIAGNOSIS — N186 End stage renal disease: Secondary | ICD-10-CM | POA: Diagnosis present

## 2017-10-30 DIAGNOSIS — Z833 Family history of diabetes mellitus: Secondary | ICD-10-CM

## 2017-10-30 DIAGNOSIS — K219 Gastro-esophageal reflux disease without esophagitis: Secondary | ICD-10-CM | POA: Diagnosis present

## 2017-10-30 DIAGNOSIS — K529 Noninfective gastroenteritis and colitis, unspecified: Secondary | ICD-10-CM | POA: Diagnosis not present

## 2017-10-30 DIAGNOSIS — R531 Weakness: Secondary | ICD-10-CM

## 2017-10-30 DIAGNOSIS — E860 Dehydration: Secondary | ICD-10-CM | POA: Diagnosis not present

## 2017-10-30 DIAGNOSIS — I5032 Chronic diastolic (congestive) heart failure: Secondary | ICD-10-CM | POA: Diagnosis not present

## 2017-10-30 DIAGNOSIS — R110 Nausea: Secondary | ICD-10-CM

## 2017-10-30 DIAGNOSIS — R4189 Other symptoms and signs involving cognitive functions and awareness: Secondary | ICD-10-CM | POA: Diagnosis present

## 2017-10-30 DIAGNOSIS — Z992 Dependence on renal dialysis: Secondary | ICD-10-CM

## 2017-10-30 DIAGNOSIS — R092 Respiratory arrest: Secondary | ICD-10-CM | POA: Diagnosis present

## 2017-10-30 LAB — GLUCOSE, CAPILLARY: GLUCOSE-CAPILLARY: 201 mg/dL — AB (ref 70–99)

## 2017-10-30 LAB — COMPREHENSIVE METABOLIC PANEL
ALBUMIN: 3.5 g/dL (ref 3.5–5.0)
ALT: 15 U/L (ref 0–44)
ANION GAP: 25 — AB (ref 5–15)
AST: 27 U/L (ref 15–41)
Alkaline Phosphatase: 120 U/L (ref 38–126)
BILIRUBIN TOTAL: 0.7 mg/dL (ref 0.3–1.2)
BUN: 47 mg/dL — AB (ref 8–23)
CHLORIDE: 92 mmol/L — AB (ref 98–111)
CO2: 21 mmol/L — ABNORMAL LOW (ref 22–32)
Calcium: 10.3 mg/dL (ref 8.9–10.3)
Creatinine, Ser: 4.77 mg/dL — ABNORMAL HIGH (ref 0.44–1.00)
GFR calc Af Amer: 10 mL/min — ABNORMAL LOW (ref >60)
GFR, EST NON AFRICAN AMERICAN: 8 mL/min — AB (ref >60)
Glucose, Bld: 116 mg/dL — ABNORMAL HIGH (ref 70–99)
POTASSIUM: 5.1 mmol/L (ref 3.5–5.1)
Sodium: 138 mmol/L (ref 135–145)
Total Protein: 7.1 g/dL (ref 6.5–8.1)

## 2017-10-30 LAB — LIPASE, BLOOD: LIPASE: 59 U/L — AB (ref 11–51)

## 2017-10-30 LAB — CBC WITH DIFFERENTIAL/PLATELET
Abs Immature Granulocytes: 0 10*3/uL (ref 0.0–0.1)
BASOS PCT: 1 %
Basophils Absolute: 0 10*3/uL (ref 0.0–0.1)
EOS ABS: 0.1 10*3/uL (ref 0.0–0.7)
EOS PCT: 2 %
HCT: 37.3 % (ref 36.0–46.0)
Hemoglobin: 11.2 g/dL — ABNORMAL LOW (ref 12.0–15.0)
IMMATURE GRANULOCYTES: 0 %
Lymphocytes Relative: 19 %
Lymphs Abs: 0.9 10*3/uL (ref 0.7–4.0)
MCH: 29.6 pg (ref 26.0–34.0)
MCHC: 30 g/dL (ref 30.0–36.0)
MCV: 98.4 fL (ref 78.0–100.0)
Monocytes Absolute: 0.5 10*3/uL (ref 0.1–1.0)
Monocytes Relative: 10 %
NEUTROS PCT: 68 %
Neutro Abs: 3.2 10*3/uL (ref 1.7–7.7)
PLATELETS: 344 10*3/uL (ref 150–400)
RBC: 3.79 MIL/uL — AB (ref 3.87–5.11)
RDW: 15.7 % — AB (ref 11.5–15.5)
WBC: 4.7 10*3/uL (ref 4.0–10.5)

## 2017-10-30 LAB — URINALYSIS, ROUTINE W REFLEX MICROSCOPIC
BILIRUBIN URINE: NEGATIVE
Glucose, UA: NEGATIVE mg/dL
KETONES UR: NEGATIVE mg/dL
NITRITE: NEGATIVE
Specific Gravity, Urine: 1.01 (ref 1.005–1.030)
WBC, UA: >50 WBC/hpf — ABNORMAL HIGH (ref 0–5)
pH: 7 (ref 5.0–8.0)

## 2017-10-30 LAB — CBG MONITORING, ED: Glucose-Capillary: 109 mg/dL — ABNORMAL HIGH (ref 70–99)

## 2017-10-30 LAB — C DIFFICILE QUICK SCREEN W PCR REFLEX
C Diff antigen: NEGATIVE
C Diff interpretation: NOT DETECTED
C Diff toxin: NEGATIVE

## 2017-10-30 LAB — I-STAT TROPONIN, ED: Troponin i, poc: 0.01 ng/mL (ref 0.00–0.08)

## 2017-10-30 MED ORDER — HEPARIN SODIUM (PORCINE) 5000 UNIT/ML IJ SOLN
5000.0000 [IU] | Freq: Three times a day (TID) | INTRAMUSCULAR | Status: DC
  Administered 2017-10-30 – 2017-10-31 (×2): 5000 [IU] via SUBCUTANEOUS
  Filled 2017-10-30: qty 1

## 2017-10-30 MED ORDER — PRO-STAT SUGAR FREE PO LIQD
30.0000 mL | Freq: Two times a day (BID) | ORAL | Status: DC
  Administered 2017-10-30: 30 mL via ORAL
  Filled 2017-10-30: qty 30

## 2017-10-30 MED ORDER — INSULIN ASPART 100 UNIT/ML ~~LOC~~ SOLN
0.0000 [IU] | Freq: Three times a day (TID) | SUBCUTANEOUS | Status: DC
  Administered 2017-10-31: 1 [IU] via SUBCUTANEOUS

## 2017-10-30 MED ORDER — SODIUM CHLORIDE 0.9 % IV SOLN
INTRAVENOUS | Status: AC
  Administered 2017-10-30: 19:00:00 via INTRAVENOUS

## 2017-10-30 MED ORDER — ONDANSETRON HCL 4 MG/2ML IJ SOLN
4.0000 mg | Freq: Four times a day (QID) | INTRAMUSCULAR | Status: DC | PRN
  Administered 2017-10-30 – 2017-10-31 (×2): 4 mg via INTRAVENOUS
  Filled 2017-10-30 (×2): qty 2

## 2017-10-30 MED ORDER — NEPRO/CARBSTEADY PO LIQD
237.0000 mL | Freq: Two times a day (BID) | ORAL | Status: DC
  Filled 2017-10-30 (×2): qty 237

## 2017-10-30 MED ORDER — ACETAMINOPHEN 325 MG PO TABS
650.0000 mg | ORAL_TABLET | Freq: Three times a day (TID) | ORAL | Status: DC | PRN
  Administered 2017-10-31 (×2): 650 mg via ORAL
  Filled 2017-10-30 (×2): qty 2

## 2017-10-30 MED ORDER — DEXTROSE 50 % IV SOLN
INTRAVENOUS | Status: AC
  Administered 2017-10-30: 50 mL
  Filled 2017-10-30: qty 50

## 2017-10-30 MED ORDER — SODIUM CHLORIDE 0.9 % IV SOLN
1.0000 g | Freq: Once | INTRAVENOUS | Status: AC
  Administered 2017-10-30: 1 g via INTRAVENOUS
  Filled 2017-10-30: qty 10

## 2017-10-30 MED ORDER — IOPAMIDOL (ISOVUE-300) INJECTION 61%
INTRAVENOUS | Status: AC
  Filled 2017-10-30: qty 30

## 2017-10-30 MED ORDER — SODIUM CHLORIDE 0.9% FLUSH: 3.0000 mL | Freq: Two times a day (BID) | INTRAVENOUS | Status: DC

## 2017-10-30 MED ORDER — INSULIN ASPART 100 UNIT/ML ~~LOC~~ SOLN: 0.0000 [IU] | Freq: Every day | SUBCUTANEOUS | Status: DC

## 2017-10-30 NOTE — ED Notes (Signed)
Attempted to call report x 1  

## 2017-10-30 NOTE — ED Triage Notes (Signed)
Pt brought in by [facility] from home for NVD x2 weeks. Pt d/c'd home from a x4 week rehab stay when symptoms started. Per EMS and family pt was not eating during her stay in rehab- pt stated she did not like the food. Pt continues to not eat at home. Pt now feels weak, unable to stand independently. Physical therapy went to see pt this am, noted she was unable to walk to restroom alone without almost falling- wanted pt evaluated. Pt is Spanish speaking only. Pt A+Ox4 and in NAD on arrival.

## 2017-10-30 NOTE — H&P (Signed)
History and Physical   Frances Reeves ZOX:[id number] DOB: 05/07/1950 DOA: 12/18/17  Referring MD/NP/PA: Leonia Coronaortni Couture, PA, EDP PCP: Patient, No Pcp Per  Patient coming from: Home  Chief Complaint: Nausea vomiting diarrhea  HPI: Frances Reeves is a 67 y.o. female with a history of ESRD, chronic diastolic CHF, recurrent UTI, and recent SNF admission for pubic ramus fracture who presented to the ED for 3 days of nausea, vomiting, and diarrhea described as constant, waxing/waning, worsening now severe, with >10 watery stools per day. No medications tried, but did take usual medications today. Has tried to eat but vomits almost immediately once taking anything by mouth. Has developed associate epigastric pain which is better when she rubs the area. She also has mild pain in the lower abdomen that comes and goes and feels like she has the urge to urinate more frequently with decrease urine output. Has gone to HD on schedule, but was vomiting throughout dialysis. Denies suspicious ingestions, sick contacts, recent travel. Did have UTI treated at last admission and required foley catheter which was discontinued at SNF.  The entirety of the medical encounter was facilitated by an in-person Spanish interpretor.  ED Course: Afebrile, blood pressure low-normal without hypoxia. Appeared cachectic with abdominal tenderness without peritoneal signs. RUQ U/S without hepatobiliary abnormality. Stable labs consistent with ESRD, K 5.1. Urinalysis with significant pyuria and bacteriuria. CXR with left pleural effusion and atelectasis, troponin negative and ECG nonischemic, LVH. Hospitalists called to admit.   Review of Systems: +increasing fatigue, generalized weakness over past week with decreased exercise tolerance despite ongoing home health services. Denies dysphagia or odynophagia or choking. She denies fevers, chills, chest pain, shortness of breath, palpitations, hematemesis, hematochezia, melena,  and per HPI. All others reviewed and are negative.   Past Medical History:  Diagnosis Date  . Anemia   . Chronic kidney disease   . Diabetes mellitus without complication (HCC)   . GERD (gastroesophageal reflux disease)   . Stroke [facility](HCC)    in her 7040's   Past Surgical History:  Procedure Laterality Date  . APPENDECTOMY    . BASCILIC VEIN TRANSPOSITION Left 11/24/2016   Procedure: FIRST STAGE BASCILIC VEIN TRANSPOSITION;  Surgeon: Maeola Harmanain, Brandon Christopher, MD;  [facility];  Service: Vascular;  Laterality: Left;  . EYE SURGERY Bilateral    cataract surgery with lens implant  . INSERTION OF DIALYSIS CATHETER Right 11/24/2016   Procedure: INSERTION OF TUNNELED DIALYSIS CATHETER;  Surgeon: Maeola Harmanain, Brandon Christopher, MD;  [facility];  Service: Vascular;  Laterality: Right;   - No EtOH or smoke exposure, recently discharged from SNF to home living with multiple family members. Gets around with a walker.  reports that she has never smoked. She has never used smokeless tobacco. She reports that she does not drink alcohol or use drugs. No Known Allergies Family History  Problem Relation Age of Onset  . Diabetes Other   . CAD Neg Hx   . Cancer Neg Hx   . Renal Disease Neg Hx    - Family history otherwise reviewed and not pertinent.  Prior to Admission medications   Medication Sig Start Date End Date Taking? Authorizing Provider  acetaminophen (TYLENOL) 650 MG CR tablet Take 650 mg by mouth every 8 (eight) hours as needed for pain.    [provider]  Amino Acids-Protein Hydrolys (FEEDING SUPPLEMENT, PRO-STAT SUGAR FREE 64,) LIQD Take 30 mLs by mouth 2 (two) times daily. 09/04/17   Osvaldo ShipperKrishnan, Gokul, MD  aspirin EC  81 MG EC tablet Take 1 tablet (81 mg total) by mouth daily. 09/21/17   Osvaldo Shipper, MD  calcium acetate (PHOSLO) 667 MG capsule Take 667 mg by mouth 3 (three) times daily with meals. 02/20/17   [provider]  Darbepoetin Alfa (ARANESP) 40 MCG/0.4ML SOSY  injection Inject 0.4 mLs (40 mcg total) into the vein every Tuesday with hemodialysis. 09/22/17   Osvaldo Shipper, MD  doxercalciferol (HECTOROL) 4 MCG/2ML injection Inject 1.5 mLs (3 mcg total) into the vein Every Tuesday,Thursday,and Saturday with dialysis. 09/22/17   Osvaldo Shipper, MD  ferric gluconate 62.5 mg in sodium chloride 0.9 % 100 mL Inject 62.5 mg into the vein every Tuesday with hemodialysis. 09/22/17   Osvaldo Shipper, MD  insulin aspart (NOVOLOG FLEXPEN) 100 UNIT/ML FlexPen Inject 4 Units into the skin 3 (three) times daily after meals. 09/30/17   [provider]  Insulin Glargine (LANTUS SOLOSTAR) 100 UNIT/ML Solostar Pen Inject 5 Units into the skin at bedtime. 09/30/17   [provider]  meclizine (ANTIVERT) 12.5 MG tablet Take 1 tablet (12.5 mg total) by mouth 3 (three) times daily as needed for dizziness. 09/21/17   Osvaldo Shipper, MD  methocarbamol (ROBAXIN) 500 MG tablet Take 1 tablet (500 mg total) by mouth every 6 (six) hours as needed for muscle spasms. 09/04/17   Osvaldo Shipper, MD  multivitamin (RENA-VIT) TABS tablet Take 1 tablet by mouth at bedtime. 09/04/17   Osvaldo Shipper, MD  Nutritional Supplements (FEEDING SUPPLEMENT, NEPRO CARB STEADY,) LIQD Take 237 mLs by mouth 2 (two) times daily between meals. 09/04/17   Osvaldo Shipper, MD  pantoprazole (PROTONIX) 40 MG tablet Take 1 tablet (40 mg total) by mouth daily. 09/04/17   Osvaldo Shipper, MD  polyethylene glycol [facility] Ethelene Hal) packet Take 17 g by mouth daily as needed for mild constipation. 09/04/17   Osvaldo Shipper, MD  UNABLE TO FIND 1500 cc Fluid Restriction - Nursing to give 800 on 7-3, 500 on 3-11 and 200 on 11-7.    [provider]    Physical Exam: Vitals:   10/19/2017 1600 11/02/2017 1615 11/12/2017 1700 10/26/2017 1715  BP: (!) 137/50 (!) 135/49 (!) 132/51 (!) 132/45  Pulse: 81 82 81 81  Resp: (!) 22 15 19 17   Temp:      TempSrc:      SpO2: 99% 96% 95% 96%   Constitutional: Cachectic  chronically ill-appearing female in no distress, calm demeanor Eyes: Lids and conjunctivae normal, PERRL ENMT: Mucous membranes are dry. Posterior pharynx clear of any exudate or lesions. Poor dentition.  Neck: normal, supple, no masses, no thyromegaly Respiratory: Non-labored breathing room air without accessory muscle use. Clear breath sounds to auscultation bilaterally, slightly diminished in left lower zone posteriorly. Cardiovascular: Regular rate and rhythm, no murmurs, rubs, or gallops. No carotid bruits. No JVD. No LE edema. 2+ pedal pulses. Abdomen: Normoactive bowel sounds. Mild suprapubic tenderness and epigastric tenderness. No distention. Scaphoid with palpable pulsating aorta, does not feel grossly enlarged. No hepatomegaly.  GU: No indwelling catheter Musculoskeletal: No clubbing / cyanosis. No joint deformity upper and lower extremities. Good ROM, no contractures. Diffusely decreased muscle tone. Skin: Warm, dry. No rashes, wounds, or ulcers. No significant lesions noted.  Neurologic: CN II-XII grossly intact. Gait not assessed. Speech normal. No focal deficits in motor strength or sensation in all extremities.  Psychiatric: Alert and oriented x3. Normal judgment and insight. Mood anxious with broad affect.   Labs on Admission: I have personally reviewed following labs and  imaging studies  CBC: Recent Labs  Lab 2017-11-04 1212  WBC 4.7  NEUTROABS 3.2  HGB 11.2*  HCT 37.3  MCV 98.4  PLT 344   Basic Metabolic Panel: Recent Labs  Lab 11-04-17 1212  NA 138  K 5.1  CL 92*  CO2 21*  GLUCOSE 116*  BUN 47*  CREATININE 4.77*  CALCIUM 10.3   GFR: CrCl cannot be calculated (Unknown ideal weight.). Liver Function Tests: Recent Labs  Lab 2017-11-04 1212  AST 27  ALT 15  ALKPHOS 120  BILITOT 0.7  PROT 7.1  ALBUMIN 3.5   Recent Labs  Lab 11-04-17 1212  LIPASE 59*   No results for input(s): AMMONIA in the last 168 hours. Coagulation Profile: No results for  input(s): INR, PROTIME in the last 168 hours. Cardiac Enzymes: No results for input(s): CKTOTAL, CKMB, CKMBINDEX, TROPONINI in the last 168 hours. BNP (last 3 results) No results for input(s): PROBNP in the last 8760 hours. HbA1C: No results for input(s): HGBA1C in the last 72 hours. CBG: Recent Labs  Lab 11-04-2017 1229  GLUCAP 109*   Lipid Profile: No results for input(s): CHOL, HDL, LDLCALC, TRIG, CHOLHDL, LDLDIRECT in the last 72 hours. Thyroid Function Tests: No results for input(s): TSH, T4TOTAL, FREET4, T3FREE, THYROIDAB in the last 72 hours. Anemia Panel: No results for input(s): VITAMINB12, FOLATE, FERRITIN, TIBC, IRON, RETICCTPCT in the last 72 hours. Urine analysis:    Component Value Date/Time   COLORURINE YELLOW 2017-11-04 1410   APPEARANCEUR TURBID (A) 2017-11-04 1410   LABSPEC 1.010 Nov 04, 2017 1410   PHURINE 7.0 2017-11-04 1410   GLUCOSEU NEGATIVE 11/04/17 1410   HGBUR SMALL (A) 11-04-2017 1410   BILIRUBINUR NEGATIVE Nov 04, 2017 1410   KETONESUR NEGATIVE 11-04-17 1410   PROTEINUR >=300 (A) November 04, 2017 1410   UROBILINOGEN 0.2 02/20/2015 2223   NITRITE NEGATIVE 11-04-17 1410   LEUKOCYTESUR MODERATE (A) 04-Nov-2017 1410    Recent Results (from the past 240 hour(s))  C difficile quick scan w PCR reflex     Status: None   Collection Time: 04-Nov-2017  4:36 PM  Result Value Ref Range Status   C Diff antigen NEGATIVE NEGATIVE Final   C Diff toxin NEGATIVE NEGATIVE Final   C Diff interpretation No C. difficile detected.  Final    Comment: Performed at [facility], [address] [place], [place] [address]     Radiological Exams on Admission: Dg Chest Port 1 View  Result Date: November 04, 2017 CLINICAL DATA:  Weakness. EXAM: PORTABLE CHEST 1 VIEW COMPARISON:  Radiograph of September 19, 2017. FINDINGS: Stable cardiomegaly. Atherosclerosis of thoracic aorta is noted. No pneumothorax is noted. Right lung is clear. Small left pleural effusion is noted with probable  associated subsegmental atelectasis. Degenerative changes seen involving the left shoulder. IMPRESSION: Interval development of small left pleural effusion with associated subsegmental atelectasis. Aortic Atherosclerosis (ICD10-I70.0). Electronically Signed   By: Lupita Raider, M.D.   On: 11/04/2017 12:34   US Abdomen Limited Ruq  Result Date: 2017-11-04 CLINICAL DATA:  Upper abdominal tenderness EXAM: ULTRASOUND ABDOMEN LIMITED RIGHT UPPER QUADRANT COMPARISON:  None. FINDINGS: Gallbladder: No gallstones or wall thickening visualized. There is no pericholecystic fluid. No sonographic Murphy sign noted by sonographer. Common bile duct: Diameter: 2 mm. No intrahepatic or extrahepatic biliary duct dilatation. Liver: No focal lesion identified. Within normal limits in parenchymal echogenicity. Portal vein is patent on color Doppler imaging with normal direction of blood flow towards the liver. Right kidney shows increased echogenicity. IMPRESSION: Increase in  right renal echogenicity, a finding that may be indicative of a degree of medical renal disease. Appropriate laboratory correlation advised. Study otherwise unremarkable. Electronically Signed   By: Bretta Bang III M.D.   On: 11/18/17 13:20    EKG: Independently reviewed. High voltage QRS complexes suggestive of LVH and T waves more prominent than previous tracing. No ischemic changes. QTc .   Assessment/Plan Active Problems:   * No active hospital problems. *   Nausea, vomiting, diarrhea: Symptoms consistent with viral gastroenteritis vs. related to UTI. CDiff checked and negative. Continues to have refractory vomiting. Lipase is 59, above ULN of 51.  - Abdominal tenderness on exam for me is epigastric, lipase mildly elevated. Will get CT w/o contrast as she still makes some urine and doubt pancreatic necrosis or vascular complications.  - Supportive care, advance diet as tolerated, start with liquids - QTc noted to be borderline  prolonged. Will cautiously give zofran prn and continue telemetry. - Will give gentle IV fluids overnight as she is volume down.  Chronic HFpEF: Note LVH on ECG and EF 55-60%, G1DD on echo.  - Daily weights, I/O.   Symptomatic pyuria: With I/O cath.  - Start ceftriaxone empirically - Urine culture sent from ED  History of urinary retention: Foley was discontinued between time of last discharge and arrival here.  - Check PVR  ESRD: TTS HD, started 2018. - Will notify nephrology 7/13 for need for routine HD.   Protein calorie malnutrition:  - Dietitian consult - Has h/o dysphagia per records, will ask for SLP evaluation.   Generalized weakness:  - Continue PT/OT  History of CVA: Start ASA once med-rec complete and pt taking po  GERD: Start PPI once med-rec complete and pt taking po  T2DM: With poor po, CBG is 109.  - Check CBG AC/HS, cover with sensitive SSI.   Anemia of chronic disease: stable, no bleeding  DVT prophylaxis: Heparin  Code Status: Full  Family Communication: None at bedside, called without answer Disposition Plan: Home once improved Consults called: None  Admission status: Inpatient    Tyrone Nine, MD [facility] [email or web address] Password [facility] 11/18/2017, 6:12 PM

## 2017-10-30 NOTE — ED Provider Notes (Signed)
[facility] EMERGENCY DEPARTMENT Provider Note   CSN: [id number] Arrival date & time: 11/12/2017  1118     History   Chief Complaint Chief Complaint  Patient presents with  . Nausea   Patient is Spanish-speaking only and translator was used throughout this interview.  HPI Frances Reeves is a 67 y.o. female.  HPI   Patient is a 67 year old female with a history of anemia, D (on dialysis, M W[facility], [place]), appendectomy, who presents to the emergency department today for evaluation of abd pain, NVD for the last 3 days. No bloody stools or hematemesis. Abdominal pain is epigastric and RUQ.  Ports a burning sensation to her abdomen.  Reports generalized weakness as well.  She denies any unilateral numbness or weakness.  Denies any headaches, vision changes, dizziness or lightheadedness.  During exam pt had 30 second episode of chest pain and shortness of breath that resolved on its own.  States she has had a dry cough.  Patient states that she still makes some urine.  She reports some suprapubic pain, but denies dysuria.  States she completed dialysis yesterday however was vomiting while she was at dialysis.  1:56 PM attempted to contact patients daughter and was sent to voicemail  Past Medical History:  Diagnosis Date  . Anemia   . Chronic kidney disease   . Diabetes mellitus without complication (HCC)   . GERD (gastroesophageal reflux disease)   . Stroke [facility])    in her 46's    Patient Active Problem List   Diagnosis Date Noted  . Anemia due to end stage renal disease (HCC) 10/08/2017  . Acute retention of urine 09/24/2017  . Fluid overload 09/19/2017  . Dizziness 09/19/2017  . Pericardial effusion 09/19/2017  . Lower urinary tract infectious disease 09/15/2017  . Unresponsiveness 09/15/2017  . Hypoglycemia 09/15/2017  . Hyponatremia 09/15/2017  . 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 Constipation due  to opioid therapy 09/07/2017  . Malnutrition of moderate degree 09/01/2017  . Pubic ramus fracture, left, closed, initial encounter (HCC) 08/30/2017  . Chronic diastolic heart failure (HCC) 08/30/2017  . Pubic ramus fracture (HCC) 08/30/2017  . Noncompliance of patient with renal dialysis (HCC) 05/13/2017  . Uncontrolled type 2 diabetes mellitus with ESRD (end-stage renal disease) (HCC) 02/07/2017  . GERD without esophagitis 02/07/2017  . Anemia 02/07/2017  . History of CVA (cerebrovascular accident) 02/07/2017  . Chest pressure 02/07/2017  . Shortness of breath 02/07/2017    Past Surgical History:  Procedure Laterality Date  . APPENDECTOMY    . BASCILIC VEIN TRANSPOSITION Left 11/24/2016   Procedure: FIRST STAGE BASCILIC VEIN TRANSPOSITION;  Surgeon: Maeola Harman, MD;  Location: [facility] OR;  Service: Vascular;  Laterality: Left;  . EYE SURGERY Bilateral    cataract surgery with lens implant  . INSERTION OF DIALYSIS CATHETER Right 11/24/2016   Procedure: INSERTION OF TUNNELED DIALYSIS CATHETER;  Surgeon: Maeola Harman, MD;  Location: [facility] OR;  Service: Vascular;  Laterality: Right;     OB History   None      Home Medications    Prior to Admission medications   Medication Sig Start Date End Date Taking? Authorizing Provider  acetaminophen (TYLENOL) 650 MG CR tablet Take 650 mg by mouth every 8 (eight) hours as needed for pain.    [provider]  Amino Acids-Protein Hydrolys (FEEDING SUPPLEMENT, PRO-STAT SUGAR FREE 64,) LIQD Take 30 mLs by mouth 2 (two)  times daily. 09/04/17   Osvaldo Shipper, MD  aspirin EC 81 MG EC tablet Take 1 tablet (81 mg total) by mouth daily. 09/21/17   Osvaldo Shipper, MD  calcium acetate (PHOSLO) 667 MG capsule Take 667 mg by mouth 3 (three) times daily with meals. 02/20/17   [provider]  Darbepoetin Alfa (ARANESP) 40 MCG/0.4ML SOSY injection Inject 0.4 mLs (40 mcg total) into the vein every Tuesday with hemodialysis.  09/22/17   Osvaldo Shipper, MD  doxercalciferol (HECTOROL) 4 MCG/2ML injection Inject 1.5 mLs (3 mcg total) into the vein Every Tuesday,Thursday,and Saturday with dialysis. 09/22/17   Osvaldo Shipper, MD  ferric gluconate 62.5 mg in sodium chloride 0.9 % 100 mL Inject 62.5 mg into the vein every Tuesday with hemodialysis. 09/22/17   Osvaldo Shipper, MD  insulin aspart (NOVOLOG FLEXPEN) 100 UNIT/ML FlexPen Inject 4 Units into the skin 3 (three) times daily after meals. 09/30/17   [provider]  Insulin Glargine (LANTUS SOLOSTAR) 100 UNIT/ML Solostar Pen Inject 5 Units into the skin at bedtime. 09/30/17   [provider]  meclizine (ANTIVERT) 12.5 MG tablet Take 1 tablet (12.5 mg total) by mouth 3 (three) times daily as needed for dizziness. 09/21/17   Osvaldo Shipper, MD  methocarbamol (ROBAXIN) 500 MG tablet Take 1 tablet (500 mg total) by mouth every 6 (six) hours as needed for muscle spasms. 09/04/17   Osvaldo Shipper, MD  multivitamin (RENA-VIT) TABS tablet Take 1 tablet by mouth at bedtime. 09/04/17   Osvaldo Shipper, MD  Nutritional Supplements (FEEDING SUPPLEMENT, NEPRO CARB STEADY,) LIQD Take 237 mLs by mouth 2 (two) times daily between meals. 09/04/17   Osvaldo Shipper, MD  pantoprazole (PROTONIX) 40 MG tablet Take 1 tablet (40 mg total) by mouth daily. 09/04/17   Osvaldo Shipper, MD  polyethylene glycol [facility] Ethelene Hal) packet Take 17 g by mouth daily as needed for mild constipation. 09/04/17   Osvaldo Shipper, MD  UNABLE TO FIND 1500 cc Fluid Restriction - Nursing to give 800 on 7-3, 500 on 3-11 and 200 on 11-7.    [provider]    Family History Family History  Problem Relation Age of Onset  . Diabetes Other   . CAD Neg Hx   . Cancer Neg Hx   . Renal Disease Neg Hx     Social History Social History   Tobacco Use  . Smoking status: Never Smoker  . Smokeless tobacco: Never Used  Substance Use Topics  . Alcohol use: No  . Drug use: No     Allergies     Patient has no known allergies.   Review of Systems Review of Systems  Constitutional: Negative for chills and fever.  HENT: Negative for ear pain and sore throat.   Eyes: Negative for pain and visual disturbance.  Respiratory: Positive for cough and shortness of breath (resolved).   Cardiovascular: Positive for chest pain (resolved). Negative for palpitations.  Gastrointestinal: Negative for abdominal pain and vomiting.  Genitourinary: Positive for pelvic pain. Negative for dysuria, frequency, hematuria and urgency.  Musculoskeletal: Negative for myalgias.  Skin: Negative for color change and rash.  Neurological: Positive for weakness (generalized). Negative for dizziness, light-headedness and headaches.  All other systems reviewed and are negative.   Physical Exam Updated Vital Signs BP (!) 137/50   Pulse 81   Temp 98.7 F (37.1 C) (Oral)   Resp (!) 22   SpO2 99%   Physical Exam  Constitutional: She is oriented to person, place, and time.  She appears well-developed and well-nourished.  Cachectic and chronically ill appearing  HENT:  Head: Normocephalic and atraumatic.  Eyes: Pupils are equal, round, and reactive to light. Conjunctivae and EOM are normal.  No nystagmus  Neck: Neck supple.  Cardiovascular: Normal rate, regular rhythm, normal heart sounds and intact distal pulses.  No murmur heard. Pulmonary/Chest: Effort normal and breath sounds normal. No stridor. No respiratory distress. She has no wheezes.  Abdominal: Soft.  RUQ and epigastric TTP, no significant suprapubic TTP.  Musculoskeletal: She exhibits no edema.  Neurological: She is alert and oriented to person, place, and time.  Moving all extremities with sensation intact throughout  Skin: Skin is warm and dry. Capillary refill takes less than 2 seconds.  Psychiatric: She has a normal mood and affect.  Nursing note and vitals reviewed.  ED Treatments / Results  Labs (all labs ordered are listed, but  only abnormal results are displayed) Labs Reviewed  CBC WITH DIFFERENTIAL/PLATELET - Abnormal; Notable for the following components:      Result Value   RBC 3.79 (*)    Hemoglobin 11.2 (*)    RDW 15.7 (*)    All other components within normal limits  COMPREHENSIVE METABOLIC PANEL - Abnormal; Notable for the following components:   Chloride 92 (*)    CO2 21 (*)    Glucose, Bld 116 (*)    BUN 47 (*)    Creatinine, Ser 4.77 (*)    GFR calc non Af Amer 8 (*)    GFR calc Af Amer 10 (*)    Anion gap 25 (*)    All other components within normal limits  LIPASE, BLOOD - Abnormal; Notable for the following components:   Lipase 59 (*)    All other components within normal limits  URINALYSIS, ROUTINE W REFLEX MICROSCOPIC - Abnormal; Notable for the following components:   APPearance TURBID (*)    Hgb urine dipstick SMALL (*)    Protein, ur >=300 (*)    Leukocytes, UA MODERATE (*)    WBC, UA >50 (*)    Bacteria, UA MANY (*)    All other components within normal limits  CBG MONITORING, ED - Abnormal; Notable for the following components:   Glucose-Capillary 109 (*)    All other components within normal limits  C DIFFICILE QUICK SCREEN W PCR REFLEX  URINE CULTURE  I-STAT TROPONIN, ED    EKG EKG Interpretation  Date/Time:  Friday October 30 2017 12:24:33 EDT Ventricular Rate:  85 PR Interval:    QRS Duration: 90 QT Interval:  408 QTC Calculation: 486 R Axis:   51 Text Interpretation:  Sinus rhythm Probable left atrial enlargement Probable left ventricular hypertrophy Borderline prolonged QT interval Confirmed by Kristine RoyalMessick, Peter [phone]([id number]) on 11/13/2017 12:40:03 PM   Radiology Dg Chest Port 1 View  Result Date: 11/07/2017 CLINICAL DATA:  Weakness. EXAM: PORTABLE CHEST 1 VIEW COMPARISON:  Radiograph of September 19, 2017. FINDINGS: Stable cardiomegaly. Atherosclerosis of thoracic aorta is noted. No pneumothorax is noted. Right lung is clear. Small left pleural effusion is noted with probable  associated subsegmental atelectasis. Degenerative changes seen involving the left shoulder. IMPRESSION: Interval development of small left pleural effusion with associated subsegmental atelectasis. Aortic Atherosclerosis (ICD10-I70.0). Electronically Signed   By: Lupita RaiderJames  Green Jr, M.D.   On: 10/20/2017 12:34   [place] Abdomen Limited Ruq  Result Date: 11/02/2017 CLINICAL DATA:  Upper abdominal tenderness EXAM: ULTRASOUND ABDOMEN LIMITED RIGHT UPPER QUADRANT COMPARISON:  None. FINDINGS: Gallbladder: No gallstones or wall  thickening visualized. There is no pericholecystic fluid. No sonographic Murphy sign noted by sonographer. Common bile duct: Diameter: 2 mm. No intrahepatic or extrahepatic biliary duct dilatation. Liver: No focal lesion identified. Within normal limits in parenchymal echogenicity. Portal vein is patent on color Doppler imaging with normal direction of blood flow towards the liver. Right kidney shows increased echogenicity. IMPRESSION: Increase in right renal echogenicity, a finding that may be indicative of a degree of medical renal disease. Appropriate laboratory correlation advised. Study otherwise unremarkable. Electronically Signed   By: Bretta Bang III M.D.   On: 10/21/2017 13:20    Procedures Procedures (including critical care time)  Medications Ordered in ED Medications  cefTRIAXone (ROCEPHIN) 1 g in sodium chloride 0.9 % 100 mL IVPB (has no administration in time range)     Initial Impression / Assessment and Plan / ED Course  I have reviewed the triage vital signs and the nursing notes.  Pertinent labs & imaging results that were available during my care of the patient were reviewed by me and considered in my medical decision making (see chart for details).    Discussed pt presentation and exam findings with Dr. Rodena Medin, who urgently evaluated the patient and agrees that she will likely require admission for treatment of her UTI and nausea vomiting and diarrhea given  her multiple comorbidities.  4:17 PM CONSULT with hospitalist, Dr. Hazeline Junker who will admit the patient. He recommended starting rocephin.   Final Clinical Impressions(s) / ED Diagnoses   Final diagnoses:  RUQ abdominal tenderness  Nausea without vomiting  Urinary tract infection without hematuria, site unspecified  Generalized weakness   Patient presented with right upper quadrant and epigastric abdominal pain.  Also with nausea vomiting and diarrhea for the last 3 days.  Also complaining of generalized weakness.  She is afebrile here.  Vital signs are stable.  On review of records, I do not see the patient has recently been on antibiotics however C. difficile was ordered given that she has been in a rehab facility and could have been exposed to C. difficile during her admission.  This is pending currently.  Troponin is negative.  Chest x-ray shows left pleural effusion with some subsegmental atelectasis.  Has dry cough on exam.   She has a stable anemia has no elevated white blood cell count.  Troponin negative.  ECG with normal sinus rhythm, probable left atrial enlargement, probable left ventricular hypertrophy and borderline prolonged QT.  No ischemic changes noted.  UA suggestive of UTI.  Patient still complaining of nausea and epigastric abdominal pain.  She has had some episodes of vomiting in the ED.  Suspect viral gastroenteritis. Feel that she will require admission giving her generalized weakness and persistent nausea, vomiting, diarrhea, and UTI in the setting of her comorbidities.  Pt admitted the hospitalist service.   ED Discharge Orders    None       Rayne Du 11/14/2017 1621    Wynetta Fines, MD 11/01/17 [phone]

## 2017-10-30 NOTE — ED Notes (Signed)
Attempted to call report x2

## 2017-10-31 ENCOUNTER — Other Ambulatory Visit (HOSPITAL_COMMUNITY): Payer: Medicare Other

## 2017-10-31 DIAGNOSIS — I469 Cardiac arrest, cause unspecified: Secondary | ICD-10-CM | POA: Diagnosis not present

## 2017-10-31 DIAGNOSIS — K529 Noninfective gastroenteritis and colitis, unspecified: Secondary | ICD-10-CM | POA: Diagnosis not present

## 2017-10-31 DIAGNOSIS — R112 Nausea with vomiting, unspecified: Secondary | ICD-10-CM

## 2017-10-31 DIAGNOSIS — R1013 Epigastric pain: Secondary | ICD-10-CM | POA: Diagnosis not present

## 2017-10-31 DIAGNOSIS — R092 Respiratory arrest: Secondary | ICD-10-CM | POA: Diagnosis present

## 2017-10-31 LAB — BASIC METABOLIC PANEL
BUN: 69 mg/dL — ABNORMAL HIGH (ref 8–23)
CHLORIDE: 93 mmol/L — AB (ref 98–111)
Calcium: 10.2 mg/dL (ref 8.9–10.3)
Creatinine, Ser: 5.73 mg/dL — ABNORMAL HIGH (ref 0.44–1.00)
GFR calc Af Amer: 8 mL/min — ABNORMAL LOW (ref >60)
GFR calc non Af Amer: 7 mL/min — ABNORMAL LOW (ref >60)
GLUCOSE: 135 mg/dL — AB (ref 70–99)
POTASSIUM: 5.3 mmol/L — AB (ref 3.5–5.1)
Sodium: 139 mmol/L (ref 135–145)

## 2017-10-31 LAB — MRSA PCR SCREENING: MRSA by PCR: NEGATIVE

## 2017-10-31 LAB — GLUCOSE, CAPILLARY
GLUCOSE-CAPILLARY: 150 mg/dL — AB (ref 70–99)
GLUCOSE-CAPILLARY: 140 mg/dL — AB (ref 70–99)
Glucose-Capillary: 126 mg/dL — ABNORMAL HIGH (ref 70–99)

## 2017-10-31 MED ORDER — LEVOFLOXACIN IN D5W 500 MG/100ML IV SOLN: 500.0000 mg | INTRAVENOUS | Status: DC

## 2017-10-31 MED ORDER — SODIUM BICARBONATE 8.4 % IV SOLN
INTRAVENOUS | Status: AC
  Filled 2017-10-31: qty 50

## 2017-10-31 MED ORDER — CIPROFLOXACIN IN D5W 400 MG/200ML IV SOLN: 400.0000 mg | Freq: Two times a day (BID) | INTRAVENOUS | Status: DC

## 2017-10-31 MED ORDER — LEVOFLOXACIN IN D5W 750 MG/150ML IV SOLN: 750.0000 mg | Freq: Once | INTRAVENOUS | Status: DC

## 2017-10-31 MED ORDER — EPINEPHRINE PF 1 MG/10ML IJ SOSY
PREFILLED_SYRINGE | INTRAMUSCULAR | Status: AC
  Filled 2017-10-31: qty 20

## 2017-10-31 MED ORDER — QUETIAPINE FUMARATE 25 MG PO TABS: 25.0000 mg | ORAL_TABLET | Freq: Once | ORAL | Status: DC

## 2017-10-31 MED ORDER — SODIUM BICARBONATE 8.4 % IV SOLN: 50.0000 meq | Freq: Once | INTRAVENOUS | Status: DC

## 2017-10-31 MED ORDER — SODIUM BICARBONATE 8.4 % IV SOLN: 100.0000 meq | Freq: Once | INTRAVENOUS | Status: DC

## 2017-10-31 MED ORDER — QUETIAPINE FUMARATE 25 MG PO TABS: 25.0000 mg | ORAL_TABLET | Freq: Every day | ORAL | Status: DC

## 2017-10-31 MED ORDER — METRONIDAZOLE IN NACL 5-0.79 MG/ML-% IV SOLN: 500.0000 mg | Freq: Three times a day (TID) | INTRAVENOUS | Status: DC

## 2017-10-31 MED ORDER — TRAMADOL HCL 50 MG PO TABS
50.0000 mg | ORAL_TABLET | Freq: Once | ORAL | Status: AC
  Administered 2017-10-31: 50 mg via ORAL
  Filled 2017-10-31: qty 1

## 2017-11-02 LAB — GLUCOSE, CAPILLARY: GLUCOSE-CAPILLARY: 28 mg/dL — AB (ref 70–99)

## 2017-11-02 LAB — URINE CULTURE

## 2017-11-02 MED FILL — Medication: Qty: 1 | Status: AC

## 2017-11-19 NOTE — Code Documentation (Signed)
  Patient Name: Frances Reeves   MRN: [id number]   Date of Birth/ Sex: 08/22/1950 , female      Admission Date: 11/15/2017  Attending Provider: Shon HaleEmokpae, Courage, MD  Primary Diagnosis: Pain [R52] RUQ abdominal tenderness [R10.811] Generalized weakness [R53.1] Nausea without vomiting [R11.0] Urinary tract infection without hematuria, site unspecified [N39.0]   Indication: Pt was in her usual state of health until this AM, when she was noted to be in asystole and unresponsive. Code blue was subsequently called. At the time of arrival on scene, ACLS protocol was underway. Lab abnormalities: K:5.3, Bicarb<7 was noticed from recent results.  Technical Description:  - CPR performance duration:  21 minutes  - Was defibrillation or cardioversion used? No   - Was external pacer placed? No  - Was patient intubated pre/post CPR? No   Medications Administered: Y = Yes; Blank = No Amiodarone    Atropine    Calcium  Yes  Epinephrine  Yes  Lidocaine    Magnesium    Norepinephrine    Phenylephrine    Sodium bicarbonate  Yes  Vasopressin    Other    Post CPR evaluation:  - Final Status - Was patient successfully resuscitated ? No   Miscellaneous Information:  - Time of death:  9:36 AM  - Primary team notified?  Yes  - Family Notified? Yes     Chevis PrettyMasoudi, Rei Medlen, MD   10/09/17, 9:50 AM

## 2017-11-19 NOTE — Discharge Summary (Addendum)
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 Ischemic   Cardiac arrest/cardiopulmonary arrest   Respiratory arrest/cardiopulmonary arrest    1. Colitis 2. ESRD 3. Possible UTI   History of present illness:   HPI on Admission as documented by Admitting Physician:-  Chief Complaint: Nausea vomiting diarrhea  HPI: VIOLIA KNOPF is a 67 y.o. female with a history of ESRD, chronic diastolic CHF, recurrent UTI, and recent SNF admission for pubic ramus fracture who presented to the ED for 3 days of nausea, vomiting, and diarrhea described as constant, waxing/waning, worsening now severe, with >10 watery stools per day. No medications tried, but did take usual medications today. Has tried to eat but vomits almost immediately once taking anything by mouth. Has developed associate epigastric pain which is better when she rubs the area. She also has mild pain in the lower abdomen that comes and goes and feels like she has the urge to urinate more frequently with decrease urine output. Has gone to HD on schedule, but was vomiting throughout dialysis. Denies suspicious ingestions, sick contacts, recent travel. Did have UTI treated at last admission and required foley catheter which was discontinued at SNF.  The entirety of the medical encounter was facilitated by an in-person Spanish interpretor.  ED Course: Afebrile, blood pressure low-normal without hypoxia. Appeared cachectic with abdominal tenderness without peritoneal signs. RUQ U/S without  hepatobiliary abnormality. Stable labs consistent with ESRD, K 5.1. Urinalysis with significant pyuria and bacteriuria. CXR with left pleural effusion and atelectasis, troponin negative and ECG nonischemic, LVH. Hospitalists called to admit.   Review of Systems: +increasing fatigue, generalized weakness over past week with decreased exercise tolerance despite ongoing home health services. Denies dysphagia or odynophagia or choking. She denies fevers, chills, chest pain, shortness of breath, palpitations, hematemesis, hematochezia, melena, and per HPI. All others reviewed and are negative.           Hospital Course:  1)Possible colitis--- patient was admitted on Nov 13, 2017 with nausea, vomiting, diarrhea and abdominal pain and exam and CT findings of possible colitis, she was afebrile without leukocytosis on admission . Patient's bicarb was 21, bicarb dropped to below 7 on 2017-11-14.  Upon assuming care of patient this morning upon further chart review this morning I had requested Flagyl and Levaquin be started--- however just before 9:15 AM ...Marland KitchenMarland KitchenI  Walked into the room and found patient unresponsive and pulseless and apneic,  I immediately started CPR called CODE BLUE please see separate CODE BLUE documentation, given the degree of acidosis cannot rule out ischemic bowel  2)Possible UTI in the patient ESRD with recurrent UTI--- urine culture from 09/19/2017 had grown enterococcus, based on the sensitivities I have initiated Levaquin in a.m. of 11/14/17 when I assumed care of patient, however patient "coded" prior to being able to receive Levaquin, please see order history in EMR  3)ESRD--- patient typically gets hemodialysis on T/T/S, I had requested nephrology consult from Dr. Arlean Hopping the on-call nephrologist around 8:30 AM on 11-14-17 when I assumed care of this patient, Dr Arlean Hopping was actually on the unit to see  patient when CODE BLUE was called, Dr. Arlean HoppingSchertz assisted with CODE BLUE  Review of  medications revealed that patient did get PRN Zofran around 10 PM on 2017/05/21 and around 6 AM on [id number], however patient did NOT receive Seroquel or Levaquin that was ordered on 11/17/2017 a.m, because patient "coded" prior to receiving any of these medications.  Time: Time of death 9:36 AM  Please see separate CODE BLUE documentation by the code team, neurologist Dr. Arlean HoppingSchertz was at bedside, code team was at bedside, PCCM attending was at bedside,.  Family notified  Signed:  Carlene Bickley  [facility] 10/19/2017, 11:00 AM

## 2017-11-19 NOTE — Progress Notes (Signed)
PT Cancellation Note  Patient Details Name: Frances Reeves MRN: [id number] DOB: 04/03/1951   Cancelled Treatment:    Reason Eval/Treat Not Completed: Other (comment)(Deceased) Patient with unsuccessful resuscitation attempt this morning prior to physical therapy Evaluation.   Charlsie MerlesLogan Secor [name], PT, DPT [place] [facility] S Aftyn Nott 10/25/2017, 11:27 AM

## 2017-11-19 NOTE — Progress Notes (Signed)
Code Blue-Patient did not make it.  Highly stressful event and was totally unexpected.  Chaplain called Daughter listed under emergency contact-voice mail.  Called Son Frances Reeves listed and was able to have the call transferred to doctor who shared the patient had coded and they were not able to revive her.  Son was contacting other family and may come up-chaplain ready to be paged and return for family.   10/30/2017 0900  Clinical Encounter Type  Visited With Other (Comment) (Code Blue)  Visit Type Code  Consult/Referral To Chaplain

## 2017-11-19 NOTE — Progress Notes (Signed)
Patient cleaned and presentable for family. Full linen and gown change. Oral airway and RAC and RLW removed by RN.   Lillia PaulsLaura B. RN

## 2017-11-19 NOTE — Progress Notes (Signed)
Pharmacy Antibiotic Note  Frances Reeves is a 67 y.o. female admitted on 10/20/2017 with nausea, vomiting, and diarrhea.  Pharmacy has been consulted for Levaquin dosing for collitis.  Pt also on Flagyl per MD dosing.  Of note, pt is ESRD with dialysis on MWF.  Plan: Levaquin 750mg  IV x 1 followed by 500mg  IV q48h.  Weight: 89 lb 1.1 oz (40.4 kg)  Temp (24hrs), Avg:98.2 F (36.8 C), Min:97.5 F (36.4 C), Max:98.7 F (37.1 C)  Recent Labs  Lab 11/07/2017 1212 27-Apr-2017 0515  WBC 4.7  --   CREATININE 4.77* 5.73*    Estimated Creatinine Clearance: 5.9 mL/min (A) (by C-G formula based on SCr of 5.73 mg/dL (H)).    No Known Allergies  Antimicrobials this admission: Rocephin x 1 7/12 Levaquin 7/13 >> Flagyl 7/13 >>  Dose adjustments this admission:   Microbiology results: 7/12 MRSA PCR negative 7/12 Cdiff negative  Thank you for allowing pharmacy to be a part of this patient's care.  Toys 'R' UsKimberly Jolleen Seman, Pharm.D., BCPS Clinical Pharmacist Pager: [phone] Clinical phone for Sep 26, 2017 from 8:30-4:00 is [phone].  **Pharmacist phone directory can now be found on [email or web address] ([email or web address]).  [facility].  Sep 26, 2017 9:30 AM

## 2017-11-19 NOTE — Progress Notes (Signed)
Code blue actived at 0913h. Tylenol given at 0841 patient complaining of severe headache. Patient speaks spanish only, spanish interpretation used to confirm headache. Dr. Marisa Severinourage arrived at [phone] for morning rounds made aware of patient's concerns and tylenol given. He immediately went into room and started chest compressions and code blue activated. Code team to bedside. See code sheet for details of event. Time of death. [id number]. Dr. Marylu Lundemained at bedside and updated via telephone.   Lillia PaulsLaura B. RN

## 2017-11-19 NOTE — Progress Notes (Addendum)
Patient Active Problem List   Diagnosis Date Noted  . Colitis----??? Ischemic 01/03/18  . Cardiac arrest/cardiopulmonary arrest 01/03/18  . Respiratory arrest/cardiopulmonary arrest 01/03/18  . Cardiopulmonary arrest/unsuccessful resuscitation 01/03/18  . Refractory nausea and vomiting 11/12/2017  . Anemia due to end stage renal disease (HCC) 10/08/2017  . Acute retention of urine 09/24/2017  . Fluid overload 09/19/2017  . Dizziness 09/19/2017  . Pericardial effusion 09/19/2017  . Lower urinary tract infectious disease 09/15/2017  . Unresponsiveness 09/15/2017  . Hypoglycemia 09/15/2017  . Hyponatremia 09/15/2017  . 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 Constipation due to opioid therapy 09/07/2017  . Malnutrition of moderate degree 09/01/2017  . Pubic ramus fracture, left, closed, initial encounter (HCC) 08/30/2017  . Chronic diastolic heart failure (HCC) 08/30/2017  . Pubic ramus fracture (HCC) 08/30/2017  . Noncompliance of patient with renal dialysis (HCC) 05/13/2017  . Uncontrolled type 2 diabetes mellitus with ESRD (end-stage renal disease) (HCC) 02/07/2017  . GERD without esophagitis 02/07/2017  . Anemia 02/07/2017  . History of CVA (cerebrovascular accident) 02/07/2017  . Chest pressure 02/07/2017  . Shortness of breath 02/07/2017     ...Marland [email or web address]  Walked into the room and found patient unresponsive and pulseless and apneic,  I immediately started CPR called CODE BLUE please see separate CODE BLUE documentation, given the degree of acidosis cannot rule out ischemic bowel   ACLS protocol initiated------from 9:15 AM to 9:36 AM, patient without perfusing rhythym, she received epinephrine per ACLS protocol, patient received bicarb pushes-  Please see separate CODE BLUE documentation in EMR for details of ACLS protocol/CODE BLUE implementation  Patient never regained consciousness  Anesthesiologist , PCCM attending, and  nephrologist Dr. Arlean HoppingSchertz all at bedside..  Time of death 9:36 AM   Total critical care time due to systole, apnea and unresponsiveness about 21 minutes

## 2017-11-19 DEATH — deceased

## 2018-12-06 IMAGING — CT CT ANGIO CHEST
2 of 8 series · 18 of 46 positions shown · IV contrast (iopamidol)
Comparison: No prior chest CT. Multiple prior chest x-rays,
including earlier same day.

CLINICAL DATA: 67-year-old with acute onset of shortness of breath
and chest heaviness that began yesterday, associated with anxiety,
nausea and vomiting.

EXAM:
CT ANGIOGRAPHY CHEST WITH CONTRAST
TECHNIQUE: Multidetector CT imaging of the chest was performed using the
standard protocol during bolus administration of intravenous
contrast. Multiplanar CT image reconstructions and MIPs were
obtained to evaluate the vascular anatomy.
CONTRAST:  100mL [id number] IOPAMIDOL INJECTION 76% IV.

[Series 6: thins · axial · 0.53mm/px · z∈[+1138,+1366]mm · 15 of 252 slices shown]
[im 12/252  lung]
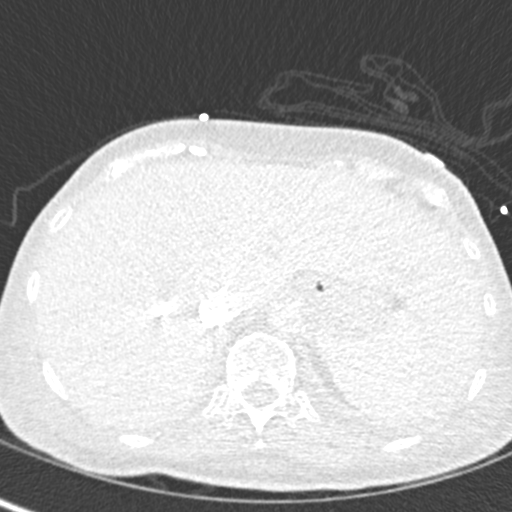
[im 35/252  soft-tissue]
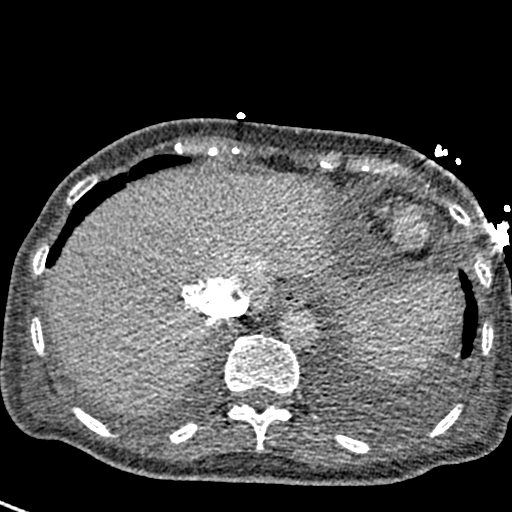
[im 46/252  lung]
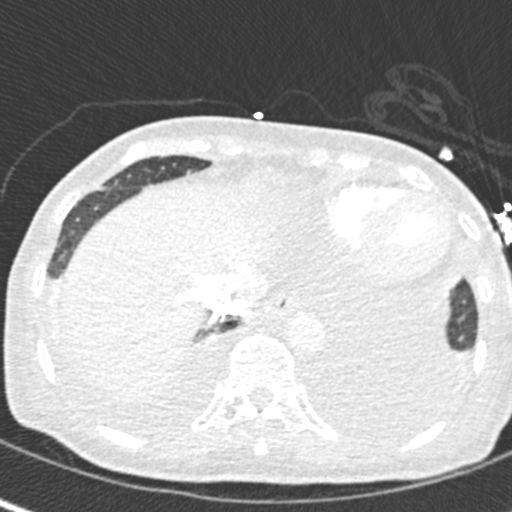
[im 58/252  soft-tissue]
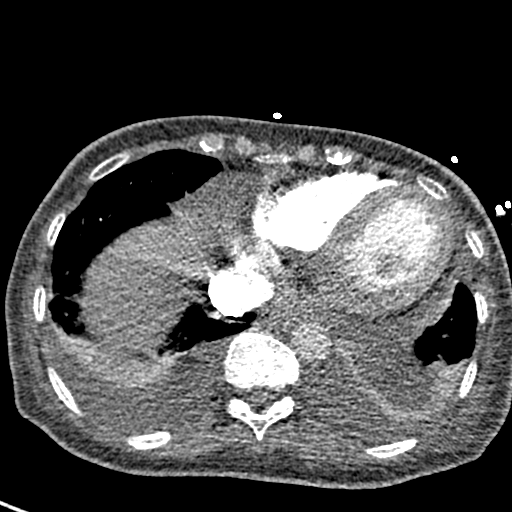
[im 80/252  lung]
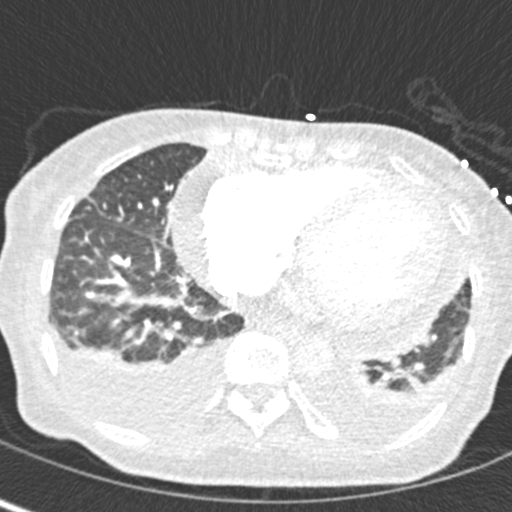
[im 92/252  soft-tissue]
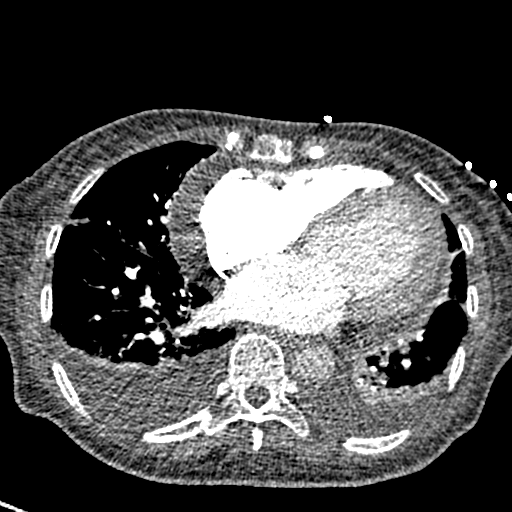
[im 115/252  lung]
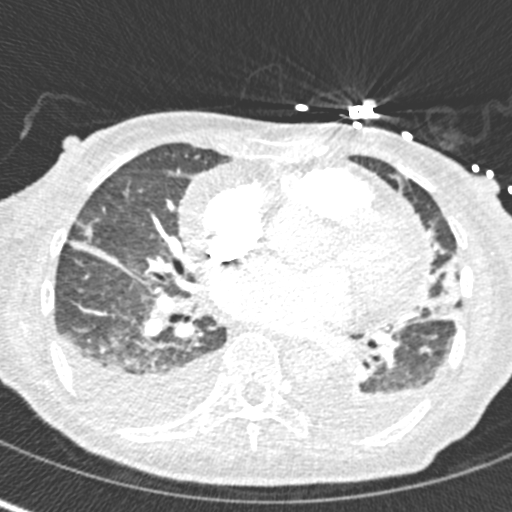
[im 126/252  soft-tissue]
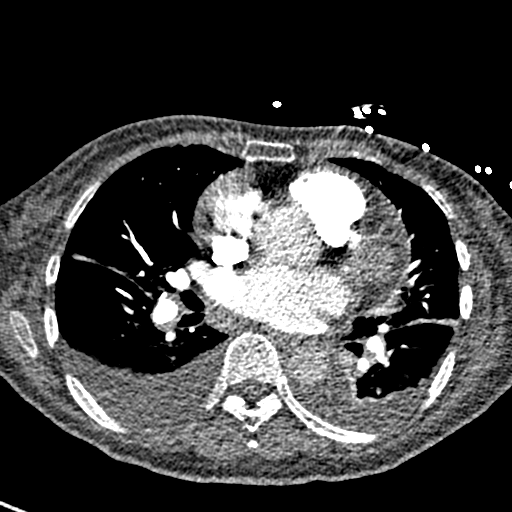
[im 137/252  lung]
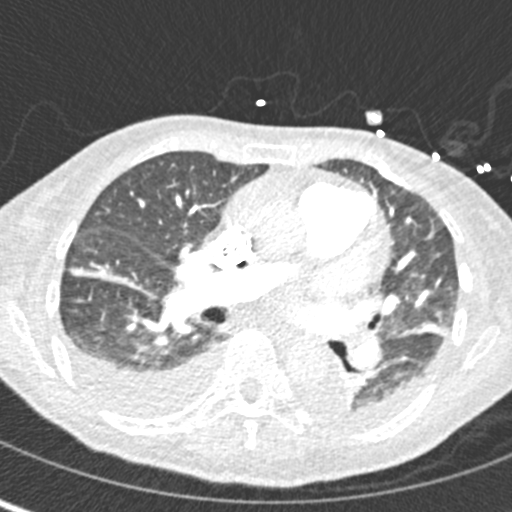
[im 160/252  soft-tissue]
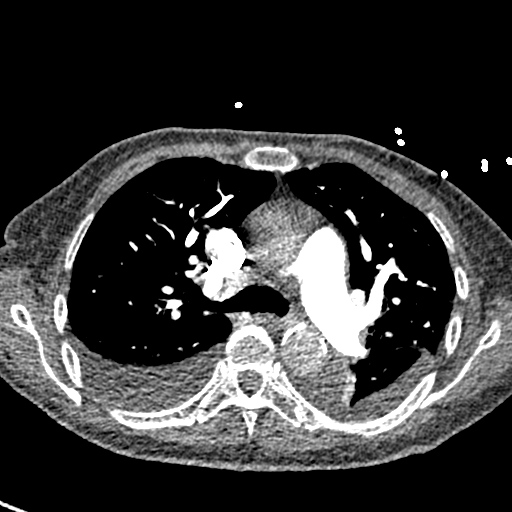
[im 172/252  lung]
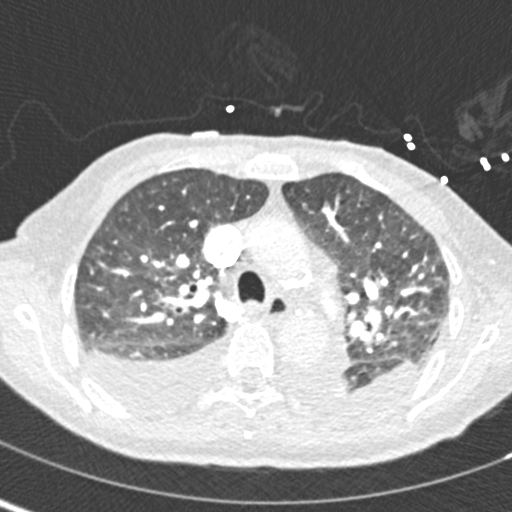
[im 194/252  soft-tissue]
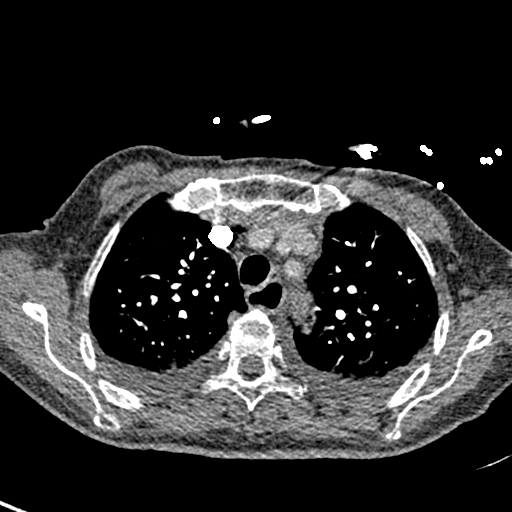
[im 206/252  lung]
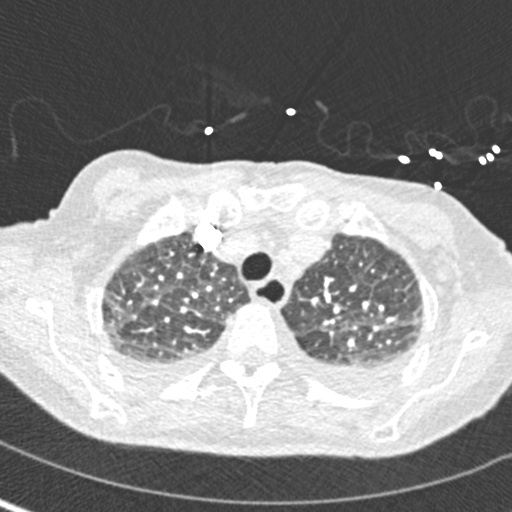
[im 217/252  soft-tissue]
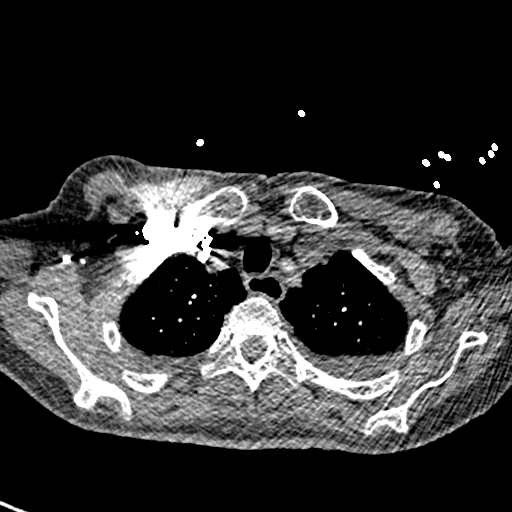
[im 240/252  lung]
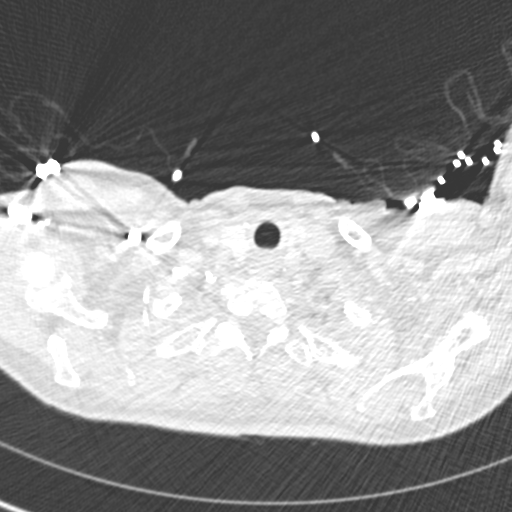

[Series 8: coronal mpr · coronal · 0.51mm/px · 3 of 150 slices shown]
[im 38/150  soft-tissue]
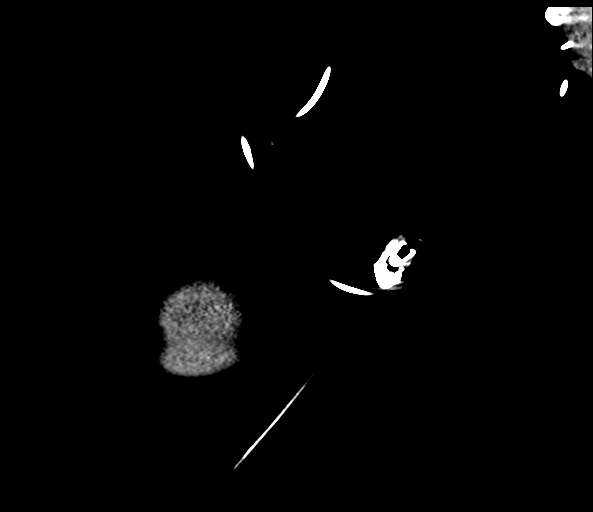
[im 75/150  soft-tissue]
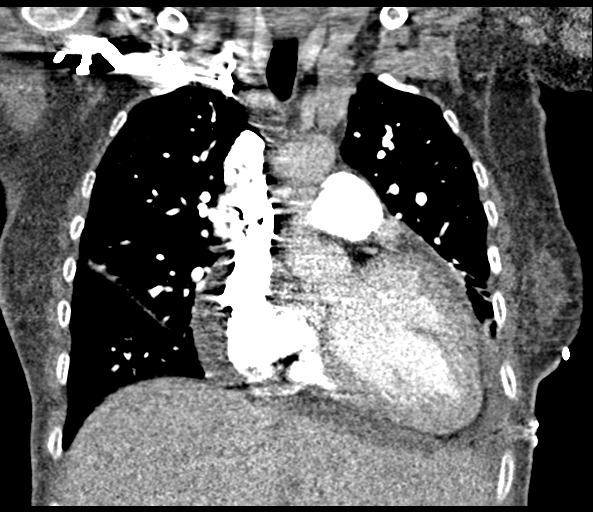
[im 112/150  soft-tissue]
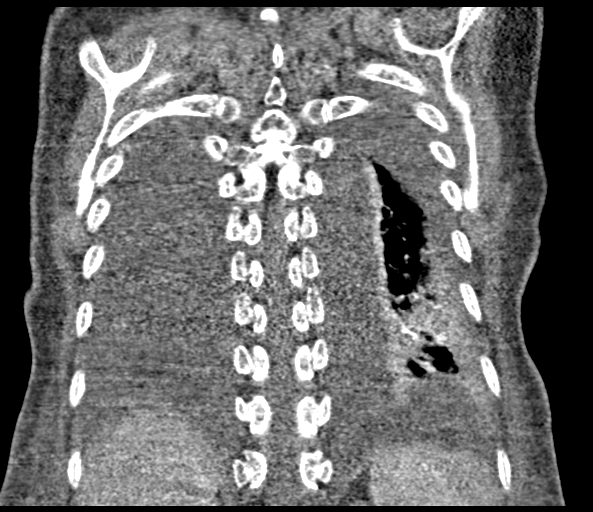

[18 of 46 positions shown; findings below may reference images not displayed]

FINDINGS: Cardiovascular: Contrast opacification of pulmonary arteries is very
good. Respiratory motion blurred many of the images. Overall, the
study is of moderate to good diagnostic quality.

No filling defects within either main pulmonary artery or their
segmental branches in either lung to suggest pulmonary embolism.
Heart moderately enlarged. Moderate to large pericardial effusion.
No visible coronary atherosclerosis. Note is made of reflux of
contrast into the intrahepatic IVC and the a patent veins.

Moderate atherosclerosis involving the thoracic and proximal
abdominal aorta without evidence of aneurysm.

Mediastinum/Nodes: No pathologically enlarged mediastinal, hilar or
axillary lymph nodes. No mediastinal masses. Normal-appearing
esophagus. Normal-appearing thyroid gland.

Lungs/Pleura: Ground-glass airspace opacities throughout both lungs,
with evidence of interstitial edema. Large BILATERAL pleural
effusions, RIGHT greater than LEFT, with associated passive
atelectasis in the lower lobes. Fluid in the minor fissure on the
RIGHT central airways patent with no significant bronchial wall
thickening.

Upper Abdomen: Visualized upper abdomen unremarkable for the early
arterial phase of enhancement. Anatomic variant in that the LEFT
lobe of the liver extends well across the midline into the LEFT
UPPER quadrant.

Musculoskeletal: Regional skeleton intact without acute or
significant osseous abnormality.

Review of the MIP images confirms the above findings.
IMPRESSION: 1. No evidence of pulmonary embolism.
2. Mild CHF, with mild interstitial pulmonary edema.
3. Large BILATERAL pleural effusions, RIGHT greater than LEFT, with
associated mild passive atelectasis in the lower lobes.
4. Moderate to large pericardial effusion.
5. Reflux of contrast into the IVC and hepatic veins likely
indicates RIGHT heart failure and/or tricuspid valvular disease.

Aortic Atherosclerosis ([id number]-170.0)

## 2018-12-06 IMAGING — DX DG CHEST 1V PORT
1 series · 1 of 1 positions shown · non-contrast
Comparison: 09/15/2017

CLINICAL DATA: Chest pain

EXAM:
PORTABLE CHEST 1 VIEW

[chest ap]
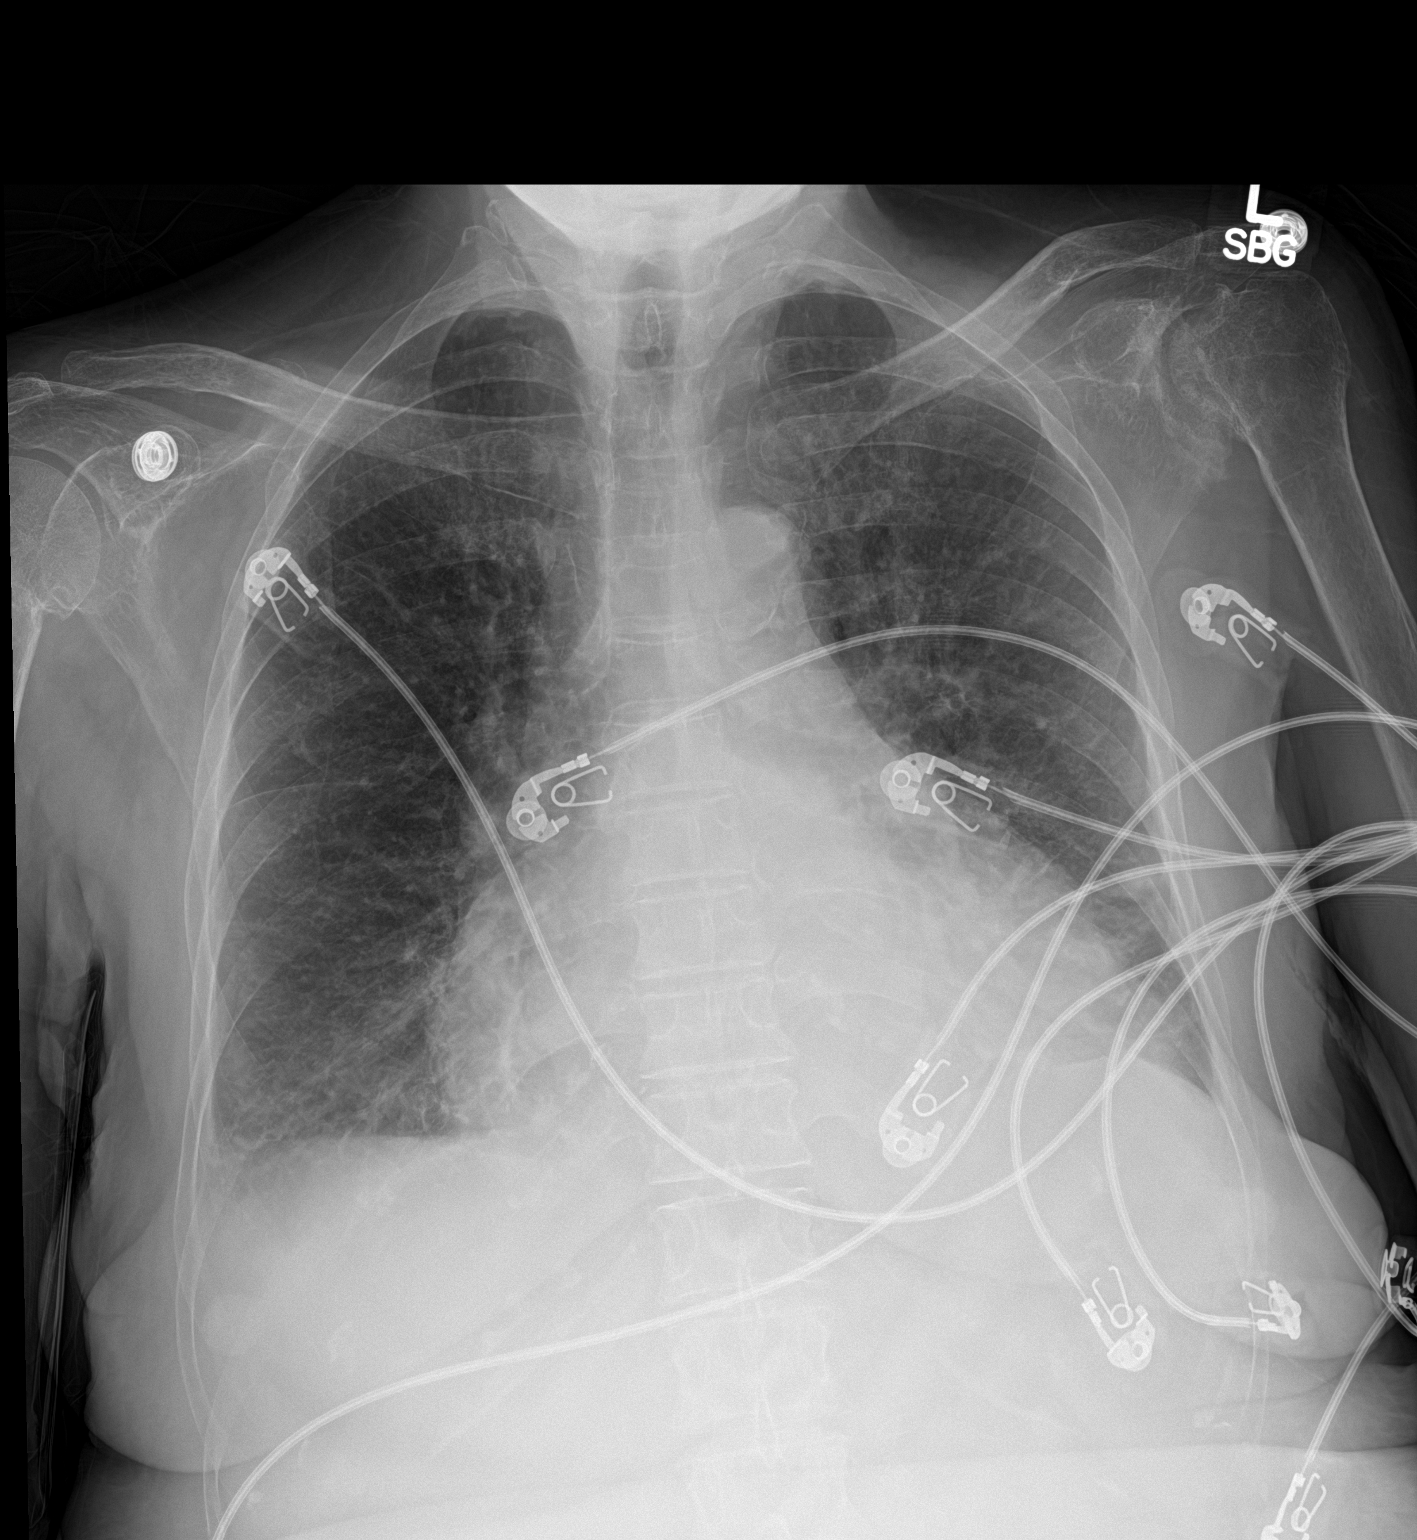

[1 of 1 positions shown; findings below may reference images not displayed]

FINDINGS: Cardiac shadow remains enlarged. Aortic calcifications are again
seen. The lungs are well aerated bilaterally without focal
infiltrate some chronic changes in the left base are seen. No
sizable effusion is noted. Nipple shadows are seen. Degenerative
changes in the left shoulder joint are seen.
IMPRESSION: Stable changes in the left base.  No acute abnormality noted.
